# Patient Record
Sex: Male | Born: 1945 | Race: White | Hispanic: No | Marital: Single | Smoking: Former smoker
Health system: Southern US, Community
[De-identification: ages and names within clinical notes are randomized; demographics above are authoritative.]

## PROBLEM LIST (undated history)

## (undated) DIAGNOSIS — I2510 Atherosclerotic heart disease of native coronary artery without angina pectoris: Secondary | ICD-10-CM

## (undated) DIAGNOSIS — K5792 Diverticulitis of intestine, part unspecified, without perforation or abscess without bleeding: Secondary | ICD-10-CM

---

## 2009-03-04 ENCOUNTER — Encounter (INDEPENDENT_AMBULATORY_CARE_PROVIDER_SITE_OTHER): Payer: Self-pay | Admitting: *Deleted

## 2009-08-07 ENCOUNTER — Telehealth: Payer: Self-pay | Admitting: Internal Medicine

## 2010-07-21 NOTE — Progress Notes (Signed)
Summary: Schedule Colonoscopy  Phone Note Outgoing Call   Call placed by: Hortense Ramal CMA Duncan Dull),  August 07, 2009 3:35 PM Call placed to: Patient Summary of Call: Patient is due for a colonoscopy due to his previous history of hyperplastic colonic polyps as well as diverticulosis. I have attempted to contact the patient, however, the numbers provided in EMR and IDX are incorrect. We will send a letter.  Initial call taken by: Hortense Ramal CMA Duncan Dull),  August 07, 2009 3:38 PM

## 2012-01-04 ENCOUNTER — Encounter: Payer: Self-pay | Admitting: Internal Medicine

## 2013-04-06 DIAGNOSIS — E785 Hyperlipidemia, unspecified: Secondary | ICD-10-CM | POA: Diagnosis present

## 2014-01-29 ENCOUNTER — Encounter: Payer: Self-pay | Admitting: Internal Medicine

## 2014-01-31 ENCOUNTER — Encounter: Payer: Self-pay | Admitting: Internal Medicine

## 2014-02-27 ENCOUNTER — Encounter: Payer: Self-pay | Admitting: Gastroenterology

## 2014-03-19 ENCOUNTER — Ambulatory Visit (AMBULATORY_SURGERY_CENTER): Payer: Medicare Other | Admitting: *Deleted

## 2014-03-19 VITALS — Ht 69.0 in | Wt 181.2 lb

## 2014-03-19 DIAGNOSIS — Z1211 Encounter for screening for malignant neoplasm of colon: Secondary | ICD-10-CM

## 2014-03-19 NOTE — Progress Notes (Signed)
No allergies to eggs or soy. No prior anesthesia.  Pt given Emmi instructions for colonoscopy  No oxygen use  No diet drug use  

## 2014-03-28 ENCOUNTER — Encounter: Payer: Self-pay | Admitting: Internal Medicine

## 2014-03-28 ENCOUNTER — Ambulatory Visit (AMBULATORY_SURGERY_CENTER): Payer: Medicare Other | Admitting: Internal Medicine

## 2014-03-28 VITALS — BP 151/77 | HR 60 | Temp 96.9°F | Resp 20 | Ht 69.0 in | Wt 181.0 lb

## 2014-03-28 DIAGNOSIS — D122 Benign neoplasm of ascending colon: Secondary | ICD-10-CM

## 2014-03-28 DIAGNOSIS — D124 Benign neoplasm of descending colon: Secondary | ICD-10-CM

## 2014-03-28 DIAGNOSIS — D123 Benign neoplasm of transverse colon: Secondary | ICD-10-CM

## 2014-03-28 DIAGNOSIS — Z1211 Encounter for screening for malignant neoplasm of colon: Secondary | ICD-10-CM

## 2014-03-28 DIAGNOSIS — K635 Polyp of colon: Secondary | ICD-10-CM

## 2014-03-28 MED ORDER — SODIUM CHLORIDE 0.9 % IV SOLN: 500.0000 mL | INTRAVENOUS | Status: DC

## 2014-03-28 NOTE — Op Note (Signed)
[place]  [address] [place], [id number]   COLONOSCOPY PROCEDURE REPORT  PATIENT: Franklin Ward, Franklin Ward  MR#: [id number] BIRTHDATE: [birth year] , 89  yrs. old GENDER: male ENDOSCOPIST: Eustace Quail, MD REFERRED [id number] Kenton Kingfisher, M.D. PROCEDURE DATE:  03/28/2014 PROCEDURE:   Colonoscopy with snare polypectomy x4 First Screening Colonoscopy - Avg.  risk and is 50 yrs.  old or older - No.  Prior Negative Screening - Now for repeat screening. 10 or more years since last screening  History of Adenoma - Now for follow-up colonoscopy & has been > or = to 3 yrs.  N/A  Polyps Removed Today? Yes. ASA CLASS:   Class III INDICATIONS:average risk for colorectal cancer. Next exam 2005 negative (hyperplastic polyp only). MEDICATIONS: Monitored anesthesia care and Propofol 300 mg IV  DESCRIPTION OF PROCEDURE:   After the risks benefits and alternatives of the procedure were thoroughly explained, informed consent was obtained.  The digital rectal exam revealed no abnormalities of the rectum.   The LB [id number]  endoscope was introduced through the anus and advanced to the cecum, which was identified by both the appendix and ileocecal valve. No adverse events experienced.   The quality of the prep was good, using MoviPrep  The instrument was then slowly withdrawn as the colon was fully examined.  COLON FINDINGS: Four polyps ranging between 3-38mm in size were found in the descending colon, transverse (2), and ascending colon.  A polypectomy was performed with a cold snare.  The resection was complete, the polyp tissue was completely retrieved and sent to histology.   There was severe diverticulosis noted in the left colon.   The examination was otherwise normal.  Retroflexed views revealed internal hemorrhoids. The time to cecum=3 minutes 44 seconds.  Withdrawal time=14 minutes 20 seconds.  The scope was withdrawn and the procedure completed. COMPLICATIONS: There  were no immediate complications.  ENDOSCOPIC IMPRESSION: 1.   Four polyps were in the descending, transverse, and ascending colon; polypectomy was performed with a cold snare 2.   Severe diverticulosis was noted in the left colon 3.   The examination was otherwise normal  RECOMMENDATIONS: 1. Repeat Colonoscopy in 3 years (3 or more adenomas).  eSigned:  Eustace Quail, MD 03/28/2014 1:12 PM   cc: Shirline Frees MD and The Patient

## 2014-03-28 NOTE — Progress Notes (Addendum)
1334 pt. Stated that he is having trouble passing air Dr. Henrene Pastor at bedside hyoscyamine 0.125 mg give after repositioning and massaging abdomen.  1340. Pt. Expelled small amount of air,stated that it is cramping down in the bottom of abdomen, ambulated pt within unit and then taken to bathroom and wife with pt. Await response.  1406.ambulated pt. Within unit and coffee given, await response.pt. 1418 pt. And wife stated that he just took 2  Gas-x. Pt. Taken back to bathroom to attempt to expel air.pt. Abdomen soft and non distended. Asked pt. To rate abdominal discomfort at 1340,pt". Stated that he is unable to rate discomfort,you just know that it is there".   39 Spoke with Dr. Henrene Pastor and made him aware of all the above information including the information that pt. Stated that the last time he had this procedure he had difficulty expelling air and that he went home and took Gas-X and once he started expelling the air he was okay. Dr. Henrene Pastor instructed me to discharge pt. And inform him not to over eat and he could continue to take Gas-X at home to help. Pt. Verbalize understanding, also instructed pt. To call with any worsening of pain and swelling of abdomen.

## 2014-03-28 NOTE — Progress Notes (Signed)
Called to room to assist during endoscopic procedure.  Patient ID and intended procedure confirmed with present staff. Received instructions for my participation in the procedure from the performing physician.  

## 2014-03-28 NOTE — Progress Notes (Signed)
A/ox3 pleased with MAC, report to Sheila RN 

## 2014-03-28 NOTE — Patient Instructions (Signed)
YOU HAD AN ENDOSCOPIC PROCEDURE TODAY AT THE [facility]: Refer to the procedure report that was given to you for any specific questions about what was found during the examination.  If the procedure report does not answer your questions, please call your gastroenterologist to clarify.  If you requested that your care partner not be given the details of your procedure findings, then the procedure report has been included in a sealed envelope for you to review at your convenience later.  YOU SHOULD EXPECT: Some feelings of bloating in the abdomen. Passage of more gas than usual.  Walking can help get rid of the air that was put into your GI tract during the procedure and reduce the bloating. If you had a lower endoscopy (such as a colonoscopy or flexible sigmoidoscopy) you may notice spotting of blood in your stool or on the toilet paper. If you underwent a bowel prep for your procedure, then you may not have a normal bowel movement for a few days.  DIET: Your first meal following the procedure should be a light meal and then it is ok to progress to your normal diet.  A half-sandwich or bowl of soup is an example of a good first meal.  Heavy or fried foods are harder to digest and may make you feel nauseous or bloated.  Likewise meals heavy in dairy and vegetables can cause extra gas to form and this can also increase the bloating.  Drink plenty of fluids but you should avoid alcoholic beverages for 24 hours.  ACTIVITY: Your care partner should take you home directly after the procedure.  You should plan to take it easy, moving slowly for the rest of the day.  You can resume normal activity the day after the procedure however you should NOT DRIVE or use heavy machinery for 24 hours (because of the sedation medicines used during the test).    SYMPTOMS TO REPORT IMMEDIATELY: A gastroenterologist can be reached at any hour.  During normal business hours, 8:30 AM to 5:00 PM Monday through Friday,  call [phone].  After hours and on weekends, please call the GI answering service at [phone] who will take a message and have the physician on call contact you.   Following lower endoscopy (colonoscopy or flexible sigmoidoscopy):  Excessive amounts of blood in the stool  Significant tenderness or worsening of abdominal pains  Swelling of the abdomen that is new, acute  Fever of 100F or higher   FOLLOW UP: If any biopsies were taken you will be contacted by phone or by letter within the next 1-3 weeks.  Call your gastroenterologist if you have not heard about the biopsies in 3 weeks.  Our staff will call the home number listed on your records the next business day following your procedure to check on you and address any questions or concerns that you may have at that time regarding the information given to you following your procedure. This is a courtesy call and so if there is no answer at the home number and we have not heard from you through the emergency physician on call, we will assume that you have returned to your regular daily activities without incident.  SIGNATURES/CONFIDENTIALITY: You and/or your care partner have signed paperwork which will be entered into your electronic medical record.  These signatures attest to the fact that that the information above on your After Visit Summary has been reviewed and is understood.  Full responsibility of the confidentiality of   this discharge information lies with you and/or your care-partner.   Resume medications. Information given on polyps,diverticulosis and high fiber diet with discharge instructions. 

## 2014-03-29 ENCOUNTER — Telehealth: Payer: Self-pay

## 2014-03-29 NOTE — Telephone Encounter (Signed)
  Follow up Call-  Call back number 03/28/2014  Post procedure Call Back phone  # [phone]  Permission to leave phone message Yes     Patient questions:  Do you have a fever, pain , or abdominal swelling? No. Pain Score  0 *  Have you tolerated food without any problems? Yes.    Have you been able to return to your normal activities? Yes.    Do you have any questions about your discharge instructions: Diet   No. Medications  No. Follow up visit  No.  Do you have questions or concerns about your Care? No.  Actions: * If pain score is 4 or above: No action needed, pain <4.

## 2014-04-03 ENCOUNTER — Encounter: Payer: Self-pay | Admitting: Internal Medicine

## 2016-07-06 ENCOUNTER — Encounter (HOSPITAL_COMMUNITY): Payer: Self-pay

## 2016-07-06 ENCOUNTER — Ambulatory Visit
Admission: RE | Admit: 2016-07-06 | Discharge: 2016-07-06 | Disposition: A | Discharge status: Home / Self Care | Payer: Medicare Other | Source: Ambulatory Visit | Attending: Family Medicine | Admitting: Family Medicine

## 2016-07-06 ENCOUNTER — Other Ambulatory Visit: Payer: Self-pay | Admitting: Family Medicine

## 2016-07-06 ENCOUNTER — Inpatient Hospital Stay (HOSPITAL_COMMUNITY)
Admission: EM | Admit: 2016-07-06 | Discharge: 2016-07-10 | DRG: 392 | Disposition: A | Discharge status: Home / Self Care | Payer: Medicare Other | Attending: Surgery | Admitting: Surgery

## 2016-07-06 DIAGNOSIS — Z7982 Long term (current) use of aspirin: Secondary | ICD-10-CM

## 2016-07-06 DIAGNOSIS — K5720 Diverticulitis of large intestine with perforation and abscess without bleeding: Secondary | ICD-10-CM | POA: Diagnosis not present

## 2016-07-06 DIAGNOSIS — Z801 Family history of malignant neoplasm of trachea, bronchus and lung: Secondary | ICD-10-CM

## 2016-07-06 DIAGNOSIS — R1030 Lower abdominal pain, unspecified: Secondary | ICD-10-CM

## 2016-07-06 DIAGNOSIS — K5780 Diverticulitis of intestine, part unspecified, with perforation and abscess without bleeding: Secondary | ICD-10-CM | POA: Diagnosis present

## 2016-07-06 DIAGNOSIS — K5900 Constipation, unspecified: Secondary | ICD-10-CM | POA: Diagnosis present

## 2016-07-06 DIAGNOSIS — F17220 Nicotine dependence, chewing tobacco, uncomplicated: Secondary | ICD-10-CM | POA: Diagnosis present

## 2016-07-06 LAB — CBC WITH DIFFERENTIAL/PLATELET
Basophils Absolute: 0 10*3/uL (ref 0.0–0.1)
Basophils Relative: 0 %
EOS ABS: 0.1 10*3/uL (ref 0.0–0.7)
Eosinophils Relative: 1 %
HEMATOCRIT: 39.1 % (ref 39.0–52.0)
HEMOGLOBIN: 12.9 g/dL — AB (ref 13.0–17.0)
LYMPHS ABS: 2 10*3/uL (ref 0.7–4.0)
Lymphocytes Relative: 12 %
MCH: 29.4 pg (ref 26.0–34.0)
MCHC: 33 g/dL (ref 30.0–36.0)
MCV: 89.1 fL (ref 78.0–100.0)
MONOS PCT: 9 %
Monocytes Absolute: 1.4 10*3/uL — ABNORMAL HIGH (ref 0.1–1.0)
NEUTROS PCT: 78 %
Neutro Abs: 12.5 10*3/uL — ABNORMAL HIGH (ref 1.7–7.7)
Platelets: 372 10*3/uL (ref 150–400)
RBC: 4.39 MIL/uL (ref 4.22–5.81)
RDW: 13.6 % (ref 11.5–15.5)
WBC: 15.9 10*3/uL — ABNORMAL HIGH (ref 4.0–10.5)

## 2016-07-06 LAB — ABO/RH: ABO/RH(D): A POS

## 2016-07-06 LAB — COMPREHENSIVE METABOLIC PANEL
ALK PHOS: 52 U/L (ref 38–126)
ALT: 21 U/L (ref 17–63)
ANION GAP: 9 (ref 5–15)
AST: 25 U/L (ref 15–41)
Albumin: 2.7 g/dL — ABNORMAL LOW (ref 3.5–5.0)
BILIRUBIN TOTAL: 0.3 mg/dL (ref 0.3–1.2)
BUN: 13 mg/dL (ref 6–20)
CALCIUM: 9.2 mg/dL (ref 8.9–10.3)
CO2: 23 mmol/L (ref 22–32)
CREATININE: 0.8 mg/dL (ref 0.61–1.24)
Chloride: 102 mmol/L (ref 101–111)
Glucose, Bld: 108 mg/dL — ABNORMAL HIGH (ref 65–99)
Potassium: 4.5 mmol/L (ref 3.5–5.1)
SODIUM: 134 mmol/L — AB (ref 135–145)
Total Protein: 7.5 g/dL (ref 6.5–8.1)

## 2016-07-06 LAB — TYPE AND SCREEN
ABO/RH(D): A POS
Antibody Screen: NEGATIVE

## 2016-07-06 LAB — I-STAT CG4 LACTIC ACID, ED
LACTIC ACID, VENOUS: 2.02 mmol/L — AB (ref 0.5–1.9)
Lactic Acid, Venous: 0.85 mmol/L (ref 0.5–1.9)

## 2016-07-06 MED ORDER — SODIUM CHLORIDE 0.9 % IV BOLUS (SEPSIS)
1000.0000 mL | Freq: Once | INTRAVENOUS | Status: AC
  Administered 2016-07-06: 1000 mL via INTRAVENOUS

## 2016-07-06 MED ORDER — PIPERACILLIN-TAZOBACTAM 3.375 G IVPB
3.3750 g | Freq: Three times a day (TID) | INTRAVENOUS | Status: DC
  Administered 2016-07-07 – 2016-07-10 (×10): 3.375 g via INTRAVENOUS
  Filled 2016-07-06 (×12): qty 50

## 2016-07-06 MED ORDER — SODIUM CHLORIDE 0.9 % IV SOLN
Freq: Once | INTRAVENOUS | Status: AC
Start: 2016-07-06 — End: 2016-07-06
  Administered 2016-07-06: 18:00:00 via INTRAVENOUS

## 2016-07-06 MED ORDER — PIPERACILLIN-TAZOBACTAM 3.375 G IVPB 30 MIN
3.3750 g | Freq: Once | INTRAVENOUS | Status: AC
  Administered 2016-07-06: 3.375 g via INTRAVENOUS
  Filled 2016-07-06: qty 50

## 2016-07-06 MED ORDER — IOPAMIDOL (ISOVUE-300) INJECTION 61%
100.0000 mL | Freq: Once | INTRAVENOUS | Status: AC | PRN
  Administered 2016-07-06: 100 mL via INTRAVENOUS

## 2016-07-06 NOTE — H&P (Signed)
Franklin Ward is an 71 y.o. male.   Chief Complaint: Lower abdominal pain HPI: This is a 71 yo male in good health who presents with a six week history of difficulty with bowel movements, followed by a couple of weeks of diarrhea.  He has felt a lot of pressure in his lower abdomen that is relieved by bowel movements.  His bowel movements have been fairly small over the last couple of week.  No melena or hematochezia.  Some subjective fever/ chills.  No nausea or vomiting.  PCP ordered CT scan which showed diverticulitis with abscess so the patient was instructed to go to the ED.  History reviewed. No pertinent past medical history.  Past Surgical History:  Procedure Laterality Date  . NO PAST SURGERIES      Family History  Problem Relation Age of Onset  . Lung cancer Father   . Colon cancer Neg Hx    Social History:  reports that he quit smoking about 36 years ago. His smokeless tobacco use includes Chew. He reports that he does not drink alcohol or use drugs.  Allergies:  Allergies  Allergen Reactions  . Tetracyclines & Related Rash    Prior to Admission medications   Medication Sig Start Date End Date Taking? Authorizing Provider  Ascorbic Acid (VITAMIN C PO) Take 1,000 mg by mouth daily.   Yes Historical Provider, MD  aspirin 81 MG tablet Take 81 mg by mouth daily.   Yes Historical Provider, MD  Calcium Carb-Cholecalciferol (CALCIUM 600 + D PO) Take 1 tablet by mouth daily.    Yes Historical Provider, MD  ciprofloxacin (CIPRO) 500 MG tablet Take 500 mg by mouth 2 (two) times daily. 07/06/16  Yes Historical Provider, MD  Cyanocobalamin (B-12) 2500 MCG TABS Take 1 tablet by mouth daily.    Yes Historical Provider, MD  Garlic 0258 MG CAPS Take 1 capsule by mouth daily.    Yes Historical Provider, MD  metroNIDAZOLE (FLAGYL) 500 MG tablet Take 500 mg by mouth 2 (two) times daily. 07/06/16  Yes Historical Provider, MD  Misc Natural Products (OSTEO BI-FLEX ADV TRIPLE ST PO) Take 1  tablet by mouth 2 (two) times daily.    Yes Historical Provider, MD  Multiple Vitamin (MULTIVITAMIN) tablet Take 1 tablet by mouth daily.   Yes Historical Provider, MD  Omega-3 Fatty Acids (OMEGA-3 FISH OIL PO) Take 1 capsule by mouth daily.   Yes Historical Provider, MD  pseudoephedrine-guaifenesin (MUCINEX D) 60-600 MG 12 hr tablet Take 1 tablet by mouth every 12 (twelve) hours as needed for congestion.   Yes Historical Provider, MD  Psyllium (METAMUCIL FIBER PO) Take by mouth See admin instructions. Mix 1 teaspoonful of powder into a glass of water and drink two to three times a day   Yes Historical Provider, MD  Turmeric 500 MG CAPS Take 500 mg by mouth daily.    Yes Historical Provider, MD  vitamin E 400 UNIT capsule Take 400 Units by mouth daily.   Yes Historical Provider, MD     Results for orders placed or performed during the hospital encounter of 07/06/16 (from the past 48 hour(s))  CBC with Differential     Status: Abnormal   Collection Time: 07/06/16  5:22 PM  Result Value Ref Range   WBC 15.9 (H) 4.0 - 10.5 K/uL   RBC 4.39 4.22 - 5.81 MIL/uL   Hemoglobin 12.9 (L) 13.0 - 17.0 g/dL   HCT 39.1 39.0 - 52.0 %   MCV  89.1 78.0 - 100.0 fL   MCH 29.4 26.0 - 34.0 pg   MCHC 33.0 30.0 - 36.0 g/dL   RDW 13.6 11.5 - 15.5 %   Platelets 372 150 - 400 K/uL   Neutrophils Relative % 78 %   Neutro Abs 12.5 (H) 1.7 - 7.7 K/uL   Lymphocytes Relative 12 %   Lymphs Abs 2.0 0.7 - 4.0 K/uL   Monocytes Relative 9 %   Monocytes Absolute 1.4 (H) 0.1 - 1.0 K/uL   Eosinophils Relative 1 %   Eosinophils Absolute 0.1 0.0 - 0.7 K/uL   Basophils Relative 0 %   Basophils Absolute 0.0 0.0 - 0.1 K/uL  Comprehensive metabolic panel     Status: Abnormal   Collection Time: 07/06/16  5:22 PM  Result Value Ref Range   Sodium 134 (L) 135 - 145 mmol/L   Potassium 4.5 3.5 - 5.1 mmol/L   Chloride 102 101 - 111 mmol/L   CO2 23 22 - 32 mmol/L   Glucose, Bld 108 (H) 65 - 99 mg/dL   BUN 13 6 - 20 mg/dL    Creatinine, Ser 0.80 0.61 - 1.24 mg/dL   Calcium 9.2 8.9 - 10.3 mg/dL   Total Protein 7.5 6.5 - 8.1 g/dL   Albumin 2.7 (L) 3.5 - 5.0 g/dL   AST 25 15 - 41 U/L   ALT 21 17 - 63 U/L   Alkaline Phosphatase 52 38 - 126 U/L   Total Bilirubin 0.3 0.3 - 1.2 mg/dL   GFR calc non Af Amer >60 >60 mL/min   GFR calc Af Amer >60 >60 mL/min    Comment: (NOTE) The eGFR has been calculated using the CKD EPI equation. This calculation has not been validated in all clinical situations. eGFR's persistently <60 mL/min signify possible Chronic Kidney Disease.    Anion gap 9 5 - 15  Type and screen [place]     Status: None   Collection Time: 07/06/16  5:22 PM  Result Value Ref Range   ABO/RH(D) A POS    Antibody Screen NEG    Sample Expiration 07/09/2016   ABO/Rh     Status: None   Collection Time: 07/06/16  5:22 PM  Result Value Ref Range   ABO/RH(D) A POS   I-Stat CG4 Lactic Acid, ED     Status: Abnormal   Collection Time: 07/06/16  5:44 PM  Result Value Ref Range   Lactic Acid, Venous 2.02 (HH) 0.5 - 1.9 mmol/L   Comment NOTIFIED PHYSICIAN    Ct Abdomen Pelvis W Contrast  Result Date: 07/06/2016 CLINICAL DATA:  71 year old male with lower abdominal pain most severe on the left side for the past 6 weeks. EXAM: CT ABDOMEN AND PELVIS WITH CONTRAST TECHNIQUE: Multidetector CT imaging of the abdomen and pelvis was performed using the standard protocol following bolus administration of intravenous contrast. CONTRAST:  140m ISOVUE-300 IOPAMIDOL (ISOVUE-300) INJECTION 61% COMPARISON:  No priors. FINDINGS: Lower chest: Atherosclerotic calcifications in the left anterior descending, left circumflex and right coronary arteries. Hepatobiliary: Multiple subcentimeter low-attenuation lesions are noted in the liver, too small to characterize, but statistically likely to represent tiny cysts. No larger more suspicious appearing hepatic lesions are noted. No intra or extrahepatic biliary ductal  dilatation. Gallbladder is normal in appearance. Pancreas: No pancreatic mass. No pancreatic ductal dilatation. No pancreatic or peripancreatic fluid or inflammatory changes. Spleen: Unremarkable. Adrenals/Urinary Tract: In the lower pole collecting system of the left kidney there is a 7 mm  nonobstructive calculus. No additional calculi are noted along the course of either ureter or within the lumen of the urinary bladder. There is no hydroureteronephrosis to indicate urinary tract obstruction at this time. Multiple subcentimeter low-attenuation lesions in the kidneys bilaterally are too small to characterize, but statistically likely to represent cysts. Urinary bladder is nearly completely decompressed, but otherwise unremarkable in appearance. Bilateral adrenal glands are normal in appearance. Stomach/Bowel: The appearance of the stomach is normal. There is no pathologic dilatation of small bowel or colon. 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 Lateral to the mid sigmoid colon on the right side there is a 2.1 x 3.1 x 4.6 cm centrally low-intermediate attenuation rim enhancing fluid collection, concerning for a diverticular abscess (axial image 68 of series 2 and coronal image 55 of series 3). The appendix is not confidently identified and may be surgically absent. Regardless, there are no inflammatory changes noted adjacent to the cecum to suggest the presence of an acute appendicitis at this time. Vascular/Lymphatic: Aortic atherosclerosis, without evidence of aneurysm or dissection in the abdominal or pelvic vasculature. No lymphadenopathy noted in the abdomen or pelvis. Reproductive: Prostate gland and seminal vesicles are unremarkable in appearance. Other: Small volume of free fluid in the low anatomic pelvis. No pneumoperitoneum. Musculoskeletal:  There are no aggressive appearing lytic or blastic lesions noted in the visualized portions of the skeleton. IMPRESSION: 1. Acute sigmoid diverticulitis with possible diverticular abscess in the sigmoid mesocolon, as detailed above. There is also small volume of free fluid in the low anatomic pelvis. The possibility of frank perforation is not excluded, but is not strongly favored at this time. Surgical consultation is recommended. 2. Aortic atherosclerosis, in addition to 3 vessel coronary artery disease. Assessment for potential risk factor modification, dietary therapy or pharmacologic therapy may be warranted, if clinically indicated. 3. 7 mm nonobstructive calculus in the lower pole collecting system of left kidney. No ureteral stones or findings to suggest urinary tract obstruction at this time. 4. Additional incidental findings, as above. These results will be called to the ordering clinician or representative by the Radiologist Assistant, and communication documented in the PACS or zVision Dashboard. Electronically Signed   By: Vinnie Langton M.D.   On: 07/06/2016 13:28    Review of Systems  Constitutional: Negative for weight loss.  HENT: Negative for ear discharge, ear pain, hearing loss and tinnitus.   Eyes: Negative for blurred vision, double vision, photophobia and pain.  Respiratory: Negative for cough, sputum production and shortness of breath.   Cardiovascular: Negative for chest pain.  Gastrointestinal: Positive for abdominal pain, constipation and diarrhea. Negative for nausea and vomiting.  Genitourinary: Negative for dysuria, flank pain, frequency and urgency.  Musculoskeletal: Negative for back pain, falls, joint pain, myalgias and neck pain.  Neurological: Negative for dizziness, tingling, sensory change, focal weakness, loss of consciousness and headaches.  Endo/Heme/Allergies: Does not bruise/bleed easily.  Psychiatric/Behavioral: Negative for depression, memory loss and  substance abuse. The patient is not nervous/anxious.     Blood pressure 131/69, pulse 92, temperature 97.9 F (36.6 C), temperature source Oral, resp. rate 18, height '5\' 9"'$  (1.753 m), weight 84.8 kg (187 lb), SpO2 98 %. Physical Exam  WDWN in NAD Eyes:  Pupils equal, round; sclera anicteric HENT:  Oral mucosa moist; good dentition  Neck:  No masses palpated, no thyromegaly Lungs:  CTA bilaterally; normal  respiratory effort CV:  Regular rate and rhythm; no murmurs; extremities well-perfused with no edema Abd:  +bowel sounds,mildly distended; tender in bilateral lower quadrants; no palpable masses Skin:  Warm, dry; no sign of jaundice Psychiatric - alert and oriented x 4; calm mood and affect  Assessment/Plan Sigmoid diverticulitis with abscess in the sigmoid mesocolon  Admit for IV abx, bowel rest Discussed the plan with the patient, including the possible need for emergent surgery if he worsens, including the possibility of temporary colostomy.  His family is also present.    Maia Petties., MD 07/06/2016, 7:03 PM

## 2016-07-06 NOTE — Progress Notes (Signed)
Pharmacy Antibiotic Note  Franklin Ward is a 71 y.o. male admitted on 07/06/2016 with perforated diverticulitis.  Pharmacy has been consulted for zosyn dosing.  Patient received zosyn 3.375g IV once in the ED.  Plan: Zosyn 3.375g IV q8h (4 hour infusion).  Monitor culture data, renal function and clinical course  Height: 5\' 9"  (175.3 cm) Weight: 187 lb (84.8 kg) IBW/kg (Calculated) : 70.7  Temp (24hrs), Avg:97.9 F (36.6 C), Min:97.9 F (36.6 C), Max:97.9 F (36.6 C)  No results for input(s): WBC, CREATININE, LATICACIDVEN, VANCOTROUGH, VANCOPEAK, VANCORANDOM, GENTTROUGH, GENTPEAK, GENTRANDOM, TOBRATROUGH, TOBRAPEAK, TOBRARND, AMIKACINPEAK, AMIKACINTROU, AMIKACIN in the last 168 hours.  CrCl cannot be calculated (No order found.).    Allergies  Allergen Reactions  . Tetracyclines & Related Rash     Andrey Cota. Diona Foley, PharmD,  Clinical Pharmacist Pager [phone] 07/06/2016 4:40 PM

## 2016-07-06 NOTE — ED Notes (Signed)
Pt ambulated to the BR with steady gait.  Still waiting for a bed assignment

## 2016-07-06 NOTE — ED Notes (Signed)
Pt is resting comfortably with family at bedside.  Denies any complaints at this time.  Waiting for a bed assignment.

## 2016-07-06 NOTE — ED Notes (Signed)
Pt is resting with family at bedside.  Reason for delay explained to pt.  Pt ambulated to the BR with steady gait.

## 2016-07-06 NOTE — ED Triage Notes (Addendum)
Pt. Was sent to [place] by [facility] Dr. Kenton Kingfisher, with an abscess of diverticulum.  Pt. Having soft stool. Denies any pain.  These symptoms began over 6 weeks ago.  Pt. Does have cold sweats at night.  Alert and oriented X4.  Skin is warm and pink and dry. Pt. Was placed on antibiotics today.  He was sent by [facility] to see a Psychologist, sport and exercise.

## 2016-07-06 NOTE — ED Provider Notes (Signed)
[facility] [place] DEPT Provider Note   CSN: [id number] Arrival date & time: 07/06/16  1524     History   Chief Complaint Chief Complaint  Patient presents with  . Abnormal Lab    HPI Franklin Ward is a 71 y.o. male.  HPI 71 year old male with no significant past medical history here with lower abdominal pain. Patient states the last 6 weeks he has had aching, gnawing, lower abdominal pain. His symptoms started initially with constipation followed by profuse, watery diarrhea. Over the last several days his pain is worsened and he is having increasing urge to go to the bathroom although he only produces a small amount of stool. Denies any blood in stool. He has had some subjective fevers and chills. No vomiting. He went to his primary care doctor today who sent him to a CT scan which showed perforated diverticulitis. He was subsequently sent here for evaluation.  History reviewed. No pertinent past medical history.  There are no active problems to display for this patient.   Past Surgical History:  Procedure Laterality Date  . NO PAST SURGERIES         Home Medications    Prior to Admission medications   Medication Sig Start Date End Date Taking? Authorizing Provider  Ascorbic Acid (VITAMIN C PO) Take 1,000 mg by mouth daily.   Yes Historical Provider, MD  aspirin 81 MG tablet Take 81 mg by mouth daily.   Yes Historical Provider, MD  Calcium Carb-Cholecalciferol (CALCIUM 600 + D PO) Take 1 tablet by mouth daily.    Yes Historical Provider, MD  ciprofloxacin (CIPRO) 500 MG tablet Take 500 mg by mouth 2 (two) times daily. 07/06/16  Yes Historical Provider, MD  Cyanocobalamin (B-12) 2500 MCG TABS Take 1 tablet by mouth daily.    Yes Historical Provider, MD  Garlic 123XX123 MG CAPS Take 1 capsule by mouth daily.    Yes Historical Provider, MD  metroNIDAZOLE (FLAGYL) 500 MG tablet Take 500 mg by mouth 2 (two) times daily. 07/06/16  Yes Historical Provider, MD  Misc Natural Products  (OSTEO BI-FLEX ADV TRIPLE ST PO) Take 1 tablet by mouth 2 (two) times daily.    Yes Historical Provider, MD  Multiple Vitamin (MULTIVITAMIN) tablet Take 1 tablet by mouth daily.   Yes Historical Provider, MD  Omega-3 Fatty Acids (OMEGA-3 FISH OIL PO) Take 1 capsule by mouth daily.   Yes Historical Provider, MD  pseudoephedrine-guaifenesin (MUCINEX D) 60-600 MG 12 hr tablet Take 1 tablet by mouth every 12 (twelve) hours as needed for congestion.   Yes Historical Provider, MD  Psyllium (METAMUCIL FIBER PO) Take by mouth See admin instructions. Mix 1 teaspoonful of powder into a glass of water and drink two to three times a day   Yes Historical Provider, MD  Turmeric 500 MG CAPS Take 500 mg by mouth daily.    Yes Historical Provider, MD  vitamin E 400 UNIT capsule Take 400 Units by mouth daily.   Yes Historical Provider, MD    Family History Family History  Problem Relation Age of Onset  . Lung cancer Father   . Colon cancer Neg Hx     Social History Social History  Substance Use Topics  . Smoking status: Former Smoker    Quit date: 06/21/1980  . Smokeless tobacco: Current User    Types: Chew  . Alcohol use No     Allergies   Tetracyclines & related   Review of Systems Review of Systems  Constitutional:  Positive for fatigue. Negative for chills and fever.  HENT: Negative for congestion and rhinorrhea.   Eyes: Negative for visual disturbance.  Respiratory: Negative for cough, shortness of breath and wheezing.   Cardiovascular: Negative for chest pain and leg swelling.  Gastrointestinal: Positive for abdominal pain and diarrhea. Negative for nausea and vomiting.  Genitourinary: Negative for dysuria and flank pain.  Musculoskeletal: Negative for neck pain and neck stiffness.  Skin: Negative for rash and wound.  Allergic/Immunologic: Negative for immunocompromised state.  Neurological: Negative for syncope, weakness and headaches.  All other systems reviewed and are  negative.    Physical Exam Updated Vital Signs BP 142/83   Pulse 89   Temp 97.9 F (36.6 C) (Oral)   Resp 18   Ht 5\' 9"  (1.753 m)   Wt 187 lb (84.8 kg)   SpO2 95%   BMI 27.62 kg/m   Physical Exam  Constitutional: He is oriented to person, place, and time. He appears well-developed and well-nourished. No distress.  HENT:  Head: Normocephalic and atraumatic.  Mouth/Throat: Oropharynx is clear and moist. No oropharyngeal exudate.  Eyes: Conjunctivae are normal.  Neck: Neck supple.  Cardiovascular: Normal rate, regular rhythm and normal heart sounds.  Exam reveals no friction rub.   No murmur heard. Pulmonary/Chest: Effort normal and breath sounds normal. No respiratory distress. He has no wheezes. He has no rales.  Abdominal: He exhibits no distension. There is tenderness in the right lower quadrant, suprapubic area and left lower quadrant. There is no rigidity and no guarding.  Musculoskeletal: He exhibits no edema.  Neurological: He is alert and oriented to person, place, and time. He exhibits normal muscle tone.  Skin: Skin is warm. Capillary refill takes less than 2 seconds.  Psychiatric: He has a normal mood and affect.  Nursing note and vitals reviewed.    ED Treatments / Results  Labs (all labs ordered are listed, but only abnormal results are displayed) Labs Reviewed  CBC WITH DIFFERENTIAL/PLATELET - Abnormal; Notable for the following:       Result Value   WBC 15.9 (*)    Hemoglobin 12.9 (*)    Neutro Abs 12.5 (*)    Monocytes Absolute 1.4 (*)    All other components within normal limits  COMPREHENSIVE METABOLIC PANEL - Abnormal; Notable for the following:    Sodium 134 (*)    Glucose, Bld 108 (*)    Albumin 2.7 (*)    All other components within normal limits  I-STAT CG4 LACTIC ACID, ED - Abnormal; Notable for the following:    Lactic Acid, Venous 2.02 (*)    All other components within normal limits  CULTURE, BLOOD (ROUTINE X 2)  CULTURE, BLOOD (ROUTINE  X 2)  TYPE AND SCREEN    EKG  EKG Interpretation None       Radiology Ct Abdomen Pelvis W Contrast  Result Date: 07/06/2016 CLINICAL DATA:  71 year old male with lower abdominal pain most severe on the left side for the past 6 weeks. EXAM: CT ABDOMEN AND PELVIS WITH CONTRAST TECHNIQUE: Multidetector CT imaging of the abdomen and pelvis was performed using the standard protocol following bolus administration of intravenous contrast. CONTRAST:  173mL ISOVUE-300 IOPAMIDOL (ISOVUE-300) INJECTION 61% COMPARISON:  No priors. FINDINGS: Lower chest: Atherosclerotic calcifications in the left anterior descending, left circumflex and right coronary arteries. Hepatobiliary: Multiple subcentimeter low-attenuation lesions are noted in the liver, too small to characterize, but statistically likely to represent tiny cysts. No larger more suspicious appearing hepatic lesions  are noted. No intra or extrahepatic biliary ductal dilatation. Gallbladder is normal in appearance. Pancreas: No pancreatic mass. No pancreatic ductal dilatation. No pancreatic or peripancreatic fluid or inflammatory changes. Spleen: Unremarkable. Adrenals/Urinary Tract: In the lower pole collecting system of the left kidney there is a 7 mm nonobstructive calculus. No additional calculi are noted along the course of either ureter or within the lumen of the urinary bladder. There is no hydroureteronephrosis to indicate urinary tract obstruction at this time. Multiple subcentimeter low-attenuation lesions in the kidneys bilaterally are too small to characterize, but statistically likely to represent cysts. Urinary bladder is nearly completely decompressed, but otherwise unremarkable in appearance. Bilateral adrenal glands are normal in appearance. Stomach/Bowel: The appearance of the stomach is normal. There is no pathologic dilatation of small bowel or colon. 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 Lateral to the mid sigmoid colon on the right side there is a 2.1 x 3.1 x 4.6 cm centrally low-intermediate attenuation rim enhancing fluid collection, concerning for a diverticular abscess (axial image 68 of series 2 and coronal image 55 of series 3). The appendix is not confidently identified and may be surgically absent. Regardless, there are no inflammatory changes noted adjacent to the cecum to suggest the presence of an acute appendicitis at this time. Vascular/Lymphatic: Aortic atherosclerosis, without evidence of aneurysm or dissection in the abdominal or pelvic vasculature. No lymphadenopathy noted in the abdomen or pelvis. Reproductive: Prostate gland and seminal vesicles are unremarkable in appearance. Other: Small volume of free fluid in the low anatomic pelvis. No pneumoperitoneum. Musculoskeletal: There are no aggressive appearing lytic or blastic lesions noted in the visualized portions of the skeleton. IMPRESSION: 1. Acute sigmoid diverticulitis with possible diverticular abscess in the sigmoid mesocolon, as detailed above. There is also small volume of free fluid in the low anatomic pelvis. The possibility of frank perforation is not excluded, but is not strongly favored at this time. Surgical consultation is recommended. 2. Aortic atherosclerosis, in addition to 3 vessel coronary artery disease. Assessment for potential risk factor modification, dietary therapy or pharmacologic therapy may be warranted, if clinically indicated. 3. 7 mm nonobstructive calculus in the lower pole collecting system of left kidney. No ureteral stones or findings to suggest urinary tract obstruction at this time. 4. Additional incidental findings, as above. These results will be called to the ordering clinician or representative by the Radiologist Assistant, and communication documented in the PACS or zVision  Dashboard. Electronically Signed   By: Vinnie Langton M.D.   On: 07/06/2016 13:28    Procedures Procedures (including critical care time)  Medications Ordered in ED Medications  piperacillin-tazobactam (ZOSYN) IVPB 3.375 g (not administered)  sodium chloride 0.9 % bolus 1,000 mL (1,000 mLs Intravenous New Bag/Given 07/06/16 1723)  0.9 %  sodium chloride infusion ( Intravenous New Bag/Given 07/06/16 1730)     Initial Impression / Assessment and Plan / ED Course  I have reviewed the triage vital signs and the nursing notes.  Pertinent labs & imaging results that were available during my care of the patient were reviewed by me and considered in my medical decision making (see chart for details).  Clinical Course     71 year old male sent from PCP for perforated diverticulitis. Patient given IV Zosyn. Labwork overall reassuring without evidence of severe sepsis. Consult to surgery who will admit.  Final Clinical Impressions(s) /  ED Diagnoses   Final diagnoses:  Perforated diverticulum  Diverticulitis of large intestine with abscess without bleeding      Duffy Bruce, MD 07/06/16 [phone]

## 2016-07-07 LAB — BASIC METABOLIC PANEL
ANION GAP: 9 (ref 5–15)
BUN: 11 mg/dL (ref 6–20)
CALCIUM: 8.5 mg/dL — AB (ref 8.9–10.3)
CO2: 23 mmol/L (ref 22–32)
Chloride: 103 mmol/L (ref 101–111)
Creatinine, Ser: 0.86 mg/dL (ref 0.61–1.24)
GFR calc Af Amer: >60 mL/min (ref >60)
GLUCOSE: 95 mg/dL (ref 65–99)
Potassium: 4.1 mmol/L (ref 3.5–5.1)
Sodium: 135 mmol/L (ref 135–145)

## 2016-07-07 LAB — CBC
HCT: 37.8 % — ABNORMAL LOW (ref 39.0–52.0)
Hemoglobin: 12.3 g/dL — ABNORMAL LOW (ref 13.0–17.0)
MCH: 29 pg (ref 26.0–34.0)
MCHC: 32.5 g/dL (ref 30.0–36.0)
MCV: 89.2 fL (ref 78.0–100.0)
PLATELETS: 369 10*3/uL (ref 150–400)
RBC: 4.24 MIL/uL (ref 4.22–5.81)
RDW: 13.8 % (ref 11.5–15.5)
WBC: 18.4 10*3/uL — ABNORMAL HIGH (ref 4.0–10.5)

## 2016-07-07 MED ORDER — DIPHENHYDRAMINE HCL 50 MG/ML IJ SOLN: 12.5000 mg | Freq: Four times a day (QID) | INTRAMUSCULAR | Status: DC | PRN

## 2016-07-07 MED ORDER — ONDANSETRON HCL 4 MG/2ML IJ SOLN: 4.0000 mg | Freq: Four times a day (QID) | INTRAMUSCULAR | Status: DC | PRN

## 2016-07-07 MED ORDER — ENOXAPARIN SODIUM 40 MG/0.4ML ~~LOC~~ SOLN
40.0000 mg | SUBCUTANEOUS | Status: DC
  Administered 2016-07-07 – 2016-07-09 (×3): 40 mg via SUBCUTANEOUS
  Filled 2016-07-07 (×3): qty 0.4

## 2016-07-07 MED ORDER — MORPHINE SULFATE (PF) 2 MG/ML IV SOLN: 2.0000 mg | INTRAVENOUS | Status: DC | PRN

## 2016-07-07 MED ORDER — ZOLPIDEM TARTRATE 5 MG PO TABS: 5.0000 mg | ORAL_TABLET | Freq: Every evening | ORAL | Status: DC | PRN

## 2016-07-07 MED ORDER — POTASSIUM CHLORIDE IN NACL 20-0.9 MEQ/L-% IV SOLN
INTRAVENOUS | Status: DC
  Administered 2016-07-07 – 2016-07-08 (×5): via INTRAVENOUS
  Filled 2016-07-07 (×5): qty 1000

## 2016-07-07 MED ORDER — ONDANSETRON 4 MG PO TBDP: 4.0000 mg | ORAL_TABLET | Freq: Four times a day (QID) | ORAL | Status: DC | PRN

## 2016-07-07 MED ORDER — DIPHENHYDRAMINE HCL 12.5 MG/5ML PO ELIX: 12.5000 mg | ORAL_SOLUTION | Freq: Four times a day (QID) | ORAL | Status: DC | PRN

## 2016-07-07 NOTE — ED Notes (Signed)
Patient ambulatory to the restroom without difficulty.

## 2016-07-07 NOTE — Progress Notes (Signed)
[facility] Progress Note     Subjective: Pt with minimal abdominal pain. States he has had numerous BM's today. No blood in his stool. He states worsening abdominal pain before and during a BM then pain is relieved with a BM. He is not taking any pain medications. He denies nausea, vomiting, fevers.   Objective: Vital signs in last 24 hours: Temp:  [97.9 F (36.6 C)-99.1 F (37.3 C)] 97.9 F (36.6 C) (01/17 0621) Pulse Rate:  [85-104] 85 (01/17 0621) Resp:  [18] 18 (01/17 0621) BP: (127-160)/(61-92) 156/85 (01/17 0621) SpO2:  [94 %-99 %] 96 % (01/17 0621) Weight:  [187 lb (84.8 kg)-188 lb 14.4 oz (85.7 kg)] 188 lb 14.4 oz (85.7 kg) (01/17 0145) Last BM Date: 07/07/16  Intake/Output from previous day: 01/16 0701 - 01/17 0700 In: 1225 [I.V.:125; IV Piggyback:1100] Out: -  Intake/Output this shift: No intake/output data recorded.  PE: Gen:  Alert, NAD, pleasant, cooperative, lying in bed Card:  RRR, no M/G/R heard, 2 + radial pulses bilaterally Pulm:  CTA anteriorly, no W/R/R, effort and rate normal Abd: Soft, non distended, +BS, mild suprapubic tenderness, no abdominal scars noted Skin: no rashes noted, warm and dry  Lab Results:   Recent Labs  07/06/16 1722 07/07/16 0501  WBC 15.9* 18.4*  HGB 12.9* 12.3*  HCT 39.1 37.8*  PLT 372 369   BMET  Recent Labs  07/06/16 1722 07/07/16 0501  NA 134* 135  K 4.5 4.1  CL 102 103  CO2 23 23  GLUCOSE 108* 95  BUN 13 11  CREATININE 0.80 0.86  CALCIUM 9.2 8.5*   PT/INR No results for input(s): LABPROT, INR in the last 72 hours. CMP     Component Value Date/Time   NA 135 07/07/2016 0501   K 4.1 07/07/2016 0501   CL 103 07/07/2016 0501   CO2 23 07/07/2016 0501   GLUCOSE 95 07/07/2016 0501   BUN 11 07/07/2016 0501   CREATININE 0.86 07/07/2016 0501   CALCIUM 8.5 (L) 07/07/2016 0501   PROT 7.5 07/06/2016 1722   ALBUMIN 2.7 (L) 07/06/2016 1722   AST 25 07/06/2016 1722   ALT 21 07/06/2016 1722    ALKPHOS 52 07/06/2016 1722   BILITOT 0.3 07/06/2016 1722   GFRNONAA >60 07/07/2016 0501   GFRAA >60 07/07/2016 0501   Lipase  No results found for: LIPASE     Studies/Results: Ct Abdomen Pelvis W Contrast  Result Date: 07/06/2016 CLINICAL DATA:  71 year old male with lower abdominal pain most severe on the left side for the past 6 weeks. EXAM: CT ABDOMEN AND PELVIS WITH CONTRAST TECHNIQUE: Multidetector CT imaging of the abdomen and pelvis was performed using the standard protocol following bolus administration of intravenous contrast. CONTRAST:  161mL ISOVUE-300 IOPAMIDOL (ISOVUE-300) INJECTION 61% COMPARISON:  No priors. FINDINGS: Lower chest: Atherosclerotic calcifications in the left anterior descending, left circumflex and right coronary arteries. Hepatobiliary: Multiple subcentimeter low-attenuation lesions are noted in the liver, too small to characterize, but statistically likely to represent tiny cysts. No larger more suspicious appearing hepatic lesions are noted. No intra or extrahepatic biliary ductal dilatation. Gallbladder is normal in appearance. Pancreas: No pancreatic mass. No pancreatic ductal dilatation. No pancreatic or peripancreatic fluid or inflammatory changes. Spleen: Unremarkable. Adrenals/Urinary Tract: In the lower pole collecting system of the left kidney there is a 7 mm nonobstructive calculus. No additional calculi are noted along the course of either ureter or within the lumen of the urinary bladder. There is no hydroureteronephrosis to  indicate urinary tract obstruction at this time. Multiple subcentimeter low-attenuation lesions in the kidneys bilaterally are too small to characterize, but statistically likely to represent cysts. Urinary bladder is nearly completely decompressed, but otherwise unremarkable in appearance. Bilateral adrenal glands are normal in appearance. Stomach/Bowel: The appearance of the stomach is normal. There is no pathologic dilatation of small  bowel or colon. 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 Lateral to the mid sigmoid colon on the right side there is a 2.1 x 3.1 x 4.6 cm centrally low-intermediate attenuation rim enhancing fluid collection, concerning for a diverticular abscess (axial image 68 of series 2 and coronal image 55 of series 3). The appendix is not confidently identified and may be surgically absent. Regardless, there are no inflammatory changes noted adjacent to the cecum to suggest the presence of an acute appendicitis at this time. Vascular/Lymphatic: Aortic atherosclerosis, without evidence of aneurysm or dissection in the abdominal or pelvic vasculature. No lymphadenopathy noted in the abdomen or pelvis. Reproductive: Prostate gland and seminal vesicles are unremarkable in appearance. Other: Small volume of free fluid in the low anatomic pelvis. No pneumoperitoneum. Musculoskeletal: There are no aggressive appearing lytic or blastic lesions noted in the visualized portions of the skeleton. IMPRESSION: 1. Acute sigmoid diverticulitis with possible diverticular abscess in the sigmoid mesocolon, as detailed above. There is also small volume of free fluid in the low anatomic pelvis. The possibility of frank perforation is not excluded, but is not strongly favored at this time. Surgical consultation is recommended. 2. Aortic atherosclerosis, in addition to 3 vessel coronary artery disease. Assessment for potential risk factor modification, dietary therapy or pharmacologic therapy may be warranted, if clinically indicated. 3. 7 mm nonobstructive calculus in the lower pole collecting system of left kidney. No ureteral stones or findings to suggest urinary tract obstruction at this time. 4. Additional incidental findings, as above. These results will be called to the ordering  clinician or representative by the Radiologist Assistant, and communication documented in the PACS or zVision Dashboard. Electronically Signed   By: Vinnie Langton M.D.   On: 07/06/2016 13:28    Anti-infectives: Anti-infectives    Start     Dose/Rate Route Frequency Ordered Stop   07/06/16 2330  piperacillin-tazobactam (ZOSYN) IVPB 3.375 g     3.375 g 12.5 mL/hr over 240 Minutes Intravenous Every 8 hours 07/06/16 1904     07/06/16 1700  piperacillin-tazobactam (ZOSYN) IVPB 3.375 g     3.375 g 100 mL/hr over 30 Minutes Intravenous  Once 07/06/16 1640 07/06/16 1804       Assessment/Plan  Sigmoid diverticulitis with abscess in the sigmoid mesocolon  - IV abx, bowel rest - pt is having minimal pain. Hope that this resolves with antibiotics and surgery can be avoided   - If pt's pain does not increase tomorrow can advance to clears - AM labs  FEN: NPO, sips and chips VTE: lovenox, SCD's ID: Zosyn 1/16>>  Plan: monitor WBC, hopefully this resolves without the need for surgery.    LOS: 1 day    Kalman Drape , [facility] Surgery 07/07/2016, 9:23 AM Pager: [phone] Consults: [phone] Mon-Fri 7:00 am-4:30 pm Sat-Sun 7:00 am-11:30 am

## 2016-07-08 LAB — BLOOD CULTURE ID PANEL (REFLEXED)
ACINETOBACTER BAUMANNII: NOT DETECTED
CANDIDA ALBICANS: NOT DETECTED
CANDIDA KRUSEI: NOT DETECTED
CANDIDA PARAPSILOSIS: NOT DETECTED
CANDIDA TROPICALIS: NOT DETECTED
Candida glabrata: NOT DETECTED
ENTEROBACTERIACEAE SPECIES: NOT DETECTED
ESCHERICHIA COLI: NOT DETECTED
Enterobacter cloacae complex: NOT DETECTED
Enterococcus species: NOT DETECTED
Haemophilus influenzae: NOT DETECTED
KLEBSIELLA OXYTOCA: NOT DETECTED
KLEBSIELLA PNEUMONIAE: NOT DETECTED
Listeria monocytogenes: NOT DETECTED
Methicillin resistance: NOT DETECTED
Neisseria meningitidis: NOT DETECTED
PSEUDOMONAS AERUGINOSA: NOT DETECTED
Proteus species: NOT DETECTED
SERRATIA MARCESCENS: NOT DETECTED
STREPTOCOCCUS AGALACTIAE: NOT DETECTED
Staphylococcus aureus (BCID): NOT DETECTED
Staphylococcus species: DETECTED — AB
Streptococcus pneumoniae: NOT DETECTED
Streptococcus pyogenes: NOT DETECTED
Streptococcus species: NOT DETECTED

## 2016-07-08 LAB — CBC
HEMATOCRIT: 36.7 % — AB (ref 39.0–52.0)
HEMOGLOBIN: 11.9 g/dL — AB (ref 13.0–17.0)
MCH: 29 pg (ref 26.0–34.0)
MCHC: 32.4 g/dL (ref 30.0–36.0)
MCV: 89.3 fL (ref 78.0–100.0)
Platelets: 366 10*3/uL (ref 150–400)
RBC: 4.11 MIL/uL — ABNORMAL LOW (ref 4.22–5.81)
RDW: 13.8 % (ref 11.5–15.5)
WBC: 12.2 10*3/uL — ABNORMAL HIGH (ref 4.0–10.5)

## 2016-07-08 LAB — BASIC METABOLIC PANEL
Anion gap: 8 (ref 5–15)
BUN: 11 mg/dL (ref 6–20)
CALCIUM: 8.2 mg/dL — AB (ref 8.9–10.3)
CHLORIDE: 110 mmol/L (ref 101–111)
CO2: 19 mmol/L — AB (ref 22–32)
CREATININE: 0.65 mg/dL (ref 0.61–1.24)
GFR calc non Af Amer: >60 mL/min (ref >60)
GLUCOSE: 74 mg/dL (ref 65–99)
Potassium: 4.4 mmol/L (ref 3.5–5.1)
Sodium: 137 mmol/L (ref 135–145)

## 2016-07-08 NOTE — Care Management Note (Signed)
Case Management Note  Patient Details  Name: MUAAZ ROUTT MRN: [id number] Date of Birth: 05-01-1946  Subjective/Objective:    Sigmoid Diverticulitis with abscess in the sigmoid mesocolon                Action/Plan: Discharge Planning: Lives with wife. Was independent prior to hospital stay. No problems with paying for meds.   PCP Shirline Frees MD  Expected Discharge Date:  07/10/2016                Expected Discharge Plan:  Home/Self Care  In-House Referral:  NA  Discharge planning Services  CM Consult  Post Acute Care Choice:  NA  Choice offered to:  NA  DME Arranged:  N/A DME Agency:  NA  HH Arranged:  NA HH Agency:  NA  Status of Service:  Completed, signed off  If discussed at [place] of Stay Meetings, dates discussed:    Additional Comments:  Erenest Rasher, RN 07/08/2016, 11:55 AM

## 2016-07-08 NOTE — Progress Notes (Signed)
PHARMACY - PHYSICIAN COMMUNICATION CRITICAL VALUE ALERT - BLOOD CULTURE IDENTIFICATION (BCID)  Results for orders placed or performed during the hospital encounter of 07/06/16  Blood Culture ID Panel (Reflexed) (Collected: 07/06/2016  5:22 PM)  Result Value Ref Range   Enterococcus species NOT DETECTED NOT DETECTED   Listeria monocytogenes NOT DETECTED NOT DETECTED   Staphylococcus species DETECTED (A) NOT DETECTED   Staphylococcus aureus NOT DETECTED NOT DETECTED   Methicillin resistance NOT DETECTED NOT DETECTED   Streptococcus species NOT DETECTED NOT DETECTED   Streptococcus agalactiae NOT DETECTED NOT DETECTED   Streptococcus pneumoniae NOT DETECTED NOT DETECTED   Streptococcus pyogenes NOT DETECTED NOT DETECTED   Acinetobacter baumannii NOT DETECTED NOT DETECTED   Enterobacteriaceae species NOT DETECTED NOT DETECTED   Enterobacter cloacae complex NOT DETECTED NOT DETECTED   Escherichia coli NOT DETECTED NOT DETECTED   Klebsiella oxytoca NOT DETECTED NOT DETECTED   Klebsiella pneumoniae NOT DETECTED NOT DETECTED   Proteus species NOT DETECTED NOT DETECTED   Serratia marcescens NOT DETECTED NOT DETECTED   Haemophilus influenzae NOT DETECTED NOT DETECTED   Neisseria meningitidis NOT DETECTED NOT DETECTED   Pseudomonas aeruginosa NOT DETECTED NOT DETECTED   Candida albicans NOT DETECTED NOT DETECTED   Candida glabrata NOT DETECTED NOT DETECTED   Candida krusei NOT DETECTED NOT DETECTED   Candida parapsilosis NOT DETECTED NOT DETECTED   Candida tropicalis NOT DETECTED NOT DETECTED    Name of physician (or Provider) Contacted: Dr. Georganna Skeans   Changes to prescribed antibiotics required: No change required at this time  Nicole Cella, RPh Clinical Pharmacist 07/08/2016  5:47 PM

## 2016-07-08 NOTE — Progress Notes (Signed)
[facility] Progress Note     Subjective: Pt is having multiple watery bowel movements. Minimal pain with bowel movements, improved from yesterday. Very minimal abdominal pain. No nausea, vomiting, fever, chills or cough. Pt having short intermittent episodes of mild diaphoresis.   Objective: Vital signs in last 24 hours: Temp:  [97.5 F (36.4 C)-98.5 F (36.9 C)] 97.5 F (36.4 C) (01/17 2200) Pulse Rate:  [79-83] 82 (01/17 2200) Resp:  [18-19] 18 (01/17 2200) BP: (142-148)/(72-77) 142/74 (01/17 2200) SpO2:  [95 %-98 %] 96 % (01/17 2200) Last BM Date: 07/07/16  Intake/Output from previous day: 01/17 0701 - 01/18 0700 In: 2150 [I.V.:2000; IV Piggyback:150] Out: -  Intake/Output this shift: No intake/output data recorded.  PE: Gen:  Alert, NAD, pleasant, cooperative, lying in bed Card:  RRR, no M/G/R heard Pulm:  CTA, no W/R/R, effort and rate normal Abd: Soft, non distended, +BS, mild suprapubic and LLQ tenderness, no abdominal scars noted Skin: no rashes noted, warm and dry  Lab Results:   Recent Labs  07/06/16 1722 07/07/16 0501  WBC 15.9* 18.4*  HGB 12.9* 12.3*  HCT 39.1 37.8*  PLT 372 369   BMET  Recent Labs  07/07/16 0501 07/08/16 0519  NA 135 137  K 4.1 4.4  CL 103 110  CO2 23 19*  GLUCOSE 95 74  BUN 11 11  CREATININE 0.86 0.65  CALCIUM 8.5* 8.2*   PT/INR No results for input(s): LABPROT, INR in the last 72 hours. CMP     Component Value Date/Time   NA 137 07/08/2016 0519   K 4.4 07/08/2016 0519   CL 110 07/08/2016 0519   CO2 19 (L) 07/08/2016 0519   GLUCOSE 74 07/08/2016 0519   BUN 11 07/08/2016 0519   CREATININE 0.65 07/08/2016 0519   CALCIUM 8.2 (L) 07/08/2016 0519   PROT 7.5 07/06/2016 1722   ALBUMIN 2.7 (L) 07/06/2016 1722   AST 25 07/06/2016 1722   ALT 21 07/06/2016 1722   ALKPHOS 52 07/06/2016 1722   BILITOT 0.3 07/06/2016 1722   GFRNONAA >60 07/08/2016 0519   GFRAA >60 07/08/2016 0519   Lipase  No results  found for: LIPASE     Studies/Results: Ct Abdomen Pelvis W Contrast  Result Date: 07/06/2016 CLINICAL DATA:  71 year old male with lower abdominal pain most severe on the left side for the past 6 weeks. EXAM: CT ABDOMEN AND PELVIS WITH CONTRAST TECHNIQUE: Multidetector CT imaging of the abdomen and pelvis was performed using the standard protocol following bolus administration of intravenous contrast. CONTRAST:  181mL ISOVUE-300 IOPAMIDOL (ISOVUE-300) INJECTION 61% COMPARISON:  No priors. FINDINGS: Lower chest: Atherosclerotic calcifications in the left anterior descending, left circumflex and right coronary arteries. Hepatobiliary: Multiple subcentimeter low-attenuation lesions are noted in the liver, too small to characterize, but statistically likely to represent tiny cysts. No larger more suspicious appearing hepatic lesions are noted. No intra or extrahepatic biliary ductal dilatation. Gallbladder is normal in appearance. Pancreas: No pancreatic mass. No pancreatic ductal dilatation. No pancreatic or peripancreatic fluid or inflammatory changes. Spleen: Unremarkable. Adrenals/Urinary Tract: In the lower pole collecting system of the left kidney there is a 7 mm nonobstructive calculus. No additional calculi are noted along the course of either ureter or within the lumen of the urinary bladder. There is no hydroureteronephrosis to indicate urinary tract obstruction at this time. Multiple subcentimeter low-attenuation lesions in the kidneys bilaterally are too small to characterize, but statistically likely to represent cysts. Urinary bladder is nearly completely decompressed, but otherwise  unremarkable in appearance. Bilateral adrenal glands are normal in appearance. Stomach/Bowel: The appearance of the stomach is normal. There is no pathologic dilatation of small bowel or colon. There are numerous colonic diverticulae, particularly in the sigmoid colon where there is extensive inflammatory changes and a  long segment of mural thickening, presumably from an acute diverticulitis. Lateral to the mid sigmoid colon on the right side there is a 2.1 x 3.1 x 4.6 cm centrally low-intermediate attenuation rim enhancing fluid collection, concerning for a diverticular abscess (axial image 68 of series 2 and coronal image 55 of series 3). The appendix is not confidently identified and may be surgically absent. Regardless, there are no inflammatory changes noted adjacent to the cecum to suggest the presence of an acute appendicitis at this time. Vascular/Lymphatic: Aortic atherosclerosis, without evidence of aneurysm or dissection in the abdominal or pelvic vasculature. No lymphadenopathy noted in the abdomen or pelvis. Reproductive: Prostate gland and seminal vesicles are unremarkable in appearance. Other: Small volume of free fluid in the low anatomic pelvis. No pneumoperitoneum. Musculoskeletal: There are no aggressive appearing lytic or blastic lesions noted in the visualized portions of the skeleton. IMPRESSION: 1. Acute sigmoid diverticulitis with possible diverticular abscess in the sigmoid mesocolon, as detailed above. There is also small volume of free fluid in the low anatomic pelvis. The possibility of frank perforation is not excluded, but is not strongly favored at this time. Surgical consultation is recommended. 2. Aortic atherosclerosis, in addition to 3 vessel coronary artery disease. Assessment for potential risk factor modification, dietary therapy or pharmacologic therapy may be warranted, if clinically indicated. 3. 7 mm nonobstructive calculus in the lower pole collecting system of left kidney. No ureteral stones or findings to suggest urinary tract obstruction at this time. 4. Additional incidental findings, as above. These results will be called to the ordering clinician or representative by the Radiologist Assistant, and communication documented in the PACS or zVision Dashboard. Electronically Signed   By:  Vinnie Langton M.D.   On: 07/06/2016 13:28    Anti-infectives: Anti-infectives    Start     Dose/Rate Route Frequency Ordered Stop   07/06/16 2330  piperacillin-tazobactam (ZOSYN) IVPB 3.375 g     3.375 g 12.5 mL/hr over 240 Minutes Intravenous Every 8 hours 07/06/16 1904     07/06/16 1700  piperacillin-tazobactam (ZOSYN) IVPB 3.375 g     3.375 g 100 mL/hr over 30 Minutes Intravenous  Once 07/06/16 1640 07/06/16 1804       Assessment/Plan  Sigmoid diverticulitis with abscess in the sigmoid mesocolon  - IV abx, bowel rest - pt is having minimal pain. Hope that this resolves with antibiotics and surgery can be avoided   - WBC trending down - will advance to clears today  FEN: clears VTE: lovenox, SCD's ID: Zosyn 1/16>>  Plan: monitor WBC and pain, hopefully this resolves without the need for surgery. Numerous watery bowel movements concerning for stricture.    LOS: 2 days    Kalman Drape , [facility] 07/08/2016, 8:14 AM Pager: [phone] Consults: [phone] Mon-Fri 7:00 am-4:30 pm Sat-Sun 7:00 am-11:30 am

## 2016-07-09 LAB — CULTURE, BLOOD (ROUTINE X 2)

## 2016-07-09 NOTE — Progress Notes (Signed)
Pharmacy Antibiotic Note  Franklin Ward is a 71 y.o. male admitted on 07/06/2016 with sigmoid diverticulitis with abscess in sigmoid mesocolon.  Pharmacy has been consulted for Zosyn dosing. Renal function is stable. WBC is trending down.   Plan: Zosyn 3.375g IV q8h (4 hour infusion).  Follow-up length of therapy  Height: 5\' 9"  (175.3 cm) Weight: 188 lb 14.4 oz (85.7 kg) IBW/kg (Calculated) : 70.7  Temp (24hrs), Avg:98 F (36.7 C), Min:97.5 F (36.4 C), Max:98.5 F (36.9 C)   Recent Labs Lab 07/06/16 1722 07/06/16 1744 07/06/16 1958 07/07/16 0501 07/08/16 0519  WBC 15.9*  --   --  18.4* 12.2*  CREATININE 0.80  --   --  0.86 0.65  LATICACIDVEN  --  2.02* 0.85  --   --     Estimated Creatinine Clearance: 91.9 mL/min (by C-G formula based on SCr of 0.65 mg/dL).    Allergies  Allergen Reactions  . Tetracyclines & Related Rash    Antimicrobials this admission:  Zosyn 1/16 >>   Dose adjustments this admission:  N/a  Microbiology results:  1/16 BCx: 1/2 GPC clusters (BCID- staph species)  Thank you for allowing pharmacy to be a part of this patient's care.  Sloan Leiter, PharmD, BCPS Clinical Pharmacist Clinical phone 07/09/2016 until 3:30 PM [phone] After hours, please call #[phone] 07/09/2016 10:57 AM

## 2016-07-09 NOTE — Care Management Important Message (Signed)
Important Message  Patient Details  Name: Franklin Ward MRN: [id number] Date of Birth: Jun 07, 1946   Medicare Important Message Given:  Yes    Ryosuke Ericksen [address] 07/09/2016, 1:38 PM

## 2016-07-09 NOTE — Progress Notes (Signed)
[facility] Progress Note     Subjective: Denies abdominal pain. Tolerating clear liquids. Denies fever, chills, nausea, or vomiting. Having multiple, watery stools daily. Denies hematochezia. Ambulating.  Objective: Vital signs in last 24 hours: Temp:  [97.5 F (36.4 C)-98.5 F (36.9 C)] 98.1 F (36.7 C) (01/19 0619) Pulse Rate:  [69-74] 74 (01/19 0619) Resp:  [18] 18 (01/19 0619) BP: (150-159)/(78-85) 159/78 (01/19 0619) SpO2:  [98 %-99 %] 99 % (01/19 0619) Last BM Date: 07/08/16  Intake/Output from previous day: 01/18 0701 - 01/19 0700 In: 1740 [P.O.:840; I.V.:800; IV Piggyback:100] Out: -  Intake/Output this shift: No intake/output data recorded.  PE: Gen:  Alert, NAD, pleasant Card:  RRR Pulm:  CTAB, nonlabored Abd: Soft, non-tender, non-distended, +BS in all 4 quadrants   Lab Results:   Recent Labs  07/07/16 0501 07/08/16 0519  WBC 18.4* 12.2*  HGB 12.3* 11.9*  HCT 37.8* 36.7*  PLT 369 366   BMET  Recent Labs  07/07/16 0501 07/08/16 0519  NA 135 137  K 4.1 4.4  CL 103 110  CO2 23 19*  GLUCOSE 95 74  BUN 11 11  CREATININE 0.86 0.65  CALCIUM 8.5* 8.2*   CMP     Component Value Date/Time   NA 137 07/08/2016 0519   K 4.4 07/08/2016 0519   CL 110 07/08/2016 0519   CO2 19 (L) 07/08/2016 0519   GLUCOSE 74 07/08/2016 0519   BUN 11 07/08/2016 0519   CREATININE 0.65 07/08/2016 0519   CALCIUM 8.2 (L) 07/08/2016 0519   PROT 7.5 07/06/2016 1722   ALBUMIN 2.7 (L) 07/06/2016 1722   AST 25 07/06/2016 1722   ALT 21 07/06/2016 1722   ALKPHOS 52 07/06/2016 1722   BILITOT 0.3 07/06/2016 1722   GFRNONAA >60 07/08/2016 0519   GFRAA >60 07/08/2016 0519   Anti-infectives: Anti-infectives    Start     Dose/Rate Route Frequency Ordered Stop   07/06/16 2330  piperacillin-tazobactam (ZOSYN) IVPB 3.375 g     3.375 g 12.5 mL/hr over 240 Minutes Intravenous Every 8 hours 07/06/16 1904     07/06/16 1700  piperacillin-tazobactam (ZOSYN) IVPB 3.375  g     3.375 g 100 mL/hr over 30 Minutes Intravenous  Once 07/06/16 1640 07/06/16 1804     Assessment/Plan Sigmoid diverticulitis with abscess in the sigmoid mesocolon - abdominal pain resolved - afebrile, VSS - WBC trending down, 12.2 on 1/18 - tolerating clears, will advance diet today  FEN: full liquids, SOFT in AM VTE: lovenox, SCD's ID: Zosyn 1/16>>  Plan: WBC trending down (12.2), abdominal pain resolved. Will advance diet to full liquids today and soft tomorrow at breakfast. CBC in AM - if WNL may transition to PO Augmentin and consider patient discharge.   LOS: 3 days    [facility] 07/09/2016, 8:53 AM Pager: [phone] Consults: [phone] Mon-Fri 7:00 am-4:30 pm Sat-Sun 7:00 am-11:30 am

## 2016-07-10 LAB — CBC
HCT: 37.1 % — ABNORMAL LOW (ref 39.0–52.0)
Hemoglobin: 11.9 g/dL — ABNORMAL LOW (ref 13.0–17.0)
MCH: 28.6 pg (ref 26.0–34.0)
MCHC: 32.1 g/dL (ref 30.0–36.0)
MCV: 89.2 fL (ref 78.0–100.0)
Platelets: 367 10*3/uL (ref 150–400)
RBC: 4.16 MIL/uL — ABNORMAL LOW (ref 4.22–5.81)
RDW: 13.7 % (ref 11.5–15.5)
WBC: 7.8 10*3/uL (ref 4.0–10.5)

## 2016-07-10 MED ORDER — AMOXICILLIN-POT CLAVULANATE 875-125 MG PO TABS: 1.0000 | ORAL_TABLET | Freq: Two times a day (BID) | ORAL | 0 refills | Status: DC

## 2016-07-10 MED ORDER — AMOXICILLIN-POT CLAVULANATE 875-125 MG PO TABS
1.0000 | ORAL_TABLET | Freq: Two times a day (BID) | ORAL | Status: DC
  Administered 2016-07-10: 1 via ORAL
  Filled 2016-07-10: qty 1

## 2016-07-10 NOTE — Progress Notes (Signed)
Lindon R Peckham to be D/C'd  per MD order. Discussed with the patient and all questions fully answered.  VSS, Skin clean, dry and intact without evidence of skin break down, no evidence of skin tears noted.  IV catheter discontinued intact. Site without signs and symptoms of complications. Dressing and pressure applied.  An After Visit Summary was printed and given to the patient. Patient received prescription.  D/c education completed with patient/family including follow up instructions, medication list, d/c activities limitations if indicated, with other d/c instructions as indicated by MD - patient able to verbalize understanding, all questions fully answered.   Patient instructed to return to ED, call 911, or call MD for any changes in condition.   Patient to be escorted via [place], and D/C home via private auto.

## 2016-07-10 NOTE — Progress Notes (Signed)
  Subjective: No complaints. Having loose stools  Objective: Vital signs in last 24 hours: Temp:  [97.8 F (36.6 C)-98.2 F (36.8 C)] 97.8 F (36.6 C) (01/20 0500) Pulse Rate:  [72-80] 72 (01/20 0500) Resp:  [18] 18 (01/20 0500) BP: (137-158)/(77-81) 158/81 (01/20 0500) SpO2:  [97 %-98 %] 98 % (01/20 0500) Last BM Date: 07/09/16  Intake/Output from previous day: 01/19 0701 - 01/20 0700 In: 752 [P.O.:702; IV Piggyback:50] Out: -  Intake/Output this shift: No intake/output data recorded.  Resp: clear to auscultation bilaterally Cardio: regular rate and rhythm GI: soft, nontender  Lab Results:   Recent Labs  07/08/16 0519 07/10/16 0443  WBC 12.2* 7.8  HGB 11.9* 11.9*  HCT 36.7* 37.1*  PLT 366 367   BMET  Recent Labs  07/08/16 0519  NA 137  K 4.4  CL 110  CO2 19*  GLUCOSE 74  BUN 11  CREATININE 0.65  CALCIUM 8.2*   PT/INR No results for input(s): LABPROT, INR in the last 72 hours. ABG No results for input(s): PHART, HCO3 in the last 72 hours.  Invalid input(s): PCO2, PO2  Studies/Results: No results found.  Anti-infectives: Anti-infectives    Start     Dose/Rate Route Frequency Ordered Stop   07/10/16 1000  amoxicillin-clavulanate (AUGMENTIN) 875-125 MG per tablet 1 tablet     1 tablet Oral Every 12 hours 07/10/16 0803     07/06/16 2330  piperacillin-tazobactam (ZOSYN) IVPB 3.375 g  Status:  Discontinued     3.375 g 12.5 mL/hr over 240 Minutes Intravenous Every 8 hours 07/06/16 1904 07/10/16 0803   07/06/16 1700  piperacillin-tazobactam (ZOSYN) IVPB 3.375 g     3.375 g 100 mL/hr over 30 Minutes Intravenous  Once 07/06/16 1640 07/06/16 1804      Assessment/Plan: s/p * No surgery found * Advance diet  Switch to oral abx Consider discharge later today  LOS: 4 days    TOTH III,Keyante Durio S 07/10/2016

## 2016-07-11 LAB — CULTURE, BLOOD (ROUTINE X 2): Culture: NO GROWTH

## 2016-07-13 NOTE — Discharge Summary (Signed)
[facility] Discharge Summary   Patient ID: Franklin Ward MRN: [id number] DOB/AGE: 02/03/1946 71 y.o.  Admit date: 07/06/2016 Discharge date: 07/13/2016  Admitting Diagnosis: Diverticulitis with abscess  Discharge Diagnosis Patient Active Problem List   Diagnosis Date Noted  . Abscess of sigmoid colon due to diverticulitis 07/06/2016    Consultants None   Imaging: 07/06/16 CT ABD/PELV W/ CONTRAST - There are numerous colonic diverticulae, particularly in the sigmoid colon where there is extensive inflammatory changes and a long segment of mural thickening, presumably from an acute diverticulitis. Lateral to the mid sigmoid colon on the right side there is a 2.1 x 3.1 x 4.6 cm centrally low-intermediate attenuation rim enhancing fluid collection, concerning for a diverticular abscess   Procedures None  Hospital Course:  71 year-old male in good health who presented to [facility] with six weeks of intermittent constipation followed by two weeks of diarrhea.  CT scan as above and WBC 15.9. lactate 2.02. Patient was admitted for IV antibiotics, IVF, and bowel rest.  Diet was advanced as tolerated.  On hospital day #2 the patients pain was improving and he was started on a clear liquid diet. Diet advanced gradually, pain improved, leukocytosis resolved, and on hospital day #4 patient was medically stable for discharge home on PO abx.   Allergies as of 07/10/2016      Reactions   Tetracyclines & Related Rash      Medication List    TAKE these medications   amoxicillin-clavulanate 875-125 MG tablet Commonly known as:  AUGMENTIN Take 1 tablet by mouth 2 (two) times daily.   aspirin 81 MG tablet Take 81 mg by mouth daily.   B-12 2500 MCG Tabs Take 1 tablet by mouth daily.   CALCIUM 600 + D PO Take 1 tablet by mouth daily.   ciprofloxacin 500 MG tablet Commonly known as:  CIPRO Take 500 mg by mouth 2 (two) times daily.   Garlic 123XX123 MG Caps Take 1 capsule by  mouth daily.   METAMUCIL FIBER PO Take by mouth See admin instructions. Mix 1 teaspoonful of powder into a glass of water and drink two to three times a day   metroNIDAZOLE 500 MG tablet Commonly known as:  FLAGYL Take 500 mg by mouth 2 (two) times daily.   multivitamin tablet Take 1 tablet by mouth daily.   OMEGA-3 FISH OIL PO Take 1 capsule by mouth daily.   OSTEO BI-FLEX ADV TRIPLE ST PO Take 1 tablet by mouth 2 (two) times daily.   pseudoephedrine-guaifenesin 60-600 MG 12 hr tablet Commonly known as:  MUCINEX D Take 1 tablet by mouth every 12 (twelve) hours as needed for congestion.   Turmeric 500 MG Caps Take 500 mg by mouth daily.   VITAMIN C PO Take 1,000 mg by mouth daily.   vitamin E 400 UNIT capsule Take 400 Units by mouth daily.       Follow-up Information    Arta Bruce Kinsinger, MD Follow up in 2 week(s).   Specialty:  General Surgery Contact information: [place] [address] [phone]           Signed: Obie Dredge, [facility] 07/13/2016, 3:05 PM Pager: [phone] Consults: [phone] Mon-Fri 7:00 am-4:30 pm Sat-Sun 7:00 am-11:30 am

## 2016-11-30 ENCOUNTER — Ambulatory Visit (INDEPENDENT_AMBULATORY_CARE_PROVIDER_SITE_OTHER): Payer: Medicare Other | Admitting: Internal Medicine

## 2016-11-30 ENCOUNTER — Encounter: Payer: Self-pay | Admitting: Internal Medicine

## 2016-11-30 ENCOUNTER — Encounter (INDEPENDENT_AMBULATORY_CARE_PROVIDER_SITE_OTHER): Payer: Self-pay

## 2016-11-30 VITALS — BP 150/70 | HR 68 | Ht 69.0 in | Wt 200.0 lb

## 2016-11-30 DIAGNOSIS — Z86010 Personal history of colonic polyps: Secondary | ICD-10-CM | POA: Diagnosis not present

## 2016-11-30 DIAGNOSIS — K5732 Diverticulitis of large intestine without perforation or abscess without bleeding: Secondary | ICD-10-CM | POA: Diagnosis not present

## 2016-11-30 DIAGNOSIS — R935 Abnormal findings on diagnostic imaging of other abdominal regions, including retroperitoneum: Secondary | ICD-10-CM

## 2016-11-30 MED ORDER — NA SULFATE-K SULFATE-MG SULF 17.5-3.13-1.6 GM/177ML PO SOLN: 1.0000 | Freq: Once | ORAL | 0 refills | Status: AC

## 2016-11-30 NOTE — Patient Instructions (Signed)

## 2016-11-30 NOTE — Progress Notes (Signed)
HISTORY OF PRESENT ILLNESS:  Franklin Ward is a 71 y.o. male who has been followed in this office for colon cancer screening and colon polyps. He is sent today by general surgery for evaluation after having been hospitalized for diverticulitis. This suggests he may need colonoscopy. Patient was in his usual state of health until January when he developed severe persistent abdominal pain. CT scan revealed acute diverticulitis with probable associated abscess. He was treated with antibiotics and discharged within 4 days. Since his hospital discharge she states that he has been feeling well. He did see general surgery as an outpatient who released him. Given his history of colon polyps and suggested follow-up colonoscopy. Patient denies a further problems with pain, fever, or any issues with his bowel habits. His last colonoscopy was October 2015. He was found to have 4 polyps along with severe left-sided diverticulosis. Polyps were adenomatous 3. Follow-up in 3 years recommended. He is accompanied today by his wife. No medications except for aspirin. He does take probiotic. He has questions regarding diverticular disease and diet.  REVIEW OF SYSTEMS:  All non-GI ROS negative entirely  History reviewed. No pertinent past medical history.  Past Surgical History:  Procedure Laterality Date  . NO PAST SURGERIES      Social History Franklin Ward  reports that he quit smoking about 36 years ago. His smokeless tobacco use includes Chew. He reports that he does not drink alcohol or use drugs.  family history includes Lung cancer in his father.  Allergies  Allergen Reactions  . Tetracyclines & Related Rash       PHYSICAL EXAMINATION: Vital signs: BP (!) 150/70   Pulse 68   Ht 5\' 9"  (1.753 m)   Wt 200 lb (90.7 kg)   BMI 29.53 kg/m   Constitutional: generally well-appearing, no acute distress Psychiatric: alert and oriented x3, cooperative Eyes: extraocular movements intact, anicteric,  conjunctiva pink Mouth: oral pharynx moist, no lesions Neck: supple no lymphadenopathy Cardiovascular: heart regular rate and rhythm, no murmur Lungs: clear to auscultation bilaterally Abdomen: soft, nontender, nondistended, no obvious ascites, no peritoneal signs, normal bowel sounds, no organomegaly Rectal:Deferred until colonoscopy Extremities: no clubbing cyanosis or lower extremity edema bilaterally Skin: no lesions on visible extremities Neuro: No focal deficits. Cranial nerves intact  ASSESSMENT:  #1. Acute diverticulitis January 2018. Question associated abscess. Complete clinical recovery after appropriate antimicrobial therapy. Asymptomatic #2. Abnormal CT scan of the abdomen. Acute diverticulitis with possible complicating features. Has been seen by general surgery and released without recommendations for surgery. #3. History of multiple adenomatous colon polyps. Due for routine surveillance this year   PLAN:  #1. Discussion on diverticular disease. Both diverticulosis and diverticulitis #2. Discussion on dietary restrictions. None #3. Discussion regarding surveillance colonoscopy. Okay to schedule this time.The nature of the procedure, as well as the risks, benefits, and alternatives were carefully and thoroughly reviewed with the patient. Ample time for discussion and questions allowed. The patient understood, was satisfied, and agreed to proceed.  25 minutes spent face-to-face with the patient. Greater than 50% a time use for counseling regarding his, complicated diverticular disease, history of multiple adenomatous polyps, and plans for surveillance colonoscopy.

## 2016-12-09 ENCOUNTER — Encounter: Payer: Self-pay | Admitting: Internal Medicine

## 2016-12-09 ENCOUNTER — Ambulatory Visit (AMBULATORY_SURGERY_CENTER): Payer: Medicare Other | Admitting: Internal Medicine

## 2016-12-09 VITALS — BP 158/89 | HR 65 | Temp 97.5°F | Resp 16 | Ht 69.0 in | Wt 200.0 lb

## 2016-12-09 DIAGNOSIS — K5732 Diverticulitis of large intestine without perforation or abscess without bleeding: Secondary | ICD-10-CM

## 2016-12-09 DIAGNOSIS — Z86010 Personal history of colonic polyps: Secondary | ICD-10-CM | POA: Diagnosis not present

## 2016-12-09 DIAGNOSIS — K6389 Other specified diseases of intestine: Secondary | ICD-10-CM

## 2016-12-09 DIAGNOSIS — D123 Benign neoplasm of transverse colon: Secondary | ICD-10-CM

## 2016-12-09 MED ORDER — SODIUM CHLORIDE 0.9 % IV SOLN: 500.0000 mL | INTRAVENOUS | Status: AC

## 2016-12-09 NOTE — Op Note (Signed)
Franklin Ward Patient Name: Franklin Ward Procedure Date: 12/09/2016 1:43 PM MRN: [id number] Endoscopist: Docia Chuck. Henrene Pastor , MD Age: 71 Referring MD:  Date of Birth: 1946/04/10 Gender: Male Account #: [id number] Procedure:                Colonoscopy, with cold snare polypectomy x 1 Indications:              High risk colon cancer surveillance: Personal                            history of multiple (3 or more) adenomas. Previous                            examination October 2015. Earlier this year with                            perforated diverticulitis. Has been evaluated by                            general surgery Medicines:                Monitored Anesthesia Care Procedure:                Pre-Anesthesia Assessment:                           - Prior to the procedure, a History and Physical                            was performed, and patient medications and                            allergies were reviewed. The patient's tolerance of                            previous anesthesia was also reviewed. The risks                            and benefits of the procedure and the sedation                            options and risks were discussed with the patient.                            All questions were answered, and informed consent                            was obtained. Prior Anticoagulants: The patient has                            taken no previous anticoagulant or antiplatelet                            agents. ASA Grade Assessment: II - A patient with  mild systemic disease. After reviewing the risks                            and benefits, the patient was deemed in                            satisfactory condition to undergo the procedure.                           After obtaining informed consent, the colonoscope                            was passed under direct vision. Throughout the                            procedure, the  patient's blood pressure, pulse, and                            oxygen saturations were monitored continuously. The                            Colonoscope was introduced through the anus and                            advanced to the the cecum, identified by                            appendiceal orifice and ileocecal valve. The                            ileocecal valve, appendiceal orifice, and rectum                            were photographed. The quality of the bowel                            preparation was excellent. The colonoscopy was                            performed without difficulty. The patient tolerated                            the procedure well. The bowel preparation used was                            SUPREP. Scope In: 1:46:17 PM Scope Out: 2:04:35 PM Scope Withdrawal Time: 0 hours 14 minutes 31 seconds  Total Procedure Duration: 0 hours 18 minutes 18 seconds  Findings:                 A 3 mm polyp was found in the transverse colon. The                            polyp was removed with a cold snare. Resection and  retrieval were complete.                           Multiple small and large-mouthed diverticula were                            found in the left colon. As well, stenosis in the                            sigmoid region.                           The exam was otherwise without abnormality on                            direct and retroflexion views. Complications:            No immediate complications. Estimated blood loss:                            None. Estimated Blood Loss:     Estimated blood loss: none. Impression:               - One 3 mm polyp in the transverse colon, removed                            with a cold snare. Resected and retrieved.                           - Diverticulosis in the left colon, with stenosis.                           - The examination was otherwise normal on direct                             and retroflexion views. Recommendation:           - Repeat colonoscopy in 5 years for                            surveillance(PEDIATRIC SCOPE).                           - Patient has a contact number available for                            emergencies. The signs and symptoms of potential                            delayed complications were discussed with the                            patient. Return to normal activities tomorrow.                            Written discharge instructions were provided to the  patient.                           - Resume previous diet.                           - Continue present medications.                           - Await pathology results. Docia Chuck. Henrene Pastor, MD 12/09/2016 2:11:41 PM This report has been signed electronically.

## 2016-12-09 NOTE — Patient Instructions (Signed)
YOU HAD AN ENDOSCOPIC PROCEDURE TODAY AT THE [facility]:   Refer to the procedure report that was given to you for any specific questions about what was found during the examination.  If the procedure report does not answer your questions, please call your gastroenterologist to clarify.  If you requested that your care partner not be given the details of your procedure findings, then the procedure report has been included in a sealed envelope for you to review at your convenience later.  YOU SHOULD EXPECT: Some feelings of bloating in the abdomen. Passage of more gas than usual.  Walking can help get rid of the air that was put into your GI tract during the procedure and reduce the bloating. If you had a lower endoscopy (such as a colonoscopy or flexible sigmoidoscopy) you may notice spotting of blood in your stool or on the toilet paper. If you underwent a bowel prep for your procedure, you may not have a normal bowel movement for a few days.  Please Note:  You might notice some irritation and congestion in your nose or some drainage.  This is from the oxygen used during your procedure.  There is no need for concern and it should clear up in a day or so.  SYMPTOMS TO REPORT IMMEDIATELY:   Following lower endoscopy (colonoscopy or flexible sigmoidoscopy):  Excessive amounts of blood in the stool  Significant tenderness or worsening of abdominal pains  Swelling of the abdomen that is new, acute  Fever of 100F or higher    For urgent or emergent issues, a gastroenterologist can be reached at any hour by calling [phone].   DIET:  We do recommend a small meal at first, but then you may proceed to your regular diet.  Drink plenty of fluids but you should avoid alcoholic beverages for 24 hours.  ACTIVITY:  You should plan to take it easy for the rest of today and you should NOT DRIVE or use heavy machinery until tomorrow (because of the sedation medicines used during the test).     FOLLOW UP: Our staff will call the number listed on your records the next business day following your procedure to check on you and address any questions or concerns that you may have regarding the information given to you following your procedure. If we do not reach you, we will leave a message.  However, if you are feeling well and you are not experiencing any problems, there is no need to return our call.  We will assume that you have returned to your regular daily activities without incident.  If any biopsies were taken you will be contacted by phone or by letter within the next 1-3 weeks.  Please call us at [phone] if you have not heard about the biopsies in 3 weeks.    SIGNATURES/CONFIDENTIALITY: You and/or your care partner have signed paperwork which will be entered into your electronic medical record.  These signatures attest to the fact that that the information above on your After Visit Summary has been reviewed and is understood.  Full responsibility of the confidentiality of this discharge information lies with you and/or your care-partner.   Resume medications. Information given on polyps and diverticulosis. 

## 2016-12-09 NOTE — Progress Notes (Signed)
Pt. Reports no change in his surgical or medical history since pre-visit on 11/30/2016.

## 2016-12-09 NOTE — Progress Notes (Signed)
Report to PACU, RN, vss, BBS= Clear.  

## 2016-12-09 NOTE — Progress Notes (Signed)
Pt. Has not passed air,stated it feels like it is right here,ambulated to bathroom to allow time to pass air.pt.1437 pt. Is ambulating on unit.1442 pt. Placed back in bed and repositioned from side to side. Pt. Stated that I had this problem last time and it took a long time I had to go home and walk and walk pt. Placed on all fours to see if that would help.pt. Began passing air and he stated it is beginning to feel better already. Pt. Stated" I am ready to get dress I feel better."

## 2016-12-09 NOTE — Progress Notes (Signed)
Called to room to assist during endoscopic procedure.  Patient ID and intended procedure confirmed with present staff. Received instructions for my participation in the procedure from the performing physician.  

## 2016-12-10 ENCOUNTER — Telehealth: Payer: Self-pay | Admitting: *Deleted

## 2016-12-10 NOTE — Telephone Encounter (Signed)
  Follow up Call-  Call back number 12/09/2016 03/28/2014  Post procedure Call Back phone  # [phone]  Permission to leave phone message Yes Yes  Some recent data might be hidden     Patient questions:  Do you have a fever, pain , or abdominal swelling? No. Pain Score  0 *  Have you tolerated food without any problems? Yes.    Have you been able to return to your normal activities? Yes.    Do you have any questions about your discharge instructions: Diet   No. Medications  No. Follow up visit  No.  Do you have questions or concerns about your Care? No.  Actions: * If pain score is 4 or above: No action needed, pain <4.

## 2016-12-15 ENCOUNTER — Encounter: Payer: Self-pay | Admitting: Internal Medicine

## 2017-04-19 ENCOUNTER — Encounter: Payer: Self-pay | Admitting: Internal Medicine

## 2017-11-07 ENCOUNTER — Other Ambulatory Visit: Payer: Self-pay | Admitting: Family Medicine

## 2017-11-07 DIAGNOSIS — I6523 Occlusion and stenosis of bilateral carotid arteries: Secondary | ICD-10-CM

## 2018-03-25 IMAGING — CT CT ABD-PELV W/ CM
2 of 5 series · 14 of 46 positions shown, 16 images · IV contrast (APPLIED)
Comparison: No priors.

CLINICAL DATA: 70-year-old male with lower abdominal pain most
severe on the left side for the past 6 weeks.

EXAM:
CT ABDOMEN AND PELVIS WITH CONTRAST
TECHNIQUE: Multidetector CT imaging of the abdomen and pelvis was performed
using the standard protocol following bolus administration of
intravenous contrast.
CONTRAST:  100mL CIS4NM-RPP IOPAMIDOL (CIS4NM-RPP) INJECTION 61%

[Series 2: abd/pelvis w/cm · axial · 0.78mm/px · z∈[-492,-42]mm · 11 of 102 slices shown, 13 images]
[im 6/102  soft-tissue]
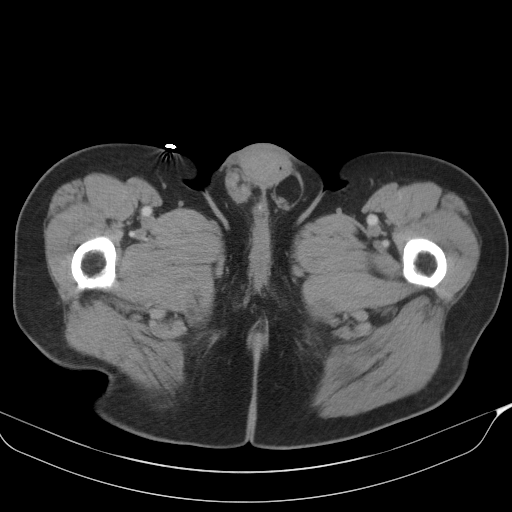
[im 6/102  bone]
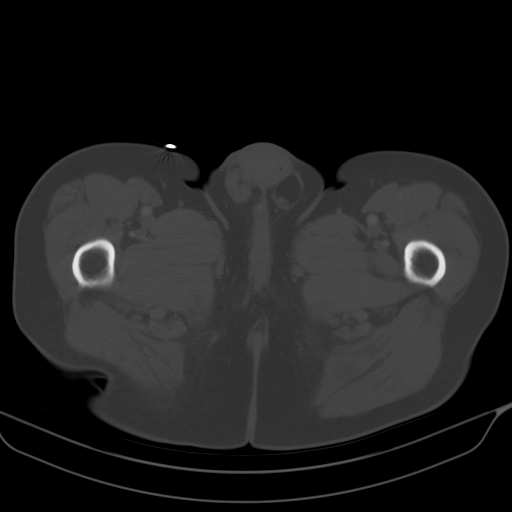
[im 16/102  soft-tissue]
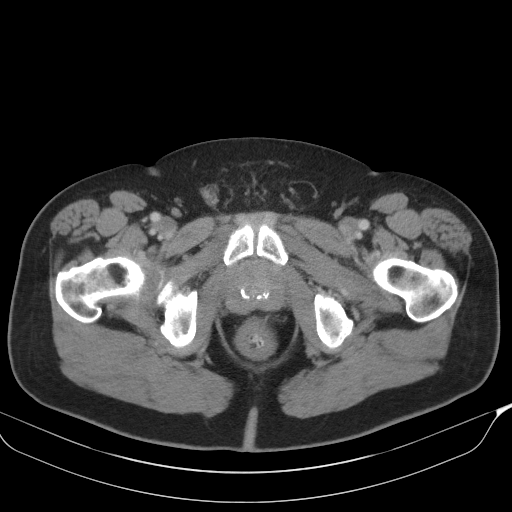
[im 27/102  soft-tissue]
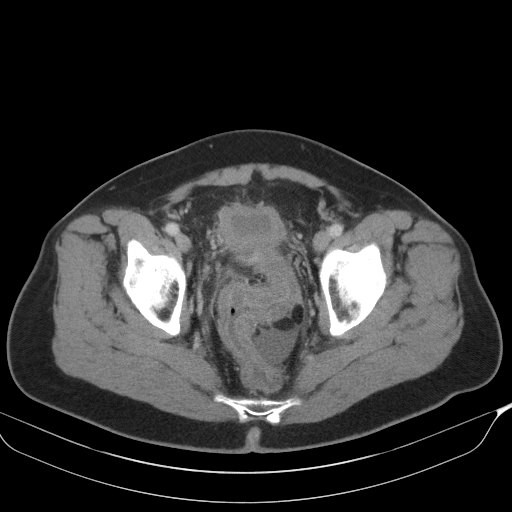
[im 32/102  soft-tissue]
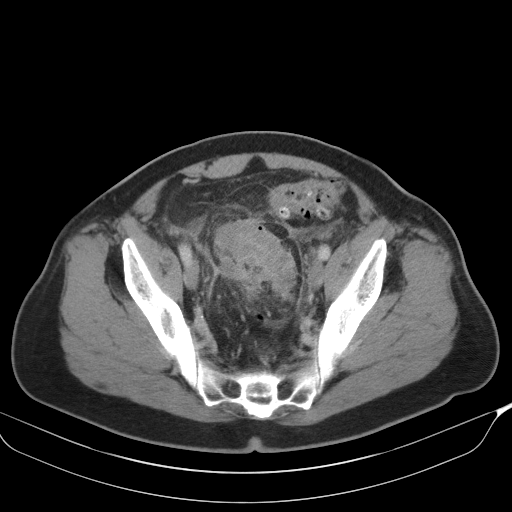
[im 43/102  soft-tissue]
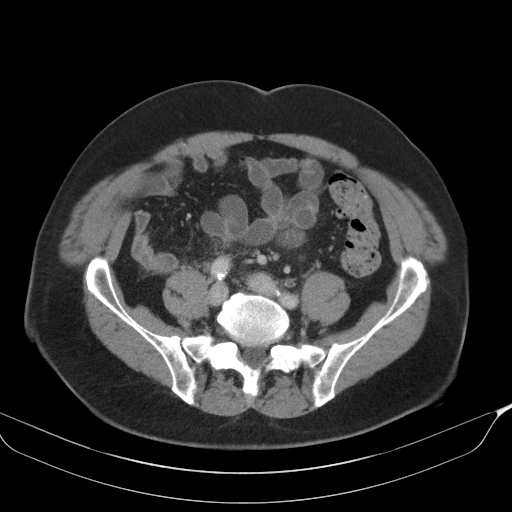
[im 54/102  soft-tissue]
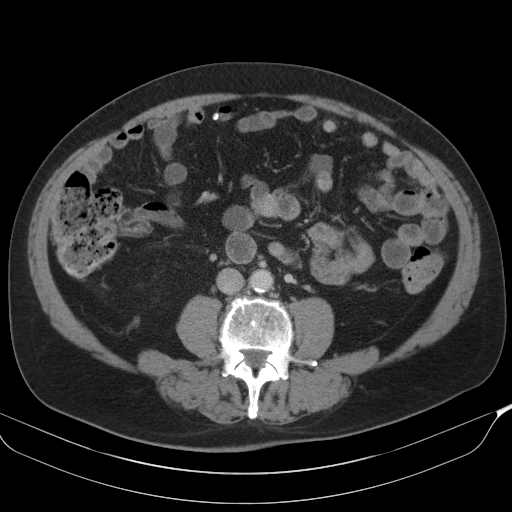
[im 59/102  soft-tissue]
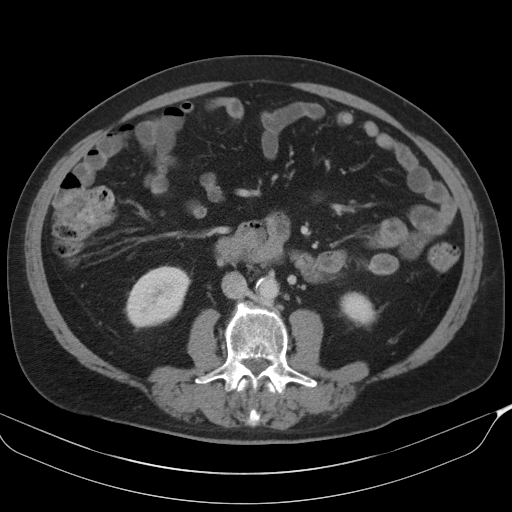
[im 70/102  soft-tissue]
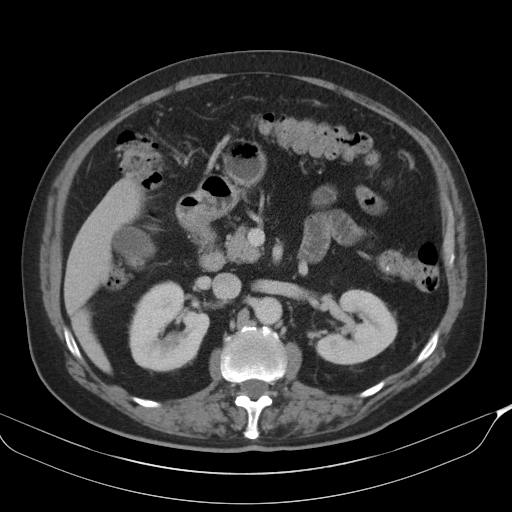
[im 75/102  soft-tissue]
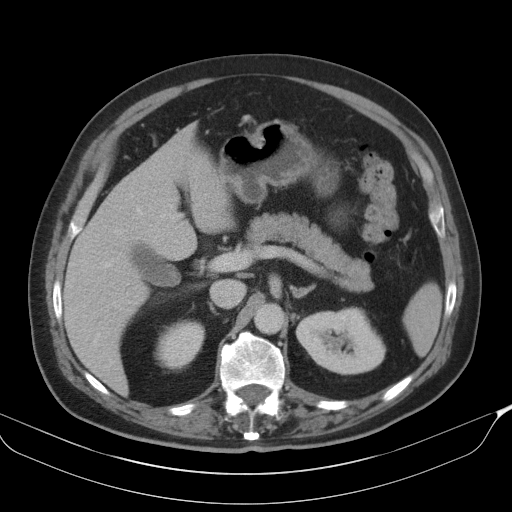
[im 75/102  bone]
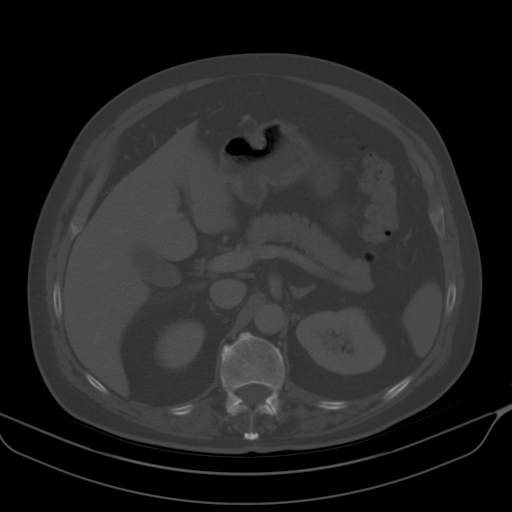
[im 86/102  soft-tissue]
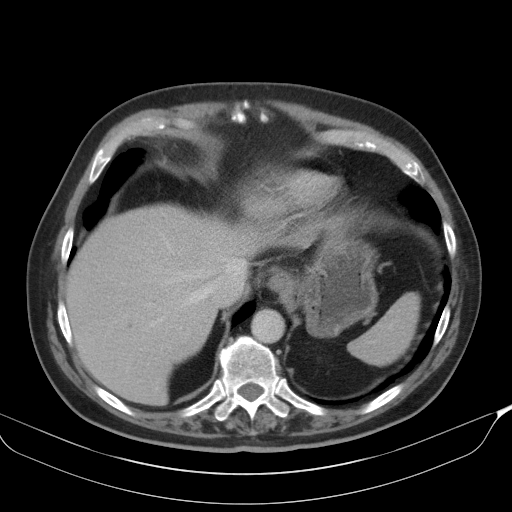
[im 96/102  soft-tissue]
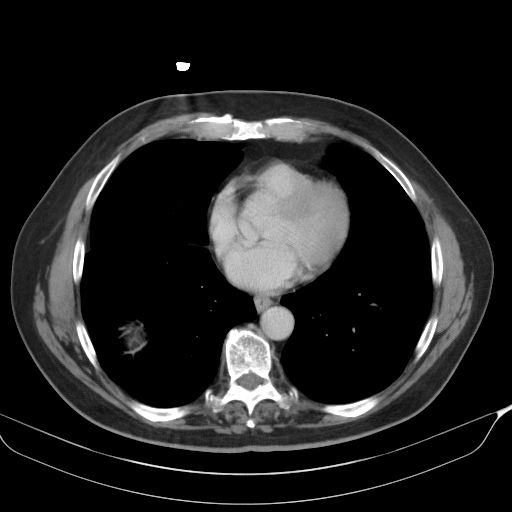

[Series 3: cor · coronal · 0.69mm/px · 3 of 101 slices shown]
[im 34/101  soft-tissue]
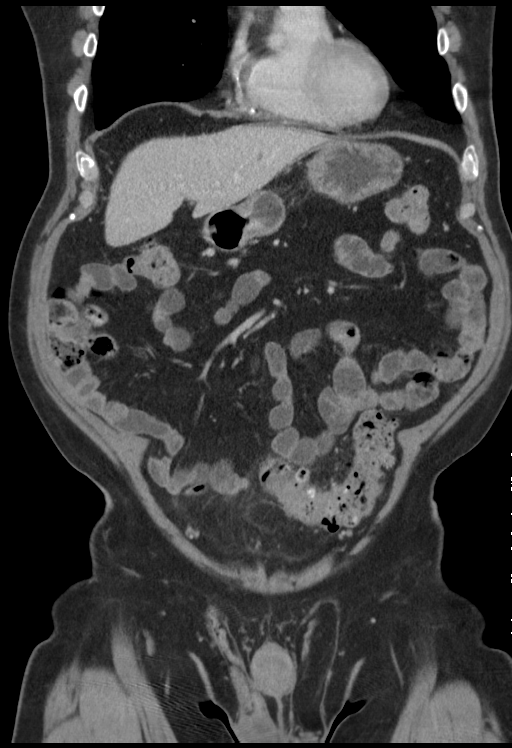
[im 45/101  soft-tissue]
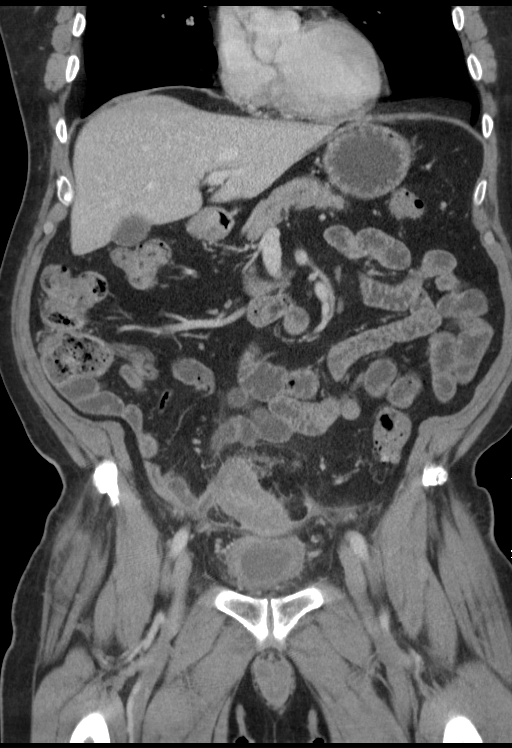
[im 56/101  soft-tissue]
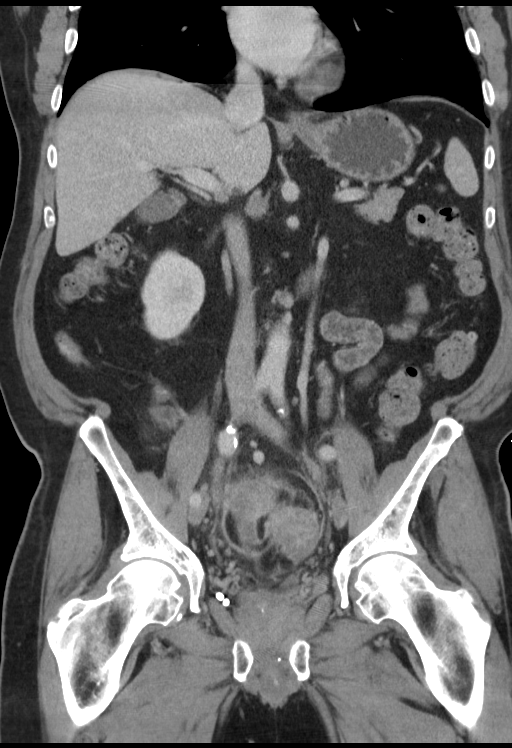

[14 of 46 positions shown; findings below may reference images not displayed]

FINDINGS: Lower chest: Atherosclerotic calcifications in the left anterior
descending, left circumflex and right coronary arteries.

Hepatobiliary: Multiple subcentimeter low-attenuation lesions are
noted in the liver, too small to characterize, but statistically
likely to represent tiny cysts. No larger more suspicious appearing
hepatic lesions are noted. No intra or extrahepatic biliary ductal
dilatation. Gallbladder is normal in appearance.

Pancreas: No pancreatic mass. No pancreatic ductal dilatation. No
pancreatic or peripancreatic fluid or inflammatory changes.

Spleen: Unremarkable.

Adrenals/Urinary Tract: In the lower pole collecting system of the
left kidney there is a 7 mm nonobstructive calculus. No additional
calculi are noted along the course of either ureter or within the
lumen of the urinary bladder. There is no hydroureteronephrosis to
indicate urinary tract obstruction at this time. Multiple
subcentimeter low-attenuation lesions in the kidneys bilaterally are
too small to characterize, but statistically likely to represent
cysts. Urinary bladder is nearly completely decompressed, but
otherwise unremarkable in appearance. Bilateral adrenal glands are
normal in appearance.

Stomach/Bowel: The appearance of the stomach is normal. There is no
pathologic dilatation of small bowel or colon. There are numerous
colonic diverticulae, particularly in the sigmoid colon where there
is extensive inflammatory changes and a long segment of mural
thickening, presumably from an acute diverticulitis. Lateral to the
mid sigmoid colon on the right side there is a 2.1 x 3.1 x 4.6 cm
centrally low-intermediate attenuation rim enhancing fluid
collection, concerning for a diverticular abscess (axial image 68 of
series 2 and coronal image 55 of series 3). The appendix is not
confidently identified and may be surgically absent. Regardless,
there are no inflammatory changes noted adjacent to the cecum to
suggest the presence of an acute appendicitis at this time.

Vascular/Lymphatic: Aortic atherosclerosis, without evidence of
aneurysm or dissection in the abdominal or pelvic vasculature. No
lymphadenopathy noted in the abdomen or pelvis.

Reproductive: Prostate gland and seminal vesicles are unremarkable
in appearance.

Other: Small volume of free fluid in the low anatomic pelvis. No
pneumoperitoneum.

Musculoskeletal: There are no aggressive appearing lytic or blastic
lesions noted in the visualized portions of the skeleton.
IMPRESSION: 1. Acute sigmoid diverticulitis with possible diverticular abscess
in the sigmoid mesocolon, as detailed above. There is also small
volume of free fluid in the low anatomic pelvis. The possibility of
frank perforation is not excluded, but is not strongly favored at
this time. Surgical consultation is recommended.
2. Aortic atherosclerosis, in addition to 3 vessel coronary artery
disease. Assessment for potential risk factor modification, dietary
therapy or pharmacologic therapy may be warranted, if clinically
indicated.
3. 7 mm nonobstructive calculus in the lower pole collecting system
of left kidney. No ureteral stones or findings to suggest urinary
tract obstruction at this time.
4. Additional incidental findings, as above.
These results will be called to the ordering clinician or
representative by the Radiologist Assistant, and communication
documented in the PACS or zVision Dashboard.

## 2018-08-09 ENCOUNTER — Emergency Department (HOSPITAL_COMMUNITY): Payer: Medicare Other

## 2018-08-09 ENCOUNTER — Other Ambulatory Visit: Payer: Self-pay

## 2018-08-09 ENCOUNTER — Inpatient Hospital Stay (HOSPITAL_COMMUNITY)
Admission: EM | Admit: 2018-08-09 | Discharge: 2018-08-11 | DRG: 247 | Disposition: A | Discharge status: Home / Self Care | Payer: Medicare Other | Attending: Family Medicine | Admitting: Family Medicine

## 2018-08-09 ENCOUNTER — Encounter (HOSPITAL_COMMUNITY): Payer: Self-pay

## 2018-08-09 DIAGNOSIS — I10 Essential (primary) hypertension: Secondary | ICD-10-CM | POA: Diagnosis present

## 2018-08-09 DIAGNOSIS — E782 Mixed hyperlipidemia: Secondary | ICD-10-CM | POA: Diagnosis not present

## 2018-08-09 DIAGNOSIS — Z87891 Personal history of nicotine dependence: Secondary | ICD-10-CM

## 2018-08-09 DIAGNOSIS — I214 Non-ST elevation (NSTEMI) myocardial infarction: Principal | ICD-10-CM | POA: Diagnosis present

## 2018-08-09 DIAGNOSIS — Z801 Family history of malignant neoplasm of trachea, bronchus and lung: Secondary | ICD-10-CM | POA: Diagnosis not present

## 2018-08-09 DIAGNOSIS — Z7982 Long term (current) use of aspirin: Secondary | ICD-10-CM | POA: Diagnosis not present

## 2018-08-09 DIAGNOSIS — E785 Hyperlipidemia, unspecified: Secondary | ICD-10-CM | POA: Diagnosis present

## 2018-08-09 DIAGNOSIS — Z955 Presence of coronary angioplasty implant and graft: Secondary | ICD-10-CM | POA: Diagnosis not present

## 2018-08-09 DIAGNOSIS — Z881 Allergy status to other antibiotic agents status: Secondary | ICD-10-CM | POA: Diagnosis not present

## 2018-08-09 DIAGNOSIS — R079 Chest pain, unspecified: Secondary | ICD-10-CM | POA: Diagnosis present

## 2018-08-09 DIAGNOSIS — I25110 Atherosclerotic heart disease of native coronary artery with unstable angina pectoris: Secondary | ICD-10-CM | POA: Diagnosis present

## 2018-08-09 DIAGNOSIS — I2510 Atherosclerotic heart disease of native coronary artery without angina pectoris: Secondary | ICD-10-CM | POA: Diagnosis not present

## 2018-08-09 HISTORY — DX: Atherosclerotic heart disease of native coronary artery without angina pectoris: I25.10

## 2018-08-09 HISTORY — DX: Diverticulitis of intestine, part unspecified, without perforation or abscess without bleeding: K57.92

## 2018-08-09 HISTORY — DX: Non-ST elevation (NSTEMI) myocardial infarction: I21.4

## 2018-08-09 LAB — CBC
HCT: 47.5 % (ref 39.0–52.0)
Hemoglobin: 15.2 g/dL (ref 13.0–17.0)
MCH: 29.8 pg (ref 26.0–34.0)
MCHC: 32 g/dL (ref 30.0–36.0)
MCV: 93.1 fL (ref 80.0–100.0)
Platelets: 254 10*3/uL (ref 150–400)
RBC: 5.1 MIL/uL (ref 4.22–5.81)
RDW: 13.3 % (ref 11.5–15.5)
WBC: 8.7 10*3/uL (ref 4.0–10.5)
nRBC: 0 % (ref 0.0–0.2)

## 2018-08-09 LAB — BASIC METABOLIC PANEL
Anion gap: 7 (ref 5–15)
BUN: 9 mg/dL (ref 8–23)
CO2: 23 mmol/L (ref 22–32)
Calcium: 9.1 mg/dL (ref 8.9–10.3)
Chloride: 107 mmol/L (ref 98–111)
Creatinine, Ser: 0.9 mg/dL (ref 0.61–1.24)
GFR calc Af Amer: >60 mL/min (ref >60)
GFR calc non Af Amer: >60 mL/min (ref >60)
Glucose, Bld: 105 mg/dL — ABNORMAL HIGH (ref 70–99)
Potassium: 4.2 mmol/L (ref 3.5–5.1)
Sodium: 137 mmol/L (ref 135–145)

## 2018-08-09 LAB — I-STAT TROPONIN, ED: Troponin i, poc: 0.22 ng/mL (ref 0.00–0.08)

## 2018-08-09 LAB — TROPONIN I: Troponin I: 0.9 ng/mL (ref <0.03)

## 2018-08-09 MED ORDER — ASPIRIN EC 81 MG PO TBEC
81.0000 mg | DELAYED_RELEASE_TABLET | Freq: Every day | ORAL | Status: DC
  Administered 2018-08-11: 81 mg via ORAL
  Filled 2018-08-09: qty 1

## 2018-08-09 MED ORDER — ASPIRIN 81 MG PO CHEW
81.0000 mg | CHEWABLE_TABLET | ORAL | Status: AC
  Administered 2018-08-10: 81 mg via ORAL
  Filled 2018-08-09: qty 1

## 2018-08-09 MED ORDER — METOPROLOL TARTRATE 12.5 MG HALF TABLET
12.5000 mg | ORAL_TABLET | Freq: Two times a day (BID) | ORAL | Status: DC
  Administered 2018-08-09 – 2018-08-10 (×2): 12.5 mg via ORAL
  Filled 2018-08-09 (×2): qty 1

## 2018-08-09 MED ORDER — SODIUM CHLORIDE 0.9% FLUSH
3.0000 mL | Freq: Two times a day (BID) | INTRAVENOUS | Status: DC
  Administered 2018-08-09 – 2018-08-10 (×2): 3 mL via INTRAVENOUS

## 2018-08-09 MED ORDER — ASPIRIN 81 MG PO CHEW
324.0000 mg | CHEWABLE_TABLET | ORAL | Status: AC
  Administered 2018-08-09: 324 mg via ORAL
  Filled 2018-08-09: qty 4

## 2018-08-09 MED ORDER — SODIUM CHLORIDE 0.9 % WEIGHT BASED INFUSION
1.0000 mL/kg/h | INTRAVENOUS | Status: DC
  Administered 2018-08-09 – 2018-08-10 (×2): 1 mL/kg/h via INTRAVENOUS

## 2018-08-09 MED ORDER — SODIUM CHLORIDE 0.9% FLUSH: 3.0000 mL | INTRAVENOUS | Status: DC | PRN

## 2018-08-09 MED ORDER — HEPARIN BOLUS VIA INFUSION
4000.0000 [IU] | Freq: Once | INTRAVENOUS | Status: AC
  Administered 2018-08-09: 4000 [IU] via INTRAVENOUS
  Filled 2018-08-09: qty 4000

## 2018-08-09 MED ORDER — NITROGLYCERIN 0.4 MG SL SUBL: 0.4000 mg | SUBLINGUAL_TABLET | SUBLINGUAL | Status: DC | PRN

## 2018-08-09 MED ORDER — SODIUM CHLORIDE 0.9% FLUSH
3.0000 mL | Freq: Once | INTRAVENOUS | Status: AC
  Administered 2018-08-09: 3 mL via INTRAVENOUS

## 2018-08-09 MED ORDER — ASPIRIN 300 MG RE SUPP: 300.0000 mg | RECTAL | Status: AC

## 2018-08-09 MED ORDER — HEPARIN (PORCINE) 25000 UT/250ML-% IV SOLN
1100.0000 [IU]/h | INTRAVENOUS | Status: DC
  Administered 2018-08-09 – 2018-08-10 (×2): 1100 [IU]/h via INTRAVENOUS
  Filled 2018-08-09 (×2): qty 250

## 2018-08-09 MED ORDER — ASPIRIN EC 81 MG PO TBEC: 81.0000 mg | DELAYED_RELEASE_TABLET | Freq: Every day | ORAL | Status: DC

## 2018-08-09 MED ORDER — ASPIRIN 81 MG PO CHEW: 324.0000 mg | CHEWABLE_TABLET | Freq: Once | ORAL | Status: DC

## 2018-08-09 MED ORDER — ACETAMINOPHEN 325 MG PO TABS: 650.0000 mg | ORAL_TABLET | ORAL | Status: DC | PRN

## 2018-08-09 MED ORDER — ATORVASTATIN CALCIUM 40 MG PO TABS
40.0000 mg | ORAL_TABLET | Freq: Every day | ORAL | Status: DC
  Administered 2018-08-09 – 2018-08-10 (×2): 40 mg via ORAL
  Filled 2018-08-09 (×2): qty 1

## 2018-08-09 MED ORDER — ONDANSETRON HCL 4 MG/2ML IJ SOLN: 4.0000 mg | Freq: Four times a day (QID) | INTRAMUSCULAR | Status: DC | PRN

## 2018-08-09 MED ORDER — SODIUM CHLORIDE 0.9 % IV SOLN: 250.0000 mL | INTRAVENOUS | Status: DC | PRN

## 2018-08-09 NOTE — ED Notes (Signed)
Lab results reported to Nurse in Triage.

## 2018-08-09 NOTE — Progress Notes (Signed)
ANTICOAGULATION CONSULT NOTE - Initial Consult  Pharmacy Consult for heparin Indication: chest pain/ACS  Allergies  Allergen Reactions  . Tetracyclines & Related Rash    Patient Measurements: Height: 5\' 9"  (175.3 cm) Weight: 200 lb (90.7 kg) IBW/kg (Calculated) : 70.7 Heparin Dosing Weight: 89.1kg  Vital Signs: Temp: 98.1 F (36.7 C) (02/19 1344) Temp Source: Oral (02/19 1344) BP: 165/99 (02/19 1445) Pulse Rate: 75 (02/19 1445)  Labs: Recent Labs    08/09/18 1355  HGB 15.2  HCT 47.5  PLT 254  CREATININE 0.90    Estimated Creatinine Clearance: 81.4 mL/min (by C-G formula based on SCr of 0.9 mg/dL).   Medical History: History reviewed. No pertinent past medical history.  Medications:  Infusions:  . sodium chloride    . heparin      Assessment: 41 yom presented to the ED with chest tightness. Troponin elevated and now starting IV heparin. Baseline CBC is WNL. He is not on anticoagulation PTA.   Goal of Therapy:  Heparin level 0.3-0.7 units/ml Monitor platelets by anticoagulation protocol: Yes   Plan:  Heparin bolus 4000 units IV x 1 Heparin gtt 1100 units/hr Check an 8 hr heparin level Daily heparin level and CBC  Letetia Romanello, Rande Lawman 08/09/2018,3:23 PM

## 2018-08-09 NOTE — Consult Note (Addendum)
The patient has been seen in conjunction with Ina Homes, MD. All aspects of care have been considered and discussed. The patient has been personally interviewed, examined, and all clinical data has been reviewed.   Non-ST elevation acute coronary syndrome.  Multiple untreated risk factors including hypertension and hyperlipidemia.  Prior smoker.  Needs early coronary angiography to define anatomy and help guide therapy.  Procedure and risks were discussed in detail including the possibility of stenting and requirement for dual antiplatelet therapy.  He is adverse to the idea of taking medication on a chronic basis.  We discussed in great detail that he will be on multiple medications at discharge and that the aim is to provide secondary prevention to protect against subsequent acute clinical problems.  The patient was counseled to undergo left heart catheterization, coronary angiography, and possible percutaneous coronary intervention with stent implantation. The procedural risks and benefits were discussed in detail. The risks discussed included death, stroke, myocardial infarction, life-threatening bleeding, limb ischemia, kidney injury, allergy, and possible emergency cardiac surgery. The risk of these significant complications were estimated to occur less than 1% of the time. After discussion, the patient has agreed to proceed.    CARDIOLOGY CONSULT NOTE    Patient ID: Franklin Ward; [id number]; Jul 30, 1945   Admit date: 08/09/2018 Date of Consult: 08/09/2018  Primary Care Provider: Shirline Frees, MD Primary Cardiologist: None Primary Electrophysiologist:  None   Patient Profile:   Franklin Ward is a 73 y.o. male with a hx of HTN who is being seen today for the evaluation of chest pain and elevated troponin.  History of Present Illness:   [name] was in his usual state of health until this morning when he developed recurrent intermittent substernal chest pressure  that lasted approximately 15 minutes in duration. He states that the initial episode was brought on by ambulation and resolved with rest. He then had an additional handful of episodes that occurred both with walking and with rest. All spontaneously resolved without intervention. There were no other associated symptoms. He did not try anything for the pain but felt he should get checked out. At baseline he tends to be very active and frequently works in the yard. He has never had any issues with exertional chest pressure or shortness of breath. He has never had a stress test or been told he had an arrhythmia.  He is a previous smoker but quit over 30 years ago. He does not use alcohol or illicit substances. He has no significant family history for heart disease. He tells me that he had his lipids checked approximately three months ago and was told everything was normal. Does tell me that whenever he does go to his doctor's office his blood pressure is usually in the 150s/80s however he believes that he has whitecoat syndrome and therefore has never been on any medications for hypertension.  He denies any recent fevers, chills, rhinorrhea, headaches, myalgias, arthralgias, shortness of breath, cough, abdominal pain, nausea/vomiting, diarrhea. He continually voices concerns about being started on multiple medications and tells me that he does not want to start medications.  History reviewed. No pertinent past medical history.  Past Surgical History:  Procedure Laterality Date  . NO PAST SURGERIES      Home Medications:  Prior to Admission medications   Medication Sig Start Date End Date Taking? Authorizing Provider  Ascorbic Acid (VITAMIN C PO) Take 1,000 mg by mouth daily.   Yes [provider]  aspirin 81  MG tablet Take 81 mg by mouth daily.   Yes [provider]  Calcium Carb-Cholecalciferol (CALCIUM 600 + D PO) Take 1 tablet by mouth daily.    Yes [provider]    carboxymethylcellulose (REFRESH PLUS) 0.5 % SOLN Place 1 drop into both eyes daily as needed (dry eyes).   Yes [provider]  cholecalciferol (VITAMIN D3) 25 MCG (1000 UT) tablet Take 1,000 Units by mouth daily.   Yes [provider]  Cyanocobalamin (B-12) 2500 MCG TABS Take 1 tablet by mouth daily.    Yes [provider]  Garlic 1610 MG CAPS Take 1,000 mg by mouth daily.    Yes [provider]  Misc Natural Products (OSTEO BI-FLEX ADV TRIPLE ST PO) Take 1 tablet by mouth 2 (two) times daily.    Yes [provider]  Multiple Vitamin (MULTIVITAMIN) tablet Take 1 tablet by mouth daily.   Yes [provider]  Omega-3 Fatty Acids (OMEGA-3 FISH OIL PO) Take 1 capsule by mouth daily.   Yes [provider]  Psyllium (METAMUCIL FIBER PO) Take by mouth See admin instructions. Mix 1 teaspoonful of powder into a glass of water and drink two to three times a day   Yes [provider]  saccharomyces boulardii (FLORASTOR) 250 MG capsule Take 250 mg by mouth 2 (two) times daily.   Yes [provider]  Turmeric 500 MG CAPS Take 500 mg by mouth daily.    Yes [provider]  vitamin E 400 UNIT capsule Take 400 Units by mouth daily.   Yes [provider]   Inpatient Medications: Scheduled Meds: . aspirin  324 mg Oral NOW   Or  . aspirin  300 mg Rectal NOW  . [START ON 08/10/2018] aspirin EC  81 mg Oral Daily  . metoprolol tartrate  12.5 mg Oral BID   Continuous Infusions: . sodium chloride    . heparin 1,100 Units/hr (08/09/18 1540)   PRN Meds: acetaminophen, nitroGLYCERIN, ondansetron (ZOFRAN) IV  Allergies:    Allergies  Allergen Reactions  . Tetracyclines & Related Rash   Social History:   Social History   Socioeconomic History  . Marital status: Single    Spouse name: Not on file  . Number of children: Not on file  . Years of education: Not on file  . Highest education level: Not on file   Occupational History  . Not on file  Social Needs  . Financial resource strain: Not on file  . Food insecurity:    Worry: Not on file    Inability: Not on file  . Transportation needs:    Medical: Not on file    Non-medical: Not on file  Tobacco Use  . Smoking status: Former Smoker    Last attempt to quit: 06/21/1980    Years since quitting: 38.1  . Smokeless tobacco: Current User    Types: Chew  Substance and Sexual Activity  . Alcohol use: No  . Drug use: No  . Sexual activity: Not on file  Lifestyle  . Physical activity:    Days per week: Not on file    Minutes per session: Not on file  . Stress: Not on file  Relationships  . Social connections:    Talks on phone: Not on file    Gets together: Not on file    Attends religious service: Not on file    Active member of club or organization: Not on file    Attends  meetings of clubs or organizations: Not on file    Relationship status: Not on file  . Intimate partner violence:    Fear of current or ex partner: Not on file    Emotionally abused: Not on file    Physically abused: Not on file    Forced sexual activity: Not on file  Other Topics Concern  . Not on file  Social History Narrative  . Not on file    Family History:   Family History  Problem Relation Age of Onset  . Lung cancer Father   . Colon cancer Neg Hx     ROS:  Performed and all others negative.  Physical Exam/Data:   Vitals:   08/09/18 1428 08/09/18 1430 08/09/18 1445 08/09/18 1500  BP:  (!) 160/89 (!) 165/99 (!) 157/90  Pulse:  75 75 73  Resp:  16 19 15   Temp:      TempSrc:      SpO2:  99% 98% 99%  Weight: 90.7 kg     Height: 5\' 9"  (1.753 m)      No intake or output data in the 24 hours ending 08/09/18 1638 Filed Weights   08/09/18 1428  Weight: 90.7 kg   Body mass index is 29.53 kg/m.   General:  Well nourished, well developed, in no acute distress HEENT: Normal Lymph: No adenopathy Neck: No JVD Endocrine:  No  thryomegaly Vascular: No carotid bruits; FA pulses 2+ bilaterally without bruits  Cardiac:  Normal S1, S2; RRR; no murmur  Lungs:  Clear to auscultation bilaterally, no wheezing, rhonchi or rales  Abd: Soft, nontender, no hepatomegaly  Ext: No edema Musculoskeletal:  No deformities, BUE and BLE strength normal and equal Skin: Warm and dry  Neuro:  CNs 2-12 intact, no focal abnormalities noted Psych:  Normal affect   EKG:  The EKG was personally reviewed and demonstrates: Sinus rhythm with normal axis and intervals. TWI inversion in leads I and aVL.   Telemetry:  Telemetry was personally reviewed and demonstrates: Sinus rhythm   Relevant CV Studies:  None  Laboratory Data:  Chemistry Recent Labs  Lab 08/09/18 1355  NA 137  K 4.2  CL 107  CO2 23  GLUCOSE 105*  BUN 9  CREATININE 0.90  CALCIUM 9.1  GFRNONAA >60  GFRAA >60  ANIONGAP 7    No results for input(s): PROT, ALBUMIN, AST, ALT, ALKPHOS, BILITOT in the last 168 hours.   Hematology Recent Labs  Lab 08/09/18 1355  WBC 8.7  RBC 5.10  HGB 15.2  HCT 47.5  MCV 93.1  MCH 29.8  MCHC 32.0  RDW 13.3  PLT 254   Cardiac EnzymesNo results for input(s): TROPONINI in the last 168 hours.   Recent Labs  Lab 08/09/18 1401  TROPIPOC 0.22*    BNPNo results for input(s): BNP, PROBNP in the last 168 hours.  DDimer No results for input(s): DDIMER in the last 168 hours.  Radiology/Studies:  Dg Chest 2 View  Result Date: 08/09/2018 CLINICAL DATA:  Chest pain and shortness of breath. EXAM: CHEST - 2 VIEW COMPARISON:  None available for comparison at time of study interpretation. FINDINGS: Cardiomediastinal silhouette is normal. Coronary artery calcification and/or stent. Bandlike densities LEFT lung base. No pleural effusion or focal consolidation. Mild hyperinflation. No pneumothorax. Soft tissue planes and included osseous structures are non suspicious. IMPRESSION: 1. Mild hyperinflation.  LEFT lung base  atelectasis/scarring. Electronically Signed   By: Elon Alas M.D.   On: 08/09/2018 15:16  Assessment and Plan:   Franklin Ward is a 73 y.o. male with a hx of HTN who is being seen today for the evaluation of chest pain and elevated troponin.  NSTE-ACS - Patient presented with symptoms consistent with angina and elevated troponin. This is consistent with an NSTEMI. I have reviewed his CT abdomen from 2018 that did show some aortic and iliac calcifications, along with possible LAD calcifications.  - Will trend troponin  - EKG in AM  - Started on IV heparin  - Agree with primary starting metoprolol  - He is now chest pain free; therefore, I would not start IV nitro  - Check lipid panel and hemoglobin A1c  - He is reluctant to having a heart cath or starting any new medications. He will talk with his family then make a decision   Probable Hypertension  - No on any medications - Primary has started metoprolol  - Continue to monitor, ideally would start ACE/ARB prior to discharge   Will discuss the case further with Dr. Tamala Julian.  For questions or updates, please contact [facility] Please consult [email or web address] for contact info under Cardiology/STEMI.   Signed, Ina Homes, MD 08/09/2018 4:38 PM

## 2018-08-09 NOTE — ED Triage Notes (Signed)
Patient complains of chest tightness since am, describes as intermittent. No associated symptoms. Alert and oriented, no pain on arrival

## 2018-08-09 NOTE — ED Notes (Signed)
Page sent to [facility] level Hospitalist for elevated troponin

## 2018-08-09 NOTE — H&P (Signed)
History and Physical    Franklin Ward  YIR:[id number]  DOB: Jul 11, 1945  DOA: 08/09/2018 PCP: Shirline Frees, MD   Patient coming from: home  Chief Complaint: chest pain  HPI: Franklin Ward is a 73 y.o. male with no medical history who presents for chest pressure. He had about 7-8 episodes of chest pressure today starting when he and his wife went out for breakfast. He continued to have episodes which lasted about 15 min and resolved with rest. He describes them as pressure like in the center of his chest without radiation or other symptoms. No h/o smoking, hyperlipidemia or family history of MIs. No h/o HTN but he is hypertensive in the ED. No medical problems. Not on any medications at home other than vitamins.   ED Course: BP 172/87- Troponin 0.22  Review of Systems:  All other systems reviewed and apart from HPI, are negative.  History reviewed. No pertinent past medical history.  Past Surgical History:  Procedure Laterality Date  . NO PAST SURGERIES      Social History:   reports that he quit smoking about 38 years ago. His smokeless tobacco use includes chew. He reports that he does not drink alcohol or use drugs.  Allergies  Allergen Reactions  . Tetracyclines & Related Rash    Family History  Problem Relation Age of Onset  . Lung cancer Father   . Colon cancer Neg Hx      Prior to Admission medications   Medication Sig Start Date End Date Taking? Authorizing Provider  Ascorbic Acid (VITAMIN C PO) Take 1,000 mg by mouth daily.   Yes [provider]  aspirin 81 MG tablet Take 81 mg by mouth daily.   Yes [provider]  Calcium Carb-Cholecalciferol (CALCIUM 600 + D PO) Take 1 tablet by mouth daily.    Yes [provider]  carboxymethylcellulose (REFRESH PLUS) 0.5 % SOLN Place 1 drop into both eyes daily as needed (dry eyes).   Yes [provider]  cholecalciferol (VITAMIN D3) 25 MCG (1000 UT) tablet Take 1,000 Units by  mouth daily.   Yes [provider]  Cyanocobalamin (B-12) 2500 MCG TABS Take 1 tablet by mouth daily.    Yes [provider]  Garlic 5009 MG CAPS Take 1,000 mg by mouth daily.    Yes [provider]  Misc Natural Products (OSTEO BI-FLEX ADV TRIPLE ST PO) Take 1 tablet by mouth 2 (two) times daily.    Yes [provider]  Multiple Vitamin (MULTIVITAMIN) tablet Take 1 tablet by mouth daily.   Yes [provider]  Omega-3 Fatty Acids (OMEGA-3 FISH OIL PO) Take 1 capsule by mouth daily.   Yes [provider]  Psyllium (METAMUCIL FIBER PO) Take by mouth See admin instructions. Mix 1 teaspoonful of powder into a glass of water and drink two to three times a day   Yes [provider]  saccharomyces boulardii (FLORASTOR) 250 MG capsule Take 250 mg by mouth 2 (two) times daily.   Yes [provider]  Turmeric 500 MG CAPS Take 500 mg by mouth daily.    Yes [provider]  vitamin E 400 UNIT capsule Take 400 Units by mouth daily.   Yes [provider]    Physical Exam: Wt Readings from Last 3 Encounters:  08/09/18 90.7 kg  12/09/16 90.7 kg  11/30/16 90.7 kg   Vitals:   08/09/18 1428 08/09/18 1430 08/09/18 1445 08/09/18 1500  BP:  Marland Kitchen)  160/89 (!) 165/99 (!) 157/90  Pulse:  75 75 73  Resp:  16 19 15   Temp:      TempSrc:      SpO2:  99% 98% 99%  Weight: 90.7 kg     Height: 5\' 9"  (1.753 m)         Constitutional:  Calm & comfortable Eyes: PERRLA, lids and conjunctivae normal ENT:  Mucous membranes are moist.  Pharynx clear of exudate   Normal dentition.  Neck: Supple, no masses  Respiratory:  Clear to auscultation bilaterally  Normal respiratory effort.  Cardiovascular:  S1 & S2 heard, regular rate and rhythm No Murmurs Abdomen:  Non distended No tenderness, No masses Bowel sounds normal Extremities:  No clubbing / cyanosis No pedal edema No joint deformity    Skin:  No rashes, lesions or  ulcers Neurologic:  AAO x 3 CN 2-12 grossly intact Sensation intact Strength 5/5 in all 4 extremities Psychiatric:  Normal Mood and affect    Labs on Admission: I have personally reviewed following labs and imaging studies  CBC: Recent Labs  Lab 08/09/18 1355  WBC 8.7  HGB 15.2  HCT 47.5  MCV 93.1  PLT 097   Basic Metabolic Panel: Recent Labs  Lab 08/09/18 1355  NA 137  K 4.2  CL 107  CO2 23  GLUCOSE 105*  BUN 9  CREATININE 0.90  CALCIUM 9.1   GFR: Estimated Creatinine Clearance: 81.4 mL/min (by C-G formula based on SCr of 0.9 mg/dL). Liver Function Tests: No results for input(s): AST, ALT, ALKPHOS, BILITOT, PROT, ALBUMIN in the last 168 hours. No results for input(s): LIPASE, AMYLASE in the last 168 hours. No results for input(s): AMMONIA in the last 168 hours. Coagulation Profile: No results for input(s): INR, PROTIME in the last 168 hours. Cardiac Enzymes: No results for input(s): CKTOTAL, CKMB, CKMBINDEX, TROPONINI in the last 168 hours. BNP (last 3 results) No results for input(s): PROBNP in the last 8760 hours. HbA1C: No results for input(s): HGBA1C in the last 72 hours. CBG: No results for input(s): GLUCAP in the last 168 hours. Lipid Profile: No results for input(s): CHOL, HDL, LDLCALC, TRIG, CHOLHDL, LDLDIRECT in the last 72 hours. Thyroid Function Tests: No results for input(s): TSH, T4TOTAL, FREET4, T3FREE, THYROIDAB in the last 72 hours. Anemia Panel: No results for input(s): VITAMINB12, FOLATE, FERRITIN, TIBC, IRON, RETICCTPCT in the last 72 hours. Urine analysis: No results found for: COLORURINE, APPEARANCEUR, LABSPEC, PHURINE, GLUCOSEU, HGBUR, BILIRUBINUR, KETONESUR, PROTEINUR, UROBILINOGEN, NITRITE, LEUKOCYTESUR Sepsis Labs: @LABRCNTIP (procalcitonin:4,lacticidven:4) )No results found for this or any previous visit (from the past 240 hour(s)).   Radiological Exams on Admission: Dg Chest 2 View  Result Date: 08/09/2018 CLINICAL DATA:   Chest pain and shortness of breath. EXAM: CHEST - 2 VIEW COMPARISON:  None available for comparison at time of study interpretation. FINDINGS: Cardiomediastinal silhouette is normal. Coronary artery calcification and/or stent. Bandlike densities LEFT lung base. No pleural effusion or focal consolidation. Mild hyperinflation. No pneumothorax. Soft tissue planes and included osseous structures are non suspicious. IMPRESSION: 1. Mild hyperinflation.  LEFT lung base atelectasis/scarring. Electronically Signed   By: Elon Alas M.D.   On: 08/09/2018 15:16    EKG: Independently reviewed. Normal sinus rhythm  Assessment/Plan Principal Problem:   NSTEMI (non-ST elevated myocardial infarction)   - Heparin started by ED - will order aspirin and Metoprolol - check Lipids and A1c -currently chest pain free- cardiology has been consulted  HTN - starting Metoprolol   DVT prophylaxis:  Heparin infusion Code Status: full code  Family Communication: wife at bedside  Disposition Plan: home when stable  Consults called: cardiology called by ED  Admission status: inpatient    Debbe Odea MD [facility] Pager: [email or web address] Password [email or web address] 7PM-7AM, please contact night-coverage   08/09/2018, 4:29 PM

## 2018-08-09 NOTE — ED Provider Notes (Signed)
[facility]GENCY DEPARTMENT Provider Note   CSN: [id number] Arrival date & time: 08/09/18  1336    History   Chief Complaint Chief Complaint  Patient presents with  . Chest Pain    HPI Franklin Ward is a 73 y.o. male.     The history is provided by the patient.  Chest Pain  Pain location:  Substernal area Pain quality: pressure   Pain radiates to:  Does not radiate Pain severity:  Moderate Onset quality:  Sudden Timing:  Intermittent Progression:  Resolved Chronicity:  New Context: at rest   Relieved by:  Rest Worsened by:  Nothing Associated symptoms: no abdominal pain, no back pain, no claudication, no cough, no fever, no nausea, no palpitations, no PND, no shortness of breath, no vomiting and no weakness   Risk factors: male sex   Risk factors: no coronary artery disease, no high cholesterol, no hypertension, no prior DVT/PE and no smoking     History reviewed. No pertinent past medical history.  Patient Active Problem List   Diagnosis Date Noted  . NSTEMI (non-ST elevated myocardial infarction) ([place]) 08/09/2018  . Abscess of sigmoid colon due to diverticulitis 07/06/2016    Past Surgical History:  Procedure Laterality Date  . NO PAST SURGERIES          Home Medications    Prior to Admission medications   Medication Sig Start Date End Date Taking? Authorizing Provider  Ascorbic Acid (VITAMIN C PO) Take 1,000 mg by mouth daily.   Yes [provider]  aspirin 81 MG tablet Take 81 mg by mouth daily.   Yes [provider]  Calcium Carb-Cholecalciferol (CALCIUM 600 + D PO) Take 1 tablet by mouth daily.    Yes [provider]  carboxymethylcellulose (REFRESH PLUS) 0.5 % SOLN Place 1 drop into both eyes daily as needed (dry eyes).   Yes [provider]  cholecalciferol (VITAMIN D3) 25 MCG (1000 UT) tablet Take 1,000 Units by mouth daily.   Yes [provider]  Cyanocobalamin (B-12) 2500  MCG TABS Take 1 tablet by mouth daily.    Yes [provider]  Garlic 7253 MG CAPS Take 1,000 mg by mouth daily.    Yes [provider]  Misc Natural Products (OSTEO BI-FLEX ADV TRIPLE ST PO) Take 1 tablet by mouth 2 (two) times daily.    Yes [provider]  Multiple Vitamin (MULTIVITAMIN) tablet Take 1 tablet by mouth daily.   Yes [provider]  Omega-3 Fatty Acids (OMEGA-3 FISH OIL PO) Take 1 capsule by mouth daily.   Yes [provider]  Psyllium (METAMUCIL FIBER PO) Take by mouth See admin instructions. Mix 1 teaspoonful of powder into a glass of water and drink two to three times a day   Yes [provider]  saccharomyces boulardii (FLORASTOR) 250 MG capsule Take 250 mg by mouth 2 (two) times daily.   Yes [provider]  Turmeric 500 MG CAPS Take 500 mg by mouth daily.    Yes [provider]  vitamin E 400 UNIT capsule Take 400 Units by mouth daily.   Yes [provider]    Family History Family History  Problem Relation Age of Onset  . Lung cancer Father   . Colon cancer Neg Hx     Social History Social History   Tobacco Use  . Smoking status: Former Smoker    Last attempt to quit: 06/21/1980    Years since  quitting: 38.1  . Smokeless tobacco: Current User    Types: Chew  Substance Use Topics  . Alcohol use: No  . Drug use: No     Allergies   Tetracyclines & related   Review of Systems Review of Systems  Constitutional: Negative for chills and fever.  HENT: Negative for ear pain and sore throat.   Eyes: Negative for pain and visual disturbance.  Respiratory: Negative for cough and shortness of breath.   Cardiovascular: Positive for chest pain. Negative for palpitations, claudication and PND.  Gastrointestinal: Negative for abdominal pain, nausea and vomiting.  Genitourinary: Negative for dysuria and hematuria.  Musculoskeletal: Negative for arthralgias and back pain.  Skin: Negative  for color change and rash.  Neurological: Negative for seizures, syncope and weakness.  All other systems reviewed and are negative.    Physical Exam Updated Vital Signs BP (!) 157/90   Pulse 73   Temp 98.1 F (36.7 C) (Oral)   Resp 15   Ht 5\' 9"  (1.753 m)   Wt 90.7 kg   SpO2 99%   BMI 29.53 kg/m   Physical Exam Vitals signs and nursing note reviewed.  Constitutional:      Appearance: He is well-developed.  HENT:     Head: Normocephalic and atraumatic.  Eyes:     Conjunctiva/sclera: Conjunctivae normal.     Pupils: Pupils are equal, round, and reactive to light.  Neck:     Musculoskeletal: Normal range of motion and neck supple.  Cardiovascular:     Rate and Rhythm: Normal rate and regular rhythm.     Pulses:          Radial pulses are 2+ on the right side and 2+ on the left side.       Dorsalis pedis pulses are 2+ on the right side and 2+ on the left side.     Heart sounds: Normal heart sounds. No murmur.  Pulmonary:     Effort: Pulmonary effort is normal. No respiratory distress.     Breath sounds: Normal breath sounds. No decreased breath sounds, wheezing, rhonchi or rales.  Abdominal:     Palpations: Abdomen is soft.     Tenderness: There is no abdominal tenderness.  Musculoskeletal:     Right lower leg: Edema present.     Left lower leg: Edema present.  Skin:    General: Skin is warm and dry.     Capillary Refill: Capillary refill takes less than 2 seconds.  Neurological:     General: No focal deficit present.     Mental Status: He is alert.      ED Treatments / Results  Labs (all labs ordered are listed, but only abnormal results are displayed) Labs Reviewed  BASIC METABOLIC PANEL - Abnormal; Notable for the following components:      Result Value   Glucose, Bld 105 (*)    All other components within normal limits  I-STAT TROPONIN, ED - Abnormal; Notable for the following components:   Troponin i, poc 0.22 (*)    All other components within  normal limits  CBC  HEPARIN LEVEL (UNFRACTIONATED)  TROPONIN I  TROPONIN I  TROPONIN I    EKG EKG Interpretation  Date/Time:  Wednesday August 09 2018 13:42:15 EST Ventricular Rate:  84 PR Interval:  144 QRS Duration: 90 QT Interval:  342 QTC Calculation: 404 R Axis:   12 Text Interpretation:  Normal sinus rhythm Nonspecific ST and T wave abnormality Abnormal ECG No prio rECG  for comparison.  No STEMI Confirmed by Antony Blackbird [id number]) on 08/09/2018 2:31:05 PM   Radiology Dg Chest 2 View  Result Date: 08/09/2018 CLINICAL DATA:  Chest pain and shortness of breath. EXAM: CHEST - 2 VIEW COMPARISON:  None available for comparison at time of study interpretation. FINDINGS: Cardiomediastinal silhouette is normal. Coronary artery calcification and/or stent. Bandlike densities LEFT lung base. No pleural effusion or focal consolidation. Mild hyperinflation. No pneumothorax. Soft tissue planes and included osseous structures are non suspicious. IMPRESSION: 1. Mild hyperinflation.  LEFT lung base atelectasis/scarring. Electronically Signed   By: Elon Alas M.D.   On: 08/09/2018 15:16    Procedures .Critical Care Performed by: Lennice Sites, DO Authorized by: Lennice Sites, DO   Critical care provider statement:    Critical care time (minutes):  40   Critical care was necessary to treat or prevent imminent or life-threatening deterioration of the following conditions:  Cardiac failure   Critical care was time spent personally by me on the following activities:  Blood draw for specimens, development of treatment plan with patient or surrogate, discussions with consultants, discussions with primary provider, evaluation of patient's response to treatment, examination of patient, obtaining history from patient or surrogate, ordering and performing treatments and interventions, ordering and review of laboratory studies, ordering and review of radiographic studies, pulse oximetry,  re-evaluation of patient's condition and review of old charts   I assumed direction of critical care for this patient from another provider in my specialty: no     (including critical care time)  Medications Ordered in ED Medications  heparin ADULT infusion 100 units/mL (25000 units/234mL sodium chloride 0.45%) (1,100 Units/hr Intravenous New Bag/Given 08/09/18 1540)  aspirin EC tablet 81 mg (has no administration in time range)  nitroGLYCERIN (NITROSTAT) SL tablet 0.4 mg (has no administration in time range)  acetaminophen (TYLENOL) tablet 650 mg (has no administration in time range)  ondansetron (ZOFRAN) injection 4 mg (has no administration in time range)  aspirin chewable tablet 324 mg (has no administration in time range)    Or  aspirin suppository 300 mg (has no administration in time range)  metoprolol tartrate (LOPRESSOR) tablet 12.5 mg (has no administration in time range)  sodium chloride flush (NS) 0.9 % injection 3 mL (3 mLs Intravenous Given 08/09/18 1543)  heparin bolus via infusion 4,000 Units (4,000 Units Intravenous Bolus from Bag 08/09/18 1540)     Initial Impression / Assessment and Plan / ED Course  I have reviewed the triage vital signs and the nursing notes.  Pertinent labs & imaging results that were available during my care of the patient were reviewed by me and considered in my medical decision making (see chart for details).        Franklin Ward is a 73 year old male with history of diverticulitis who presents to the ED with chest pain.  Patient with normal vitals except for mild hypertension.  Patient states that he had substernal chest pain earlier this morning that was intermittent for about 3 to 4 hours.  Has resolved for the last 2 hours.  Patient is currently chest pain-free.  EKG shows no ischemic changes.  No ST changes that are obvious.  Patient with elevated troponin.  No DVT or PE risk factors.  Patient is a previous smoker.  No infectious symptoms.   Chest x-ray showed no signs of volume overload, pneumonia, pneumothorax.  Does show coronary calcifications.  Patient with history and physical that is not consistent with dissection.  Has  good pulses throughout.  Talked with cardiology and they will evaluate the patient for further ACS care.  Will start IV heparin bolus and drip for non-ST elevation MI.  Patient admitted to hospitalist service for further care.  Hemodynamically stable throughout my care.  Was given aspirin.  This chart was dictated using voice recognition software.  Despite best efforts to proofread,  errors can occur which can change the documentation meaning.    Final Clinical Impressions(s) / ED Diagnoses   Final diagnoses:  NSTEMI (non-ST elevated myocardial infarction) [facility])    ED Discharge Orders    None       Lennice Sites, DO 08/09/18 1619

## 2018-08-10 ENCOUNTER — Other Ambulatory Visit: Payer: Self-pay

## 2018-08-10 ENCOUNTER — Encounter (HOSPITAL_COMMUNITY): Payer: Self-pay | Admitting: General Practice

## 2018-08-10 ENCOUNTER — Encounter (HOSPITAL_COMMUNITY)
Admission: EM | Disposition: A | Discharge status: Home / Self Care | Payer: Self-pay | Source: Home / Self Care | Attending: Family Medicine

## 2018-08-10 DIAGNOSIS — I2510 Atherosclerotic heart disease of native coronary artery without angina pectoris: Secondary | ICD-10-CM

## 2018-08-10 HISTORY — PX: LEFT HEART CATH AND CORONARY ANGIOGRAPHY: CATH118249

## 2018-08-10 HISTORY — PX: CORONARY STENT INTERVENTION: CATH118234

## 2018-08-10 LAB — CBC
HCT: 44.2 % (ref 39.0–52.0)
Hemoglobin: 14 g/dL (ref 13.0–17.0)
MCH: 29.1 pg (ref 26.0–34.0)
MCHC: 31.7 g/dL (ref 30.0–36.0)
MCV: 91.9 fL (ref 80.0–100.0)
Platelets: 235 10*3/uL (ref 150–400)
RBC: 4.81 MIL/uL (ref 4.22–5.81)
RDW: 13.4 % (ref 11.5–15.5)
WBC: 8.9 10*3/uL (ref 4.0–10.5)
nRBC: 0 % (ref 0.0–0.2)

## 2018-08-10 LAB — LIPID PANEL
Cholesterol: 189 mg/dL (ref 0–200)
HDL: 35 mg/dL — ABNORMAL LOW (ref >40)
LDL Cholesterol: 139 mg/dL — ABNORMAL HIGH (ref 0–99)
Total CHOL/HDL Ratio: 5.4 RATIO
Triglycerides: 73 mg/dL (ref <150)
VLDL: 15 mg/dL (ref 0–40)

## 2018-08-10 LAB — POCT ACTIVATED CLOTTING TIME
ACTIVATED CLOTTING TIME: 505 s
Activated Clotting Time: 230 seconds

## 2018-08-10 LAB — HEPARIN LEVEL (UNFRACTIONATED)
Heparin Unfractionated: 0.56 IU/mL (ref 0.30–0.70)
Heparin Unfractionated: 0.59 IU/mL (ref 0.30–0.70)

## 2018-08-10 LAB — TROPONIN I
Troponin I: 3.32 ng/mL (ref <0.03)
Troponin I: 4.17 ng/mL (ref <0.03)

## 2018-08-10 SURGERY — LEFT HEART CATH AND CORONARY ANGIOGRAPHY
Anesthesia: LOCAL

## 2018-08-10 MED ORDER — HEPARIN SODIUM (PORCINE) 1000 UNIT/ML IJ SOLN
INTRAMUSCULAR | Status: AC
  Filled 2018-08-10: qty 1

## 2018-08-10 MED ORDER — METOPROLOL TARTRATE 50 MG PO TABS
50.0000 mg | ORAL_TABLET | Freq: Two times a day (BID) | ORAL | Status: DC
  Administered 2018-08-10 – 2018-08-11 (×2): 50 mg via ORAL
  Filled 2018-08-10 (×2): qty 1
  Filled 2018-08-10: qty 2

## 2018-08-10 MED ORDER — VERAPAMIL HCL 2.5 MG/ML IV SOLN
INTRAVENOUS | Status: AC
  Filled 2018-08-10: qty 2

## 2018-08-10 MED ORDER — HEART ATTACK BOUNCING BOOK
Freq: Once | Status: AC
  Administered 2018-08-11: 05:00:00
  Filled 2018-08-10: qty 1

## 2018-08-10 MED ORDER — TICAGRELOR 90 MG PO TABS
ORAL_TABLET | ORAL | Status: DC | PRN
  Administered 2018-08-10: 180 mg via ORAL

## 2018-08-10 MED ORDER — ISOSORBIDE MONONITRATE ER 30 MG PO TB24
30.0000 mg | ORAL_TABLET | Freq: Every day | ORAL | Status: DC
  Administered 2018-08-10 – 2018-08-11 (×2): 30 mg via ORAL
  Filled 2018-08-10 (×2): qty 1

## 2018-08-10 MED ORDER — FUROSEMIDE 10 MG/ML IJ SOLN
20.0000 mg | Freq: Once | INTRAMUSCULAR | Status: AC
  Administered 2018-08-10: 20:00:00 20 mg via INTRAVENOUS
  Filled 2018-08-10: qty 2

## 2018-08-10 MED ORDER — LABETALOL HCL 5 MG/ML IV SOLN: 10.0000 mg | INTRAVENOUS | Status: AC | PRN

## 2018-08-10 MED ORDER — ANGIOPLASTY BOOK
Freq: Once | Status: AC
  Administered 2018-08-11: 05:00:00
  Filled 2018-08-10: qty 1

## 2018-08-10 MED ORDER — SODIUM CHLORIDE 0.9 % IV SOLN: 250.0000 mL | INTRAVENOUS | Status: DC | PRN

## 2018-08-10 MED ORDER — HYDRALAZINE HCL 20 MG/ML IJ SOLN: 5.0000 mg | INTRAMUSCULAR | Status: AC | PRN

## 2018-08-10 MED ORDER — HEPARIN SODIUM (PORCINE) 1000 UNIT/ML IJ SOLN
INTRAMUSCULAR | Status: DC | PRN
  Administered 2018-08-10: 4000 [IU] via INTRAVENOUS
  Administered 2018-08-10: 5000 [IU] via INTRAVENOUS

## 2018-08-10 MED ORDER — SODIUM CHLORIDE 0.9% FLUSH
3.0000 mL | Freq: Two times a day (BID) | INTRAVENOUS | Status: DC
  Administered 2018-08-11: 3 mL via INTRAVENOUS

## 2018-08-10 MED ORDER — MIDAZOLAM HCL 2 MG/2ML IJ SOLN
INTRAMUSCULAR | Status: AC
  Filled 2018-08-10: qty 2

## 2018-08-10 MED ORDER — HEPARIN (PORCINE) IN NACL 1000-0.9 UT/500ML-% IV SOLN
INTRAVENOUS | Status: DC | PRN
  Administered 2018-08-10 (×2): 500 mL

## 2018-08-10 MED ORDER — HEPARIN (PORCINE) IN NACL 1000-0.9 UT/500ML-% IV SOLN
INTRAVENOUS | Status: AC
  Filled 2018-08-10: qty 500

## 2018-08-10 MED ORDER — IOHEXOL 350 MG/ML SOLN
INTRAVENOUS | Status: DC | PRN
  Administered 2018-08-10: 190 mL via INTRACARDIAC

## 2018-08-10 MED ORDER — SODIUM CHLORIDE 0.9% FLUSH: 3.0000 mL | INTRAVENOUS | Status: DC | PRN

## 2018-08-10 MED ORDER — FENTANYL CITRATE (PF) 100 MCG/2ML IJ SOLN
INTRAMUSCULAR | Status: DC | PRN
  Administered 2018-08-10: 50 ug via INTRAVENOUS
  Administered 2018-08-10: 25 ug via INTRAVENOUS

## 2018-08-10 MED ORDER — SODIUM CHLORIDE 0.9 % WEIGHT BASED INFUSION: 1.0000 mL/kg/h | INTRAVENOUS | Status: AC

## 2018-08-10 MED ORDER — ENOXAPARIN SODIUM 40 MG/0.4ML ~~LOC~~ SOLN
40.0000 mg | SUBCUTANEOUS | Status: DC
  Administered 2018-08-11: 40 mg via SUBCUTANEOUS
  Filled 2018-08-10: qty 0.4

## 2018-08-10 MED ORDER — LIDOCAINE HCL (PF) 1 % IJ SOLN
INTRAMUSCULAR | Status: AC
  Filled 2018-08-10: qty 30

## 2018-08-10 MED ORDER — MIDAZOLAM HCL 2 MG/2ML IJ SOLN
INTRAMUSCULAR | Status: DC | PRN
  Administered 2018-08-10: 1 mg via INTRAVENOUS

## 2018-08-10 MED ORDER — NITROGLYCERIN 1 MG/10 ML FOR IR/CATH LAB
INTRA_ARTERIAL | Status: DC | PRN
  Administered 2018-08-10 (×3): 200 ug via INTRACORONARY

## 2018-08-10 MED ORDER — LIDOCAINE HCL (PF) 1 % IJ SOLN
INTRAMUSCULAR | Status: DC | PRN
  Administered 2018-08-10: 2 mL

## 2018-08-10 MED ORDER — VERAPAMIL HCL 2.5 MG/ML IV SOLN
INTRAVENOUS | Status: DC | PRN
  Administered 2018-08-10: 10 mL via INTRA_ARTERIAL

## 2018-08-10 MED ORDER — TICAGRELOR 90 MG PO TABS
90.0000 mg | ORAL_TABLET | Freq: Two times a day (BID) | ORAL | Status: DC
  Administered 2018-08-11: 90 mg via ORAL
  Filled 2018-08-10 (×2): qty 1

## 2018-08-10 MED ORDER — TICAGRELOR 90 MG PO TABS
ORAL_TABLET | ORAL | Status: AC
  Filled 2018-08-10: qty 2

## 2018-08-10 MED ORDER — FENTANYL CITRATE (PF) 100 MCG/2ML IJ SOLN
INTRAMUSCULAR | Status: AC
  Filled 2018-08-10: qty 2

## 2018-08-10 MED ORDER — SODIUM CHLORIDE 0.9 % IV SOLN
INTRAVENOUS | Status: AC | PRN
  Administered 2018-08-10: 1000 mL via INTRAVENOUS

## 2018-08-10 SURGICAL SUPPLY — 18 items
BALLN SAPPHIRE 2.5X15 (BALLOONS) ×2
BALLOON SAPPHIRE 2.5X15 (BALLOONS) ×1 IMPLANT
CATH 5FR JL3.5 JR4 ANG PIG MP (CATHETERS) ×2 IMPLANT
CATH LAUNCHER 6FR AL1 (CATHETERS) ×1 IMPLANT
CATHETER LAUNCHER 6FR AL1 (CATHETERS) ×2
DEVICE RAD COMP TR BAND LRG (VASCULAR PRODUCTS) ×2 IMPLANT
GLIDESHEATH SLEND SS 6F .021 (SHEATH) ×2 IMPLANT
GUIDEWIRE INQWIRE 1.5J.035X260 (WIRE) ×1 IMPLANT
INQWIRE 1.5J .035X260CM (WIRE) ×2
KIT ENCORE 26 ADVANTAGE (KITS) ×2 IMPLANT
KIT HEART LEFT (KITS) ×2 IMPLANT
PACK CARDIAC CATHETERIZATION (CUSTOM PROCEDURE TRAY) ×2 IMPLANT
STENT RESOLUTE ONYX 4.0X15 (Permanent Stent) ×2 IMPLANT
STENT RESOLUTE ONYX 4.0X22 (Permanent Stent) ×2 IMPLANT
SYR MEDRAD MARK 7 150ML (SYRINGE) ×2 IMPLANT
TRANSDUCER W/STOPCOCK (MISCELLANEOUS) ×2 IMPLANT
TUBING CIL FLEX 10 FLL-RA (TUBING) ×2 IMPLANT
WIRE ASAHI PROWATER 180CM (WIRE) ×2 IMPLANT

## 2018-08-10 NOTE — Progress Notes (Signed)
[name] for heparin Indication: chest pain/ACS  Allergies  Allergen Reactions  . Tetracyclines & Related Rash    Patient Measurements: Height: 5\' 9"  (175.3 cm) Weight: 200 lb (90.7 kg) IBW/kg (Calculated) : 70.7 Heparin Dosing Weight: 89.1kg  Vital Signs: Temp: 98.1 F (36.7 C) (02/19 1344) Temp Source: Oral (02/19 1344) BP: 159/91 (02/19 2300) Pulse Rate: 68 (02/19 2300)  Labs: Recent Labs    08/09/18 1355 08/09/18 1749 08/09/18 2328  HGB 15.2  --   --   HCT 47.5  --   --   PLT 254  --   --   HEPARINUNFRC  --   --  0.56  CREATININE 0.90  --   --   TROPONINI  --  0.90*  --     Estimated Creatinine Clearance: 81.4 mL/min (by C-G formula based on SCr of 0.9 mg/dL).   Medical History: History reviewed. No pertinent past medical history.  Medications:  Infusions:  . sodium chloride    . sodium chloride    . sodium chloride 1 mL/kg/hr (08/09/18 2011)  . heparin 1,100 Units/hr (08/09/18 1540)    Assessment: 37 yom presented to the ED with chest tightness. Troponin elevated and now starting IV heparin. Baseline CBC is WNL. He is not on anticoagulation PTA.  Initial heparin level therapeutic  Goal of Therapy:  Heparin level 0.3-0.7 units/ml Monitor platelets by anticoagulation protocol: Yes   Plan:  Continue heparin gtt 1100 units/hr Daily heparin level and CBC  Franklin Ward 08/10/2018,12:27 AM

## 2018-08-10 NOTE — Progress Notes (Addendum)
The patient has been seen in conjunction with Ina Homes, MD. All aspects of care have been considered and discussed. The patient has been personally interviewed, examined, and all clinical data has been reviewed.   Acute coronary syndrome/unstable angina.  Awaiting coronary angiography later this afternoon.  Further management will be dependent upon findings.   Progress Note  Patient Name: Franklin Ward Date of Encounter: 08/10/2018  Primary Cardiologist: No primary care provider on file.   Subjective   Patient doing well this AM. No recurrence of his CP or SOB. He understands the plan for cath today. All questions and concerns addressed.   Inpatient Medications    Scheduled Meds: . [START ON 08/11/2018] aspirin EC  81 mg Oral Daily  . atorvastatin  40 mg Oral QHS  . metoprolol tartrate  12.5 mg Oral BID  . sodium chloride flush  3 mL Intravenous Q12H   Continuous Infusions: . sodium chloride    . sodium chloride 1 mL/kg/hr (08/10/18 0644)  . heparin 1,100 Units/hr (08/10/18 0643)   PRN Meds: sodium chloride, acetaminophen, nitroGLYCERIN, ondansetron (ZOFRAN) IV, sodium chloride flush   Vital Signs    Vitals:   08/09/18 2300 08/10/18 0107 08/10/18 0414 08/10/18 0815  BP: (!) 159/91 (!) 152/81 135/81 134/81  Pulse: 68 65 64 68  Resp: 16 16 20 18   Temp:  97.7 F (36.5 C) (!) 97.4 F (36.3 C) 98.6 F (37 C)  TempSrc:  Oral Oral Oral  SpO2: 98% 96% 96% 97%  Weight:  91.2 kg    Height:  5\' 9"  (1.753 m)      Intake/Output Summary (Last 24 hours) at 08/10/2018 0851 Last data filed at 08/10/2018 0800 Gross per 24 hour  Intake 1100.36 ml  Output 1125 ml  Net -24.64 ml   Filed Weights   08/09/18 1428 08/10/18 0107  Weight: 90.7 kg 91.2 kg    Telemetry    NSR - Personally Reviewed  ECG    NSR with TWI in leads aVL, V1 and V2- Personally Reviewed  Physical Exam   Today's Vitals   08/09/18 2344 08/10/18 0107 08/10/18 0414 08/10/18 0815  BP:  (!)  152/81 135/81 134/81  Pulse:  65 64 68  Resp:  16 20 18   Temp:  97.7 F (36.5 C) (!) 97.4 F (36.3 C) 98.6 F (37 C)  TempSrc:  Oral Oral Oral  SpO2:  96% 96% 97%  Weight:  91.2 kg    Height:  5\' 9"  (1.753 m)    PainSc: 0-No pain   0-No pain   Body mass index is 29.68 kg/m.  GEN: No acute distress.   Neck: No JVD Cardiac: RRR, no murmurs, rubs, or gallops.  Respiratory: Clear to auscultation bilaterally. GI: Soft, nontender, non-distended  MS: No edema; No deformity. Neuro:  Nonfocal  Psych: Normal affect   Labs    Chemistry Recent Labs  Lab 08/09/18 1355  NA 137  K 4.2  CL 107  CO2 23  GLUCOSE 105*  BUN 9  CREATININE 0.90  CALCIUM 9.1  GFRNONAA >60  GFRAA >60  ANIONGAP 7    Hematology Recent Labs  Lab 08/09/18 1355 08/10/18 0403  WBC 8.7 8.9  RBC 5.10 4.81  HGB 15.2 14.0  HCT 47.5 44.2  MCV 93.1 91.9  MCH 29.8 29.1  MCHC 32.0 31.7  RDW 13.3 13.4  PLT 254 235   Cardiac Enzymes Recent Labs  Lab 08/09/18 1749 08/09/18 2328 08/10/18 0403  TROPONINI 0.90* 3.32*  4.17*    Recent Labs  Lab 08/09/18 1401  TROPIPOC 0.22*    BNPNo results for input(s): BNP, PROBNP in the last 168 hours.   DDimer No results for input(s): DDIMER in the last 168 hours.   Radiology    Dg Chest 2 View  Result Date: 08/09/2018 CLINICAL DATA:  Chest pain and shortness of breath. EXAM: CHEST - 2 VIEW COMPARISON:  None available for comparison at time of study interpretation. FINDINGS: Cardiomediastinal silhouette is normal. Coronary artery calcification and/or stent. Bandlike densities LEFT lung base. No pleural effusion or focal consolidation. Mild hyperinflation. No pneumothorax. Soft tissue planes and included osseous structures are non suspicious. IMPRESSION: 1. Mild hyperinflation.  LEFT lung base atelectasis/scarring. Electronically Signed   By: Elon Alas M.D.   On: 08/09/2018 15:16   Cardiac Studies   None  Patient Profile     Franklin Ward is a  73 y.o. male with a hx of HTN who is being seen today for the evaluation of chest pain and elevated troponin.  Assessment & Plan    NSTE-ACS - No CP or SHOB since admission  - Troponin up to 4.17  - EKG with some new TWI in leads V1-V2 - Continue IV heparin  - Continue ASA, Metoprolol, and Atorvastatin  - Plan for cath today  - Echocardiogram after heart cath - F/u A1c   Hypertension  - Continue metoprolol  - Start ACE/ARB prior to discharge   HLD - LDL 139  - Continue Atorvastatin   Will discuss the case further with Dr. Tamala Julian.  For questions or updates, please contact [place] Please consult [email or web address] for contact info under Cardiology/STEMI.   Signed, Ina Homes, MD  08/10/2018, 8:51 AM

## 2018-08-10 NOTE — Interval H&P Note (Signed)
History and Physical Interval Note:  08/10/2018 5:25 PM  Fadi Daphene Jaeger  has presented today for surgery, with the diagnosis of [place]  The various methods of treatment have been discussed with the patient and family. After consideration of risks, benefits and other options for treatment, the patient has consented to  Procedure(s): LEFT HEART CATH AND CORONARY ANGIOGRAPHY (N/A) CORONARY STENT INTERVENTION (N/A) as a surgical intervention .  The patient's history has been reviewed, patient examined, no change in status, stable for surgery.  I have reviewed the patient's chart and labs.  Questions were answered to the patient's satisfaction.   Cath Lab Visit (complete for each Cath Lab visit)  Clinical Evaluation Leading to the Procedure:   ACS: Yes.    Non-ACS:    Anginal Classification: CCS IV  Anti-ischemic medical therapy: No Therapy  Non-Invasive Test Results: No non-invasive testing performed  Prior CABG: No previous CABG        Collier [facility]ina [facility] 08/10/2018

## 2018-08-10 NOTE — Progress Notes (Signed)
Patient down for cath lab.

## 2018-08-10 NOTE — Progress Notes (Signed)
TR BAND REMOVAL  LOCATION:    right radial  DEFLATED PER PROTOCOL:    Yes.    TIME BAND OFF / DRESSING APPLIED:    2145   SITE UPON ARRIVAL:    Level 0  SITE AFTER BAND REMOVAL:    Level 0  CIRCULATION SENSATION AND MOVEMENT:    Within Normal Limits   Yes.    Reviewed restrictions with patient, verbalized good understanding

## 2018-08-10 NOTE — Plan of Care (Signed)
  Problem: Activity: Goal: Ability to tolerate increased activity will improve Outcome: Progressing   Problem: Cardiac: Goal: Ability to achieve and maintain adequate cardiovascular perfusion will improve Outcome: Progressing   Problem: Education: Goal: Knowledge of General Education information will improve Description: Including pain rating scale, medication(s)/side effects and non-pharmacologic comfort measures Outcome: Progressing   

## 2018-08-10 NOTE — H&P (View-Only) (Signed)
The patient has been seen in conjunction with Ina Homes, MD. All aspects of care have been considered and discussed. The patient has been personally interviewed, examined, and all clinical data has been reviewed.   Acute coronary syndrome/unstable angina.  Awaiting coronary angiography later this afternoon.  Further management will be dependent upon findings.   Progress Note  Patient Name: Franklin Ward Date of Encounter: 08/10/2018  Primary Cardiologist: No primary care provider on file.   Subjective   Patient doing well this AM. No recurrence of his CP or SOB. He understands the plan for cath today. All questions and concerns addressed.   Inpatient Medications    Scheduled Meds: . [START ON 08/11/2018] aspirin EC  81 mg Oral Daily  . atorvastatin  40 mg Oral QHS  . metoprolol tartrate  12.5 mg Oral BID  . sodium chloride flush  3 mL Intravenous Q12H   Continuous Infusions: . sodium chloride    . sodium chloride 1 mL/kg/hr (08/10/18 0644)  . heparin 1,100 Units/hr (08/10/18 0643)   PRN Meds: sodium chloride, acetaminophen, nitroGLYCERIN, ondansetron (ZOFRAN) IV, sodium chloride flush   Vital Signs    Vitals:   08/09/18 2300 08/10/18 0107 08/10/18 0414 08/10/18 0815  BP: (!) 159/91 (!) 152/81 135/81 134/81  Pulse: 68 65 64 68  Resp: 16 16 20 18   Temp:  97.7 F (36.5 C) (!) 97.4 F (36.3 C) 98.6 F (37 C)  TempSrc:  Oral Oral Oral  SpO2: 98% 96% 96% 97%  Weight:  91.2 kg    Height:  5\' 9"  (1.753 m)      Intake/Output Summary (Last 24 hours) at 08/10/2018 0851 Last data filed at 08/10/2018 0800 Gross per 24 hour  Intake 1100.36 ml  Output 1125 ml  Net -24.64 ml   Filed Weights   08/09/18 1428 08/10/18 0107  Weight: 90.7 kg 91.2 kg    Telemetry    NSR - Personally Reviewed  ECG    NSR with TWI in leads aVL, V1 and V2- Personally Reviewed  Physical Exam   Today's Vitals   08/09/18 2344 08/10/18 0107 08/10/18 0414 08/10/18 0815  BP:  (!)  152/81 135/81 134/81  Pulse:  65 64 68  Resp:  16 20 18   Temp:  97.7 F (36.5 C) (!) 97.4 F (36.3 C) 98.6 F (37 C)  TempSrc:  Oral Oral Oral  SpO2:  96% 96% 97%  Weight:  91.2 kg    Height:  5\' 9"  (1.753 m)    PainSc: 0-No pain   0-No pain   Body mass index is 29.68 kg/m.  GEN: No acute distress.   Neck: No JVD Cardiac: RRR, no murmurs, rubs, or gallops.  Respiratory: Clear to auscultation bilaterally. GI: Soft, nontender, non-distended  MS: No edema; No deformity. Neuro:  Nonfocal  Psych: Normal affect   Labs    Chemistry Recent Labs  Lab 08/09/18 1355  NA 137  K 4.2  CL 107  CO2 23  GLUCOSE 105*  BUN 9  CREATININE 0.90  CALCIUM 9.1  GFRNONAA >60  GFRAA >60  ANIONGAP 7    Hematology Recent Labs  Lab 08/09/18 1355 08/10/18 0403  WBC 8.7 8.9  RBC 5.10 4.81  HGB 15.2 14.0  HCT 47.5 44.2  MCV 93.1 91.9  MCH 29.8 29.1  MCHC 32.0 31.7  RDW 13.3 13.4  PLT 254 235   Cardiac Enzymes Recent Labs  Lab 08/09/18 1749 08/09/18 2328 08/10/18 0403  TROPONINI 0.90* 3.32*  4.17*    Recent Labs  Lab 08/09/18 1401  TROPIPOC 0.22*    BNPNo results for input(s): BNP, PROBNP in the last 168 hours.   DDimer No results for input(s): DDIMER in the last 168 hours.   Radiology    Dg Chest 2 View  Result Date: 08/09/2018 CLINICAL DATA:  Chest pain and shortness of breath. EXAM: CHEST - 2 VIEW COMPARISON:  None available for comparison at time of study interpretation. FINDINGS: Cardiomediastinal silhouette is normal. Coronary artery calcification and/or stent. Bandlike densities LEFT lung base. No pleural effusion or focal consolidation. Mild hyperinflation. No pneumothorax. Soft tissue planes and included osseous structures are non suspicious. IMPRESSION: 1. Mild hyperinflation.  LEFT lung base atelectasis/scarring. Electronically Signed   By: Elon Alas M.D.   On: 08/09/2018 15:16   Cardiac Studies   None  Patient Profile     Franklin Ward is a  73 y.o. male with a hx of HTN who is being seen today for the evaluation of chest pain and elevated troponin.  Assessment & Plan    NSTE-ACS - No CP or SHOB since admission  - Troponin up to 4.17  - EKG with some new TWI in leads V1-V2 - Continue IV heparin  - Continue ASA, Metoprolol, and Atorvastatin  - Plan for cath today  - Echocardiogram after heart cath - F/u A1c   Hypertension  - Continue metoprolol  - Start ACE/ARB prior to discharge   HLD - LDL 139  - Continue Atorvastatin   Will discuss the case further with Dr. Tamala Julian.  For questions or updates, please contact [place] Please consult [email or web address] for contact info under Cardiology/STEMI.   Signed, Ina Homes, MD  08/10/2018, 8:51 AM

## 2018-08-10 NOTE — Progress Notes (Signed)
Dr Roland Rack notified of patient;s troponin trending up. Now 4.17. Patient is not distress. Reports no chest pain. Will continue to monitor.

## 2018-08-10 NOTE — Progress Notes (Signed)
[facility]  PROGRESS NOTE  Franklin Ward OEV:[id number] DOB: Aug 03, 1945 DOA: 08/09/2018 PCP: Shirline Frees, MD   Brief HPI:   73 year old male with no significant medical problems came to hospital with chest pressure.  Patient had elevated troponin, which went up to 4.17.  Cardiology was consulted.  IV heparin was started.    Subjective   This morning patient denies any chest pain.  Troponin elevation up to 4.17.  He was started on metoprolol, atorvastatin, heparin.   Assessment/Plan:     1. Acute coronary syndrome/non-STEMI-patient is on IV heparin, metoprolol, atorvastatin, aspirin.  Cardiology was consulted and plan for likely cath today.  2. Hypertension-started on metoprolol.  3. Hyperlipidemia-continue atorvastatin    CBC: Recent Labs  Lab 08/09/18 1355 08/10/18 0403  WBC 8.7 8.9  HGB 15.2 14.0  HCT 47.5 44.2  MCV 93.1 91.9  PLT 254 829    Basic Metabolic Panel: Recent Labs  Lab 08/09/18 1355  NA 137  K 4.2  CL 107  CO2 23  GLUCOSE 105*  BUN 9  CREATININE 0.90  CALCIUM 9.1     DVT prophylaxis: Heparin  Code Status: Full code  Family Communication: No family at bedside  Disposition Plan: likely home when medically ready for discharge     Consultants:  Cardiology  Procedures:     Antibiotics:   Anti-infectives (From admission, onward)   None       Objective   Vitals:   08/10/18 0414 08/10/18 0815 08/10/18 0950 08/10/18 1136  BP: 135/81 134/81 135/78 138/74  Pulse: 64 68 68 65  Resp: 20 18  16   Temp: (!) 97.4 F (36.3 C) 98.6 F (37 C)  98.8 F (37.1 C)  TempSrc: Oral Oral  Oral  SpO2: 96% 97%  96%  Weight:      Height:        Intake/Output Summary (Last 24 hours) at 08/10/2018 1311 Last data filed at 08/10/2018 1100 Gross per 24 hour  Intake 1407.64 ml  Output 1425 ml  Net -17.36 ml   Filed Weights   08/09/18 1428 08/10/18 0107  Weight: 90.7 kg 91.2 kg     Physical Examination:    General:  Appears in no acute distress  Cardiovascular: S1-S2, regular, no murmur auscultated  Respiratory: Clear to auscultation bilaterally.  No wheezing or crackles.  Abdomen: Abdomen soft, nontender, no organomegaly  Extremities: No edema of the lower extremities  Neurologic: Alert, oriented x3, no focal deficit noted.     Data Reviewed: I have personally reviewed following labs and imaging studies   No results found for this or any previous visit (from the past 240 hour(s)).   Liver Function Tests: No results for input(s): AST, ALT, ALKPHOS, BILITOT, PROT, ALBUMIN in the last 168 hours. No results for input(s): LIPASE, AMYLASE in the last 168 hours. No results for input(s): AMMONIA in the last 168 hours.  Cardiac Enzymes: Recent Labs  Lab 08/09/18 1749 08/09/18 2328 08/10/18 0403  TROPONINI 0.90* 3.32* 4.17*   BNP (last 3 results) No results for input(s): BNP in the last 8760 hours.  ProBNP (last 3 results) No results for input(s): PROBNP in the last 8760 hours.    Studies: Dg Chest 2 View  Result Date: 08/09/2018 CLINICAL DATA:  Chest pain and shortness of breath. EXAM: CHEST - 2 VIEW COMPARISON:  None available for comparison at time of study interpretation. FINDINGS: Cardiomediastinal silhouette is normal. Coronary artery calcification and/or stent. Bandlike densities LEFT lung base. No pleural  effusion or focal consolidation. Mild hyperinflation. No pneumothorax. Soft tissue planes and included osseous structures are non suspicious. IMPRESSION: 1. Mild hyperinflation.  LEFT lung base atelectasis/scarring. Electronically Signed   By: Elon Alas M.D.   On: 08/09/2018 15:16    Scheduled Meds: . [START ON 08/11/2018] aspirin EC  81 mg Oral Daily  . atorvastatin  40 mg Oral QHS  . metoprolol tartrate  12.5 mg Oral BID  . sodium chloride flush  3 mL Intravenous Q12H    Admission status: Inpatient: Based on patients clinical presentation and evaluation of above  clinical data, I have made determination that patient meets Inpatient criteria at this time.  Time spent: 25 min  [facility] Pager [phone]. If 7PM-7AM, please contact night-coverage at [email or web address], Office  [phone]  password [email or web address]  08/10/2018, 1:11 PM  LOS: 1 day

## 2018-08-10 NOTE — Progress Notes (Signed)
[place] for heparin Indication: chest pain/ACS  Allergies  Allergen Reactions  . Tetracyclines & Related Rash    Patient Measurements: Height: 5\' 9"  (175.3 cm) Weight: 201 lb (91.2 kg) IBW/kg (Calculated) : 70.7 Heparin Dosing Weight: 89.1kg  Vital Signs: Temp: 98.6 F (37 C) (02/20 0815) Temp Source: Oral (02/20 0815) BP: 134/81 (02/20 0815) Pulse Rate: 68 (02/20 0815)  Labs: Recent Labs    08/09/18 1355 08/09/18 1749 08/09/18 2328 08/10/18 0403 08/10/18 0718  HGB 15.2  --   --  14.0  --   HCT 47.5  --   --  44.2  --   PLT 254  --   --  235  --   HEPARINUNFRC  --   --  0.56  --  0.59  CREATININE 0.90  --   --   --   --   TROPONINI  --  0.90* 3.32* 4.17*  --     Estimated Creatinine Clearance: 81.6 mL/min (by C-G formula based on SCr of 0.9 mg/dL).   Medical History: History reviewed. No pertinent past medical history.  Medications:  Infusions:  . sodium chloride    . sodium chloride 1 mL/kg/hr (08/10/18 0644)  . heparin 1,100 Units/hr (08/10/18 3646)    Assessment: 42 yom presented to the ED with chest tightness. Troponin elevated and now starting IV heparin. Baseline CBC is WNL. He is not on anticoagulation PTA.   Heparin is therapeutic, CBC stable. Cath planned for today.  Goal of Therapy:  Heparin level 0.3-0.7 units/ml Monitor platelets by anticoagulation protocol: Yes   Plan:  -Continue heparin 1100 units/h -Cath today  Arrie Senate, PharmD, BCPS Clinical Pharmacist [phone] Please check AMION for all [place] numbers 08/10/2018

## 2018-08-11 ENCOUNTER — Telehealth: Payer: Self-pay | Admitting: Cardiology

## 2018-08-11 ENCOUNTER — Inpatient Hospital Stay (HOSPITAL_COMMUNITY): Payer: Medicare Other

## 2018-08-11 DIAGNOSIS — Z955 Presence of coronary angioplasty implant and graft: Secondary | ICD-10-CM

## 2018-08-11 DIAGNOSIS — R079 Chest pain, unspecified: Secondary | ICD-10-CM

## 2018-08-11 LAB — BASIC METABOLIC PANEL
Anion gap: 6 (ref 5–15)
BUN: 10 mg/dL (ref 8–23)
CHLORIDE: 107 mmol/L (ref 98–111)
CO2: 24 mmol/L (ref 22–32)
Calcium: 8.5 mg/dL — ABNORMAL LOW (ref 8.9–10.3)
Creatinine, Ser: 0.72 mg/dL (ref 0.61–1.24)
GFR calc Af Amer: >60 mL/min (ref >60)
GFR calc non Af Amer: >60 mL/min (ref >60)
Glucose, Bld: 105 mg/dL — ABNORMAL HIGH (ref 70–99)
Potassium: 3.7 mmol/L (ref 3.5–5.1)
Sodium: 137 mmol/L (ref 135–145)

## 2018-08-11 LAB — ECHOCARDIOGRAM COMPLETE
Height: 69 in
Weight: 3171.1 oz

## 2018-08-11 LAB — HEMOGLOBIN A1C
Hgb A1c MFr Bld: 5.8 % — ABNORMAL HIGH (ref 4.8–5.6)
Mean Plasma Glucose: 120 mg/dL

## 2018-08-11 MED ORDER — ATORVASTATIN CALCIUM 80 MG PO TABS: 80.0000 mg | ORAL_TABLET | Freq: Every day | ORAL | Status: DC

## 2018-08-11 MED ORDER — ISOSORBIDE MONONITRATE ER 30 MG PO TB24: 30.0000 mg | ORAL_TABLET | Freq: Every day | ORAL | 3 refills | Status: DC

## 2018-08-11 MED ORDER — TICAGRELOR 90 MG PO TABS: 90.0000 mg | ORAL_TABLET | Freq: Two times a day (BID) | ORAL | 3 refills | Status: DC

## 2018-08-11 MED ORDER — PERFLUTREN LIPID MICROSPHERE
1.0000 mL | INTRAVENOUS | Status: DC | PRN
  Administered 2018-08-11: 11:00:00 2 mL via INTRAVENOUS
  Filled 2018-08-11: qty 10

## 2018-08-11 MED ORDER — ATORVASTATIN CALCIUM 80 MG PO TABS: 80.0000 mg | ORAL_TABLET | Freq: Every day | ORAL | 3 refills | Status: DC

## 2018-08-11 MED ORDER — NITROGLYCERIN 0.3 MG SL SUBL: 0.3000 mg | SUBLINGUAL_TABLET | SUBLINGUAL | 3 refills | Status: AC | PRN

## 2018-08-11 MED ORDER — METOPROLOL TARTRATE 50 MG PO TABS: 50.0000 mg | ORAL_TABLET | Freq: Two times a day (BID) | ORAL | 2 refills | Status: DC

## 2018-08-11 NOTE — Progress Notes (Signed)
CARDIAC REHAB PHASE I   PRE:  Rate/Rhythm: 84 SR    BP: sitting 132/74    SaO2:   MODE:  Ambulation: 500 ft   POST:  Rate/Rhythm: 93 SR    BP: sitting 132/65     SaO2:   Tolerated well, no c/o. Ed completed. Voices understanding regarding Brilinta and other meds. It sounds like pt chews tobacco either occasionally or regularly. He changed the subject each time it was brought up. Left materials and discussed the role of any nicotine use. Encouraged diet and ex. Will refer to [place], ACSM 08/11/2018 9:07 AM

## 2018-08-11 NOTE — Progress Notes (Signed)
  Echocardiogram 2D Echocardiogram has been performed.  Franklin Ward 08/11/2018, 11:18 AM

## 2018-08-11 NOTE — Care Management Note (Signed)
Case Management Note  Patient Details  Name: LAKENDRICK PARADIS MRN: [id number] Date of Birth: 07-23-1945  Subjective/Objective: Pt presented for Nstemi- post cath/pci. PTA from home with spouse. Plan to return home on Brilinta. Benefits Check completed and patient is aware of cost. 30 day free Brilinta card provided.                    Action/Plan: Patient will pick up medications at [facility]. No further needs from CM at this time.   Expected Discharge Date:  08/11/18               Expected Discharge Plan:  Home/Self Care  In-House Referral:  NA  Discharge planning Services  CM Consult  Post Acute Care Choice:  NA Choice offered to:  NA  DME Arranged:  N/A DME Agency:  NA  HH Arranged:  NA HH Agency:  NA  Status of Service:  Completed, signed off  If discussed at [place] of Stay Meetings, dates discussed:    Additional Comments:  Bethena Roys, RN 08/11/2018, 10:32 AM

## 2018-08-11 NOTE — Progress Notes (Addendum)
The patient has been seen in conjunction with Robbie Lis, MD. All aspects of care have been considered and discussed. The patient has been personally interviewed, examined, and all clinical data has been reviewed.   No chest discomfort upon ambulating this morning.  He has severe with two-vessel disease receiving 2 stents in the right coronary.  Diffuse disease in a small caliber LAD diagonal system was not treated and felt best managed with medical therapy (I agree).  Plan aggressive risk modification, dual antiplatelet therapy, and discharged today.  Will need cardiology transition of care follow-up in 1 week.   Progress Note  Patient Name: Franklin Ward Date of Encounter: 08/11/2018  Primary Cardiologist:Dr. Tamala Julian  Subjective   Feeling well. No chest pain, sob or palpitations.   Inpatient Medications    Scheduled Meds: . aspirin EC  81 mg Oral Daily  . atorvastatin  40 mg Oral QHS  . enoxaparin (LOVENOX) injection  40 mg Subcutaneous Q24H  . isosorbide mononitrate  30 mg Oral Daily  . metoprolol tartrate  50 mg Oral BID  . sodium chloride flush  3 mL Intravenous Q12H  . ticagrelor  90 mg Oral BID   Continuous Infusions: . sodium chloride     PRN Meds: sodium chloride, acetaminophen, nitroGLYCERIN, ondansetron (ZOFRAN) IV, sodium chloride flush   Vital Signs    Vitals:   08/10/18 1751 08/10/18 1936 08/11/18 0336 08/11/18 0341  BP: 140/76 135/68 112/70   Pulse: 76 81 64   Resp: 18 19 20    Temp: 97.9 F (36.6 C) 98.3 F (36.8 C) 98.2 F (36.8 C)   TempSrc: Oral Oral Oral   SpO2: 97% 95% 96%   Weight:    89.9 kg  Height:        Intake/Output Summary (Last 24 hours) at 08/11/2018 0747 Last data filed at 08/11/2018 0740 Gross per 24 hour  Intake 2115.7 ml  Output 3925 ml  Net -1809.3 ml   Last 3 Weights 08/11/2018 08/10/2018 08/09/2018  Weight (lbs) 198 lb 3.1 oz 201 lb 200 lb  Weight (kg) 89.9 kg 91.173 kg 90.719 kg      Telemetry    SR at 70s -  Personally Reviewed  ECG    SR at rate of 67 bpm - Personally Reviewed  Physical Exam   GEN: No acute distress.   Neck: No JVD Cardiac: RRR, no murmurs, rubs, or gallops. Right radial cath site without hematoma Respiratory: Clear to auscultation bilaterally. GI: Soft, nontender, non-distended  MS: No edema; No deformity. Neuro:  Nonfocal  Psych: Normal affect   Labs    Chemistry Recent Labs  Lab 08/09/18 1355 08/11/18 0615  NA 137 137  K 4.2 3.7  CL 107 107  CO2 23 24  GLUCOSE 105* 105*  BUN 9 10  CREATININE 0.90 0.72  CALCIUM 9.1 8.5*  GFRNONAA >60 >60  GFRAA >60 >60  ANIONGAP 7 6     Hematology Recent Labs  Lab 08/09/18 1355 08/10/18 0403  WBC 8.7 8.9  RBC 5.10 4.81  HGB 15.2 14.0  HCT 47.5 44.2  MCV 93.1 91.9  MCH 29.8 29.1  MCHC 32.0 31.7  RDW 13.3 13.4  PLT 254 235    Cardiac Enzymes Recent Labs  Lab 08/09/18 1749 08/09/18 2328 08/10/18 0403  TROPONINI 0.90* 3.32* 4.17*    Recent Labs  Lab 08/09/18 1401  TROPIPOC 0.22*     Radiology    Dg Chest 2 View  Result Date: 08/09/2018 CLINICAL DATA:  Chest pain and shortness of breath. EXAM: CHEST - 2 VIEW COMPARISON:  None available for comparison at time of study interpretation. FINDINGS: Cardiomediastinal silhouette is normal. Coronary artery calcification and/or stent. Bandlike densities LEFT lung base. No pleural effusion or focal consolidation. Mild hyperinflation. No pneumothorax. Soft tissue planes and included osseous structures are non suspicious. IMPRESSION: 1. Mild hyperinflation.  LEFT lung base atelectasis/scarring. Electronically Signed   By: Elon Alas M.D.   On: 08/09/2018 15:16    Cardiac Studies   CORONARY STENT INTERVENTION 08/11/2018  LEFT HEART CATH AND CORONARY ANGIOGRAPHY  Conclusion     Mid RCA lesion is 90% stenosed.  A drug-eluting stent was successfully placed using a STENT RESOLUTE ONYX 4.0X15.  Post intervention, there is a 0% residual  stenosis.  Mid RCA to Dist RCA lesion is 90% stenosed.  Post intervention, there is a 0% residual stenosis.  A drug-eluting stent was successfully placed using a STENT RESOLUTE ONYX 4.0X22.  Mid Cx lesion is 40% stenosed.  Mid LAD lesion is 90% stenosed.  Ost 1st Sept lesion is 60% stenosed.  The left ventricular systolic function is normal.  LV end diastolic pressure is moderately elevated.  The left ventricular ejection fraction is 55-65% by visual estimate.   1. 2 vessel obstructive CAD    - The LAD is a small caliber vessel and is really a dual LAD system with a Septal branch and a diagonal branch that bifurcates and then trifurcates further distally. There is a 90% lesion in the diagonal LAD just prior to the bifurcation. This is calcified and the vessel diameter is only about 2 mm.    - Tandem 90% lesions in the mid vessel of a very large RCA vessel  2. Good LV function with very mild inferior HK 3. Moderately elevated LVEDP 4. Successful PCI of the tandem lesions in the mid RCA with DES x 2.   Plan: DAPT for one year. I would treat the LAD disease medically. Due to the small vessel size and lesion at a branch point I think PCI options are limited and the small vessel size would also make them poor targets for CABG. Will increase Metoprolol. Add Imdur. On statin. Will give lasix x 1 IV for elevated EDP. Potential DC tomorrow if stable.     Pending echo  Patient Profile     Franklin Ward a 73 y.o.malewith a hx of HTNwho is being seen  for the evaluation of chest pain and elevated troponin.  Assessment & Plan    1. NSTEMI - Troponin peaked at 4.17. EKG with some new TWI in leads V1-V2. Treated with heparin.  - Cath as above.  S/p successful PCI of the tandem lesions in the mid RCA with DES x 2. Dual LAD system with a Septal branch (60%) and a diagonal branch (90%)>> treat medically given smaller caliber. Overall normal LVEF with mild inferior HF. Given lasix x 1  for moderately elevated LVEDP. Will get echo.  - Continue ASA, Brillinta, Imdur, metoprolol 27mb BID and statin.   2. HLD - 08/10/2018: Cholesterol 189; HDL 35; LDL Cholesterol 139; Triglycerides 73; VLDL 15  - Will increase Lipitor to 80mg  daily.  - Lipid panel and LFTS in 6 weeks.   3. HTN - BP now improved on current regimen. May consider adding ACE/ARB pending echo.   Ambulate. Likely discharge home later today post echo. Medication recommendations as above. Will schedule TOC appointment.   For questions or updates, please contact [facility] Please consult [email or web address] for contact info under   SignedLeanor Kail, PA  08/11/2018, 7:47 AM

## 2018-08-11 NOTE — Discharge Summary (Addendum)
Physician Discharge Summary  Franklin Ward:[id number] DOB: [birth year] DOA: 08/09/2018  PCP: Shirline Frees, MD  Admit date: 08/09/2018 Discharge date: 08/11/2018  Time spent: 25* minutes  Recommendations for Outpatient Follow-up:  1. Follow-up cardiology in 1 week   Discharge Diagnoses:  Principal Problem:   NSTEMI (non-ST elevated myocardial infarction) [facility]) Active Problems:   Benign essential HTN   Discharge Condition: Stable  Diet recommendation: Heart healthy diet  Filed Weights   08/09/18 1428 08/10/18 0107 08/11/18 0341  Weight: 90.7 kg 91.2 kg 89.9 kg    History of present illness:  73 year old Ward with no significant medical problems came to hospital with chest pressure.  Patient had elevated troponin, which went up to 4.17.  Cardiology was consulted.  IV heparin was started.   Hospital Course:   Non-STEMI/ACS-patient presented with chest pain, troponin peaked up to 4.17.  Cardiology was consulted.  Patient underwent cardiac catheterization and 2 stent placement in right coronary artery.  Also had plaques in left anterior descending which was not amenable to PCI.  Medical management advised.  Patient is currently on aspirin, Brilinta, Imdur, metoprolol, atorvastatin.  These medications will be continued.  Patient to follow-up with cardiology in 1 week.   Hypertension-continue metoprolol  Hyperlipidemia-continue atorvastatin  Procedures:  Cardiac cath/PCI  Consultations:  Cardiology  Discharge Exam: Vitals:   08/11/18 0800 08/11/18 0830  BP: 132/74 132/65  Pulse:    Resp:    Temp:    SpO2:      General: Appears in no acute distress Cardiovascular: S1-S2, regular, no murmur auscultated Respiratory: Clear to auscultation bilaterally  Discharge Instructions   Discharge Instructions    Amb Referral to Cardiac Rehabilitation   Complete by:  As directed    Diagnosis:   Coronary Stents NSTEMI PTCA     Diet - low sodium heart healthy    Complete by:  As directed    Increase activity slowly   Complete by:  As directed      Allergies as of 08/11/2018      Reactions   Tetracyclines & Related Rash      Medication List    STOP taking these medications   OMEGA-3 FISH OIL PO   vitamin E 400 UNIT capsule     TAKE these medications   aspirin 81 MG tablet Take 81 mg by mouth daily.   atorvastatin 80 MG tablet Commonly known as:  LIPITOR Take 1 tablet (80 mg total) by mouth at bedtime.   B-12 2500 MCG Tabs Take 1 tablet by mouth daily.   CALCIUM 600 + D PO Take 1 tablet by mouth daily.   carboxymethylcellulose 0.5 % Soln Commonly known as:  REFRESH PLUS Place 1 drop into both eyes daily as needed (dry eyes).   cholecalciferol 25 MCG (1000 UT) tablet Commonly known as:  VITAMIN D3 Take 1,000 Units by mouth daily.   Garlic 2951 MG Caps Take 1,000 mg by mouth daily.   isosorbide mononitrate 30 MG 24 hr tablet Commonly known as:  IMDUR Take 1 tablet (30 mg total) by mouth daily.   METAMUCIL FIBER PO Take by mouth See admin instructions. Mix 1 teaspoonful of powder into a glass of water and drink two to three times a day   metoprolol tartrate 50 MG tablet Commonly known as:  LOPRESSOR Take 1 tablet (50 mg total) by mouth 2 (two) times daily.   multivitamin tablet Take 1 tablet by mouth daily.   nitroGLYCERIN 0.3 MG SL tablet Commonly  known as:  NITROSTAT Place 1 tablet (0.3 mg total) under the tongue every 5 (five) minutes as needed for chest pain.   OSTEO BI-FLEX ADV TRIPLE ST PO Take 1 tablet by mouth 2 (two) times daily.   saccharomyces boulardii 250 MG capsule Commonly known as:  FLORASTOR Take 250 mg by mouth 2 (two) times daily.   ticagrelor 90 MG Tabs tablet Commonly known as:  BRILINTA Take 1 tablet (90 mg total) by mouth 2 (two) times daily.   Turmeric 500 MG Caps Take 500 mg by mouth daily.   VITAMIN C PO Take 1,000 mg by mouth daily.      Allergies  Allergen Reactions  .  Tetracyclines & Related Rash   Follow-up Information    Franklin Serge, NP. Go on 08/21/2018.   Specialties:  Cardiology, Radiology Why:  @noon  for hospital follow up with Dr. Thompson Caul PA. please arrive 15 minutes early Contact information: [place] [address] [phone]            The results of significant diagnostics from this hospitalization (including imaging, microbiology, ancillary and laboratory) are listed below for reference.    Significant Diagnostic Studies: Dg Chest 2 View  Result Date: 08/09/2018 CLINICAL DATA:  Chest pain and shortness of breath. EXAM: CHEST - 2 VIEW COMPARISON:  None available for comparison at time of study interpretation. FINDINGS: Cardiomediastinal silhouette is normal. Coronary artery calcification and/or stent. Bandlike densities LEFT lung base. No pleural effusion or focal consolidation. Mild hyperinflation. No pneumothorax. Soft tissue planes and included osseous structures are non suspicious. IMPRESSION: 1. Mild hyperinflation.  LEFT lung base atelectasis/scarring. Electronically Signed   By: Elon Alas M.D.   On: 08/09/2018 15:16    Microbiology: No results found for this or any previous visit (from the past 240 hour(s)).   Labs: Basic Metabolic Panel: Recent Labs  Lab 08/09/18 1355 08/11/18 0615  NA 137 137  K 4.2 3.7  CL 107 107  CO2 23 24  GLUCOSE 105* 105*  BUN 9 10  CREATININE 0.90 0.Franklin  CALCIUM 9.1 8.5*   Liver Function Tests: No results for input(s): AST, ALT, ALKPHOS, BILITOT, PROT, ALBUMIN in the last 168 hours. No results for input(s): LIPASE, AMYLASE in the last 168 hours. No results for input(s): AMMONIA in the last 168 hours. CBC: Recent Labs  Lab 08/09/18 1355 08/10/18 0403  WBC 8.7 8.9  HGB 15.2 14.0  HCT 47.5 44.2  MCV 93.1 91.9  PLT 254 235   Cardiac Enzymes: Recent Labs  Lab 08/09/18 1749 08/09/18 2328 08/10/18 0403  TROPONINI 0.90* 3.32* 4.17*        Signed:  Oswald Hillock MD.  [facility] 08/11/2018, 11:40 AM

## 2018-08-11 NOTE — Care Management (Signed)
#    2   S/W  KIAMI   @ [employer]  #  [phone]   BRILINTA  90 MG BID COVER- YES CO-PAY- $ 47.00 TIER- 3 DRUG PRIOR APPROVAL- NO  PREFERRED PHARMACY : YES [facility]

## 2018-08-11 NOTE — Telephone Encounter (Signed)
° ° °  TOC appt requested by Bhagat   Appt made with [facility] 3/2

## 2018-08-11 NOTE — Telephone Encounter (Signed)
Pt will be discharged from hospital today.  Nursing to place TCM call on Monday 08/14/2018.

## 2018-08-14 ENCOUNTER — Telehealth (HOSPITAL_COMMUNITY): Payer: Self-pay

## 2018-08-14 NOTE — Telephone Encounter (Signed)
Stop isosorbide Hold atorvastatin Not sure any of the medications are causing diarrhea.  He may have caught a bug in the hospital.  Let see how he does holding the aforementioned medications.

## 2018-08-14 NOTE — Telephone Encounter (Signed)
Patient contacted regarding discharge from [facility] on 2/21.  Patient understands to follow up with provider Cecilie Kicks on 08/21/18 at 12:00 pm at [facility]. Patient understands discharge instructions? yes Patient understands medications and regiment? yes Patient understands to bring all medications to this visit? yes   I spoke to patient who said that Thursday through Sunday he was fine.  Monday morning at 12:00 am, he experienced a bout of diarrhea.  He had diarrhea at 2:40 am, 4:20 am and 5:10 this morning.  He was started on Atorvastatin, Metoprolol Tartrate, Imdur and Brilinta when leaving the hospital.  Please advise, thank you

## 2018-08-14 NOTE — Telephone Encounter (Signed)
Attempted to call patient in regards to Cardiac Rehab - LM on VM °Gloria W. Support Rep II °

## 2018-08-14 NOTE — Telephone Encounter (Signed)
Pt insurance is active and benefits verified through [place] $20.00, DED $0.00/0 met, out of pocket $3,900.00/0 met, co-insurance 0%. no pre-authorization required. Passport, 08/14/2018 @ 1:41pm , REF# [id number]  Will contact patient to see if he is interested in the Cardiac Rehab Program. If interested, patient will need to complete follow up appt. Once completed, patient will be contacted for scheduling upon review by the RN Navigator.

## 2018-08-14 NOTE — Telephone Encounter (Signed)
Spoke to patient with Dr Thompson Caul recommendation to Stop Imdur and hold Lipitor.  He verbalized understanding and would like a call to f/u from Dr Thompson Caul nurse, Anderson Malta on 2/25.

## 2018-08-16 NOTE — Telephone Encounter (Signed)
The patient called to inform us that his diarrhea has stopped and I reminded him that he has the hospital f/u with Cecilie Kicks on 3/2.  He verbalized understanding

## 2018-08-16 NOTE — Telephone Encounter (Signed)
Patient returning call.

## 2018-08-17 NOTE — Telephone Encounter (Signed)
Pt called and stated that he is not interested at this time.. Closed referral  Tedra Sen[place]. Support Rep II

## 2018-08-20 NOTE — Progress Notes (Signed)
Cardiology Office Note   Date:  08/21/2018   ID:  Franklin Ward, DOB [birth year], MRN [id number]  PCP:  Shirline Frees, MD  Cardiologist:  Dr. Tamala Julian    Chief Complaint  Patient presents with  . Hospitalization Follow-up    NSTEMI      History of Present Illness: Franklin Ward is a 73 y.o. male who presents for post hospitalization   He has hx of HTN.  Admitted 08/09/18 with intermittent substernal chest pressure, would last 15 min.   Started on IV heparin,  PCI of the tandem lesions in mRCA with DES X 2.  EF 55-65% he has LAD disease that is small caliber.    Since discharge lipitor held, and imdur held due to diarrhea.   Pt not interested in cardiac rehab. He is walking j2.5 miles per day at [facility], so it is inside.  I have asked him not to increase for another 2 weeks. He has a healthy appetite.  He monitors his diet.  His diarrhea has resolved.  He has no chest pain with his walking or any time.  No SOB.  No lightheadedness.  No palpitations. His family is with him today.    Past Medical History:  Diagnosis Date  . Coronary artery disease   . Diverticulitis     Past Surgical History:  Procedure Laterality Date  . CORONARY STENT INTERVENTION  08/10/2018  . CORONARY STENT INTERVENTION N/A 08/10/2018   Procedure: CORONARY STENT INTERVENTION;  Surgeon: Martinique, Peter M, MD;  Location: [facility];  Service: Cardiovascular;  Laterality: N/A;  . LEFT HEART CATH AND CORONARY ANGIOGRAPHY N/A 08/10/2018   Procedure: LEFT HEART CATH AND CORONARY ANGIOGRAPHY;  Surgeon: Martinique, Peter M, MD;  Location: [facility];  Service: Cardiovascular;  Laterality: N/A;     Current Outpatient Medications  Medication Sig Dispense Refill  . Ascorbic Acid (VITAMIN C PO) Take 1,000 mg by mouth daily.    Marland Kitchen aspirin 81 MG tablet Take 81 mg by mouth daily.    . Calcium Carb-Cholecalciferol (CALCIUM 600 + D PO) Take 1 tablet by mouth daily.     . carboxymethylcellulose (REFRESH  PLUS) 0.5 % SOLN Place 1 drop into both eyes daily as needed (dry eyes).    . cholecalciferol (VITAMIN D3) 25 MCG (1000 UT) tablet Take 1,000 Units by mouth daily.    . Cyanocobalamin (B-12) 2500 MCG TABS Take 1 tablet by mouth daily.     . Garlic 1941 MG CAPS Take 1,000 mg by mouth daily.     . metoprolol tartrate (LOPRESSOR) 50 MG tablet Take 1 tablet (50 mg total) by mouth 2 (two) times daily. 60 tablet 2  . Misc Natural Products (OSTEO BI-FLEX ADV TRIPLE ST PO) Take 1 tablet by mouth 2 (two) times daily.     . Multiple Vitamin (MULTIVITAMIN) tablet Take 1 tablet by mouth daily.    . nitroGLYCERIN (NITROSTAT) 0.3 MG SL tablet Place 1 tablet (0.3 mg total) under the tongue every 5 (five) minutes as needed for chest pain. 30 tablet 3  . Psyllium (METAMUCIL FIBER PO) Take by mouth See admin instructions. Mix 1 teaspoonful of powder into a glass of water and drink two to three times a day    . saccharomyces boulardii (FLORASTOR) 250 MG capsule Take 250 mg by mouth 2 (two) times daily.    . ticagrelor (BRILINTA) 90 MG TABS tablet Take 1 tablet (90 mg total) by mouth 2 (two) times daily.  60 tablet 3  . Turmeric 500 MG CAPS Take 500 mg by mouth daily.     Marland Kitchen atorvastatin (LIPITOR) 40 MG tablet Take 1 tablet (40 mg total) by mouth daily. 90 tablet 3   Current Facility-Administered Medications  Medication Dose Route Frequency Provider Last Rate Last Dose  . 0.9 %  sodium chloride infusion  500 mL Intravenous Continuous Irene Shipper, MD        Allergies:   Tetracyclines & related    Social History:  The patient  reports that he quit smoking about 38 years ago. He has quit using smokeless tobacco.  His smokeless tobacco use included chew. He reports that he does not drink alcohol or use drugs.   Family History:  The patient's family history includes Lung cancer in his father.    ROS:  General:no colds or fevers, no weight changes Skin:no rashes or ulcers HEENT:no blurred vision, no  congestion CV:see HPI PUL:see HPI GI:no longer with diarrhea no constipation or melena, no indigestion GU:no hematuria, no dysuria MS:no joint pain, no claudication Neuro:no syncope, no lightheadedness Endo:no diabetes, no thyroid disease  Wt Readings from Last 3 Encounters:  08/21/18 195 lb 12.8 oz (88.8 kg)  08/11/18 198 lb 3.1 oz (89.9 kg)  12/09/16 200 lb (90.7 kg)     PHYSICAL EXAM: VS:  BP 138/80   Pulse 65   Ht 5\' 9"  (1.753 m)   Wt 195 lb 12.8 oz (88.8 kg)   SpO2 98%   BMI 28.91 kg/m  , BMI Body mass index is 28.91 kg/m. General:Pleasant affect, NAD Skin:Warm and dry, brisk capillary refill HEENT:normocephalic, sclera clear, mucus membranes moist Neck:supple, no JVD, no bruits  Heart:S1S2 RRR without murmur, gallup, rub or click Lungs:clear without rales, rhonchi, or wheezes SHF:WYOV, non tender, + BS, do not palpate liver spleen or masses Ext:no lower ext edema, 2+ pedal pulses, 2+ radial pulses Neuro:alert and oriented X 3, MAE, follows commands, + facial symmetry    EKG:  EKG is ordered today. The ekg ordered today demonstrates SR inverted T waves in III, AVF stable with recent MI   Recent Labs: 08/10/2018: Hemoglobin 14.0; Platelets 235 08/11/2018: BUN 10; Creatinine, Ser 0.72; Potassium 3.7; Sodium 137    Lipid Panel    Component Value Date/Time   CHOL 189 08/10/2018 0403   TRIG 73 08/10/2018 0403   HDL 35 (L) 08/10/2018 0403   CHOLHDL 5.4 08/10/2018 0403   VLDL 15 08/10/2018 0403   LDLCALC 139 (H) 08/10/2018 0403       Other studies Reviewed: Additional studies/ records that were reviewed today include: . Cardiac cath 08/10/18  Mid RCA lesion is 90% stenosed.  A drug-eluting stent was successfully placed using a STENT RESOLUTE ONYX 4.0X15.  Post intervention, there is a 0% residual stenosis.  Mid RCA to Dist RCA lesion is 90% stenosed.  Post intervention, there is a 0% residual stenosis.  A drug-eluting stent was successfully placed  using a STENT RESOLUTE ONYX 4.0X22.  Mid Cx lesion is 40% stenosed.  Mid LAD lesion is 90% stenosed.  Ost 1st Sept lesion is 60% stenosed.  The left ventricular systolic function is normal.  LV end diastolic pressure is moderately elevated.  The left ventricular ejection fraction is 55-65% by visual estimate.   1. 2 vessel obstructive CAD    - The LAD is a small caliber vessel and is really a dual LAD system with a Septal branch and a diagonal branch that bifurcates and then  trifurcates further distally. There is a 90% lesion in the diagonal LAD just prior to the bifurcation. This is calcified and the vessel diameter is only about 2 mm.    - Tandem 90% lesions in the mid vessel of a very large RCA vessel  2. Good LV function with very mild inferior HK 3. Moderately elevated LVEDP 4. Successful PCI of the tandem lesions in the mid RCA with DES x 2.   Plan: DAPT for one year. I would treat the LAD disease medically. Due to the small vessel size and lesion at a branch point I think PCI options are limited and the small vessel size would also make them poor targets for CABG. Will increase Metoprolol. Add Imdur. On statin. Will give lasix x 1 IV for elevated EDP. Potential DC tomorrow if stable.   Echo IMPRESSIONS    1. The left ventricle has normal systolic function with an ejection fraction of 60-65%. The cavity size was normal. There is mildly increased left ventricular wall thickness. Left ventricular diastolic Doppler parameters are consistent with impaired  relaxation.  2. No definite regional wall motion abnormalites.  3. The right ventricle has normal systolic function. The cavity was normal. There is no increase in right ventricular wall thickness. Right ventricular systolic pressure could not be assessed.  4. The mitral valve is normal in structure.  5. The tricuspid valve is normal in structure.  6. The aortic valve is normal in structure. Aortic valve regurgitation is  trivial by color flow Doppler.  FINDINGS  Left Ventricle: The left ventricle has normal systolic function, with an ejection fraction of 60-65%. The cavity size was normal. There is mildly increased left ventricular wall thickness. Left ventricular diastolic Doppler parameters are consistent  with impaired relaxation Definity contrast agent was given IV to delineate the left ventricular endocardial borders. No definite regional wall motion abnormalites. Right Ventricle: The right ventricle has normal systolic function. The cavity was normal. There is no increase in right ventricular wall thickness. Right ventricular systolic pressure could not be assessed. Left Atrium: left atrial size was normal in size Right Atrium: right atrial size was normal in size. Right atrial pressure is estimated at 3 mmHg. Interatrial Septum: No atrial level shunt detected by color flow Doppler. A There is redundancy of the interatrial septum is seen. Pericardium: There is no evidence of pericardial effusion. Mitral Valve: The mitral valve is normal in structure. Mitral valve regurgitation is trivial by color flow Doppler. Tricuspid Valve: The tricuspid valve is normal in structure. Tricuspid valve regurgitation is trivial by color flow Doppler. Aortic Valve: The aortic valve is normal in structure. Aortic valve regurgitation is trivial by color flow Doppler. Pulmonic Valve: The pulmonic valve was normal in structure. Pulmonic valve regurgitation is trivial by color flow Doppler. Venous: The inferior vena cava is normal in size with greater than 50% respiratory variability.    ASSESSMENT AND PLAN:  1.  NSTEMI -no further chest pain  Continue ASA and Brilinta stent DES To tandem lesion in mrCA that is very large  2.  CAD residual to LAD that is a small caliber vessel and really a dual LAD system with a Septal branch and a diagonal branch that bifurcates and then trifurcates further distally. There is a 90% lesion in  the diagonal LAD just prior to the bifurcation. This is calcified and the vessel diameter is only about 2 mm.  Treating medically  Will resume imdur in 1 week after resuming lipitor.  3.  HLD had diarrhea with lipitor 80 will resume 40 mg of Lipitor if diarrhea returns then will change to Crestor.   Check hepatic and lipids in 6 weeks  Follow up with Dr. Tamala Julian in 8 weeks  4.  BP controlled.  Continue meds.   5.  Diarrhea, dehydration  may be from statin high dose reduced will check BMP today      Current medicines are reviewed with the patient today.  The patient Has no concerns regarding medicines.  The following changes have been made:  See above Labs/ tests ordered today include:see above  Disposition:   FU:  see above  Signed, Cecilie Kicks, NP  08/21/2018 [facility] [place], [address] [place], [place] Phone: [phone]; Fax: [phone]

## 2018-08-21 ENCOUNTER — Encounter: Payer: Self-pay | Admitting: Cardiology

## 2018-08-21 ENCOUNTER — Ambulatory Visit: Payer: Medicare Other | Admitting: Cardiology

## 2018-08-21 VITALS — BP 138/80 | HR 65 | Ht 69.0 in | Wt 195.8 lb

## 2018-08-21 DIAGNOSIS — I10 Essential (primary) hypertension: Secondary | ICD-10-CM | POA: Diagnosis not present

## 2018-08-21 DIAGNOSIS — I214 Non-ST elevation (NSTEMI) myocardial infarction: Secondary | ICD-10-CM | POA: Diagnosis not present

## 2018-08-21 DIAGNOSIS — R197 Diarrhea, unspecified: Secondary | ICD-10-CM

## 2018-08-21 DIAGNOSIS — E860 Dehydration: Secondary | ICD-10-CM | POA: Diagnosis not present

## 2018-08-21 DIAGNOSIS — I2510 Atherosclerotic heart disease of native coronary artery without angina pectoris: Secondary | ICD-10-CM

## 2018-08-21 DIAGNOSIS — E782 Mixed hyperlipidemia: Secondary | ICD-10-CM

## 2018-08-21 MED ORDER — ATORVASTATIN CALCIUM 40 MG PO TABS: 40.0000 mg | ORAL_TABLET | Freq: Every day | ORAL | 3 refills | Status: DC

## 2018-08-21 NOTE — Patient Instructions (Addendum)
Your physician has recommended you make the following change in your medication:  START ATORVASTATIN 40 MG EVERY DAY  IN 1 WEEK RESTART ISOSORBIDE 30 MG    Your physician recommends that you return for lab work in: [place] OF 4-13   Your physician recommends that you schedule a follow-up appointment in:  [place]

## 2018-08-22 LAB — BASIC METABOLIC PANEL
BUN/Creatinine Ratio: 17 (ref 10–24)
BUN: 13 mg/dL (ref 8–27)
CO2: 22 mmol/L (ref 20–29)
CREATININE: 0.77 mg/dL (ref 0.76–1.27)
Calcium: 9.9 mg/dL (ref 8.6–10.2)
Chloride: 101 mmol/L (ref 96–106)
GFR calc Af Amer: 104 mL/min/{1.73_m2} (ref >59)
GFR, EST NON AFRICAN AMERICAN: 90 mL/min/{1.73_m2} (ref >59)
Glucose: 96 mg/dL (ref 65–99)
Potassium: 5.2 mmol/L (ref 3.5–5.2)
Sodium: 138 mmol/L (ref 134–144)

## 2018-10-02 ENCOUNTER — Other Ambulatory Visit: Payer: Medicare Other | Admitting: *Deleted

## 2018-10-02 ENCOUNTER — Other Ambulatory Visit: Payer: Self-pay

## 2018-10-02 DIAGNOSIS — I214 Non-ST elevation (NSTEMI) myocardial infarction: Secondary | ICD-10-CM

## 2018-10-02 LAB — LIPID PANEL
Chol/HDL Ratio: 2.8 ratio (ref 0.0–5.0)
Cholesterol, Total: 94 mg/dL — ABNORMAL LOW (ref 100–199)
HDL: 33 mg/dL — ABNORMAL LOW (ref >39)
LDL Calculated: 42 mg/dL (ref 0–99)
Triglycerides: 93 mg/dL (ref 0–149)
VLDL Cholesterol Cal: 19 mg/dL (ref 5–40)

## 2018-10-02 LAB — HEPATIC FUNCTION PANEL
ALT: 37 IU/L (ref 0–44)
AST: 30 IU/L (ref 0–40)
Albumin: 4.5 g/dL (ref 3.7–4.7)
Alkaline Phosphatase: 49 IU/L (ref 39–117)
Bilirubin Total: 0.5 mg/dL (ref 0.0–1.2)
Bilirubin, Direct: 0.14 mg/dL (ref 0.00–0.40)
Total Protein: 7 g/dL (ref 6.0–8.5)

## 2018-11-09 ENCOUNTER — Other Ambulatory Visit: Payer: Self-pay | Admitting: Interventional Cardiology

## 2018-11-09 MED ORDER — METOPROLOL TARTRATE 50 MG PO TABS: 50.0000 mg | ORAL_TABLET | Freq: Two times a day (BID) | ORAL | 3 refills | Status: DC

## 2018-11-09 MED ORDER — ATORVASTATIN CALCIUM 40 MG PO TABS: 40.0000 mg | ORAL_TABLET | Freq: Every day | ORAL | 3 refills | Status: DC

## 2018-11-09 MED ORDER — TICAGRELOR 90 MG PO TABS: 90.0000 mg | ORAL_TABLET | Freq: Two times a day (BID) | ORAL | 3 refills | Status: DC

## 2018-11-21 ENCOUNTER — Telehealth: Payer: Self-pay | Admitting: Interventional Cardiology

## 2018-11-21 NOTE — Telephone Encounter (Signed)
Spoke with Franklin Ward about appt with Dr. Tamala Julian on 6/8.  Franklin Ward agreeable to phone visit, does not have a smart phone.  Went over consent.  Franklin Ward appreciative for call.     Virtual Visit Pre-Appointment Phone Call  "(Name), I am calling you today to discuss your upcoming appointment. We are currently trying to limit exposure to the virus that causes COVID-19 by seeing patients at home rather than in the office."  1. "What is the BEST phone number to call the day of the visit?" - include this in appointment notes  2. "Do you have or have access to (through a family member/friend) a smartphone with video capability that we can use for your visit?" a. If yes - list this number in appt notes as "cell" (if different from BEST phone #) and list the appointment type as a VIDEO visit in appointment notes b. If no - list the appointment type as a PHONE visit in appointment notes  3. Confirm consent - "In the setting of the current Covid19 crisis, you are scheduled for a (phone or video) visit with your provider on (date) at (time).  Just as we do with many in-office visits, in order for you to participate in this visit, we must obtain consent.  If you'd like, I can send this to your mychart (if signed up) or email for you to review.  Otherwise, I can obtain your verbal consent now.  All virtual visits are billed to your insurance company just like a normal visit would be.  By agreeing to a virtual visit, we'd like you to understand that the technology does not allow for your provider to perform an examination, and thus may limit your provider's ability to fully assess your condition. If your provider identifies any concerns that need to be evaluated in person, we will make arrangements to do so.  Finally, though the technology is pretty good, we cannot assure that it will always work on either your or our end, and in the setting of a video visit, we may have to convert it to a phone-only visit.  In either situation, we  cannot ensure that we have a secure connection.  Are you willing to proceed?" STAFF: Did the patient verbally acknowledge consent to telehealth visit? Document YES/NO here: yes  4. Advise patient to be prepared - "Two hours prior to your appointment, go ahead and check your blood pressure, pulse, oxygen saturation, and your weight (if you have the equipment to check those) and write them all down. When your visit starts, your provider will ask you for this information. If you have an Apple Watch or Kardia device, please plan to have heart rate information ready on the day of your appointment. Please have a pen and paper handy nearby the day of the visit as well."  5. Give patient instructions for MyChart download to smartphone OR [email or web address] as below if video visit (depending on what platform provider is using)  6. Inform patient they will receive a phone call 15 minutes prior to their appointment time (may be from unknown caller ID) so they should be prepared to answer    TELEPHONE CALL NOTE  Toan R Don has been deemed a candidate for a follow-up tele-health visit to limit community exposure during the Covid-19 pandemic. I spoke with the patient via phone to ensure availability of phone/video source, confirm preferred email & phone number, and discuss instructions and expectations.  I reminded Ruthvik R Bellanger to be prepared with  any vital sign and/or heart rhythm information that could potentially be obtained via home monitoring, at the time of his visit. I reminded Derron R Dasch to expect a phone call prior to his visit.  , L, LPN 12/21/7104 26:94 PM   INSTRUCTIONS FOR DOWNLOADING THE MYCHART APP TO SMARTPHONE  - The patient must first make sure to have activated MyChart and know their login information - If Apple, go to [employer] and type in MyChart in the search bar and download the app. If Android, ask patient to go to [employer] and type in [place] in the  search bar and download the app. The app is free but as with any other app downloads, their phone may require them to verify saved payment information or Apple/Android password.  - The patient will need to then log into the app with their MyChart username and password, and select [facility] as their healthcare provider to link the account. When it is time for your visit, go to the MyChart app, find appointments, and click Begin Video Visit. Be sure to Select Allow for your device to access the Microphone and Camera for your visit. You will then be connected, and your provider will be with you shortly.  **If they have any issues connecting, or need assistance please contact MyChart service desk [phone])**  **If using a computer, in order to ensure the best quality for their visit they will need to use either of the following Internet Browsers: [employer], or Google Chrome**  IF USING DOXIMITY or DOXY.ME - The patient will receive a link just prior to their visit by text.     FULL LENGTH CONSENT FOR TELE-HEALTH VISIT   I hereby voluntarily request, consent and authorize [facility] and its employed or contracted physicians, physician assistants, nurse practitioners or other licensed health care professionals (the Practitioner), to provide me with telemedicine health care services (the "Services") as deemed necessary by the treating Practitioner. I acknowledge and consent to receive the Services by the Practitioner via telemedicine. I understand that the telemedicine visit will involve communicating with the Practitioner through live audiovisual communication technology and the disclosure of certain medical information by electronic transmission. I acknowledge that I have been given the opportunity to request an in-person assessment or other available alternative prior to the telemedicine visit and am voluntarily participating in the telemedicine visit.  I understand that I  have the right to withhold or withdraw my consent to the use of telemedicine in the course of my care at any time, without affecting my right to future care or treatment, and that the Practitioner or I may terminate the telemedicine visit at any time. I understand that I have the right to inspect all information obtained and/or recorded in the course of the telemedicine visit and may receive copies of available information for a reasonable fee.  I understand that some of the potential risks of receiving the Services via telemedicine include:  Marland Kitchen Delay or interruption in medical evaluation due to technological equipment failure or disruption; . Information transmitted may not be sufficient (e.g. poor resolution of images) to allow for appropriate medical decision making by the Practitioner; and/or  . In rare instances, security protocols could fail, causing a breach of personal health information.  Furthermore, I acknowledge that it is my responsibility to provide information about my medical history, conditions and care that is complete and accurate to the best of my ability. I acknowledge that Practitioner's advice, recommendations, and/or  decision may be based on factors not within their control, such as incomplete or inaccurate data provided by me or distortions of diagnostic images or specimens that may result from electronic transmissions. I understand that the practice of medicine is not an exact science and that Practitioner makes no warranties or guarantees regarding treatment outcomes. I acknowledge that I will receive a copy of this consent concurrently upon execution via email to the email address I last provided but may also request a printed copy by calling the office of [facility].    I understand that my insurance will be billed for this visit.   I have read or had this consent read to me. . I understand the contents of this consent, which adequately explains the benefits and risks of the  Services being provided via telemedicine.  . I have been provided ample opportunity to ask questions regarding this consent and the Services and have had my questions answered to my satisfaction. . I give my informed consent for the services to be provided through the use of telemedicine in my medical care  By participating in this telemedicine visit I agree to the above.

## 2018-11-26 NOTE — Progress Notes (Signed)
Virtual Visit via Video Note   This visit type was conducted due to national recommendations for restrictions regarding the COVID-19 Pandemic (e.g. social distancing) in an effort to limit this patient's exposure and mitigate transmission in our community.  Due to his co-morbid illnesses, this patient is at least at moderate risk for complications without adequate follow up.  This format is felt to be most appropriate for this patient at this time.  All issues noted in this document were discussed and addressed.  A limited physical exam was performed with this format.  Please refer to the patient's chart for his consent to telehealth for [facility].   Date:  11/26/2018   ID:  Franklin Ward, DOB 25-Nov-1945, MRN [id number]  Patient Location: Home Provider Location: Home  PCP:  Shirline Frees, MD  Cardiologist:  Sinclair Grooms, MD  Electrophysiologist:  None   Evaluation Performed:  Follow-Up Visit  Chief Complaint:  CAD/NSTEMI  History of Present Illness:    Franklin Ward is a 73 y.o. male with CAD, Htn, hyperlipidemia, NSTEMI 08/10/18, and PCI RCA with DES and ongoing medical therapy for LAD residual disease unfavorable for PCI.  He is doing well.  He denies angina.  Diarrhea he had starting 1 day after discharge from the hospital in February resolved quickly and has not recurred.  He is on 40 mg of Lipitor down from 80 mg which was started initially.  Isosorbide had been placed on hold when the diarrhea recurred.  He is now back on that medication.  He has lost about 18 pounds.  He is walking 4 times per week.  He has not had angina.  He has residual LAD disease that will be difficult to treat with PCI.  Long discussion concerning secondary risk prevention.  He is in a high risk group and therefore my goal would be a lower LDL than the typical secondary prevention target of 70.  We will shoot for 55 mg/dL.  This was explained to the patient in detail who initially was  interested in decreasing intensity of statin therapy but upon understanding the importance, he is willing to continue 40 mg.  The patient does not have symptoms concerning for COVID-19 infection (fever, chills, cough, or new shortness of breath).    Past Medical History:  Diagnosis Date  . Coronary artery disease   . Diverticulitis    Past Surgical History:  Procedure Laterality Date  . CORONARY STENT INTERVENTION  08/10/2018  . CORONARY STENT INTERVENTION N/A 08/10/2018   Procedure: CORONARY STENT INTERVENTION;  Surgeon: Martinique, Peter M, MD;  Location: [place] [facility];  Service: Cardiovascular;  Laterality: N/A;  . LEFT HEART CATH AND CORONARY ANGIOGRAPHY N/A 08/10/2018   Procedure: LEFT HEART CATH AND CORONARY ANGIOGRAPHY;  Surgeon: Martinique, Peter M, MD;  Location: [place] [facility];  Service: Cardiovascular;  Laterality: N/A;     No outpatient medications have been marked as taking for the 11/27/18 encounter (Appointment) with Belva Crome, MD.   Current Facility-Administered Medications for the 11/27/18 encounter (Appointment) with Belva Crome, MD  Medication  . 0.9 %  sodium chloride infusion     Allergies:   Tetracyclines & related   Social History   Tobacco Use  . Smoking status: Former Smoker    Last attempt to quit: 06/21/1980    Years since quitting: 38.4  . Smokeless tobacco: Former Systems developer    Types: Chew  Substance Use Topics  . Alcohol use: No  .  Drug use: No     Family Hx: The patient's family history includes Lung cancer in his father. There is no history of Colon cancer.  ROS:   Please see the history of present illness.    He is concerned about his memory.  As mentioned before diarrhea has completely resolved.  He is concerned about medication side effects. All other systems reviewed and are negative.   Prior CV studies:   The following studies were reviewed today:  Coronary angiography February 2020 PCI: Intervention      Labs/Other Tests and  Data Reviewed:    EKG:  No ECG reviewed.  Recent Labs: 08/10/2018: Hemoglobin 14.0; Platelets 235 08/21/2018: BUN 13; Creatinine, Ser 0.77; Potassium 5.2; Sodium 138 10/02/2018: ALT 37   Recent Lipid Panel Lab Results  Component Value Date/Time   CHOL 94 (L) 10/02/2018 07:49 AM   TRIG 93 10/02/2018 07:49 AM   HDL 33 (L) 10/02/2018 07:49 AM   CHOLHDL 2.8 10/02/2018 07:49 AM   CHOLHDL 5.4 08/10/2018 04:03 AM   LDLCALC 42 10/02/2018 07:49 AM    Wt Readings from Last 3 Encounters:  08/21/18 195 lb 12.8 oz (88.8 kg)  08/11/18 198 lb 3.1 oz (89.9 kg)  12/09/16 200 lb (90.7 kg)     Objective:    Vital Signs:  There were no vitals taken for this visit.   VITAL SIGNS:  reviewed  ASSESSMENT & PLAN:    1. NSTEMI (non-ST elevated myocardial infarction) ([place])   2. Coronary artery disease involving native coronary artery of native heart without angina pectoris   3. Mixed hyperlipidemia   4. Benign essential HTN   5. Educated About Covid-19 Virus Infection    PLAN:  1. No recurrent angina since PCI.  No particular side effects on medications which include metoprolol, Brilinta, aspirin, and atorvastatin. 2. Secondary prevention and the gradient of protection between an LDL of 70 and 55 was discussed in detail.  We will continue high intensity statin therapy. 3. As noted above. 4. Target 130/80 mmHg.  Overall education and awareness concerning primary/secondary risk prevention was discussed in detail: LDL less than 70, hemoglobin A1c less than 7, blood pressure target less than 130/80 mmHg, >150 minutes of moderate aerobic activity per week, avoidance of smoking, weight control (via diet and exercise), and continued surveillance/management of/for obstructive sleep apnea.   COVID-19 Education: The signs and symptoms of COVID-19 were discussed with the patient and how to seek care for testing (follow up with PCP or arrange E-visit).  The importance of social distancing was discussed  today.  Time:   Today, I have spent 18 minutes with the patient with telehealth technology discussing the above problems.     Medication Adjustments/Labs and Tests Ordered: Current medicines are reviewed at length with the patient today.  Concerns regarding medicines are outlined above.   Tests Ordered: No orders of the defined types were placed in this encounter.   Medication Changes: No orders of the defined types were placed in this encounter.   Disposition:  Follow up in 3 month(s)  Signed, Sinclair Grooms, MD  11/26/2018 8:41 AM    [place]

## 2018-11-27 ENCOUNTER — Telehealth (INDEPENDENT_AMBULATORY_CARE_PROVIDER_SITE_OTHER): Payer: Medicare Other | Admitting: Interventional Cardiology

## 2018-11-27 ENCOUNTER — Other Ambulatory Visit: Payer: Self-pay

## 2018-11-27 ENCOUNTER — Encounter: Payer: Self-pay | Admitting: Interventional Cardiology

## 2018-11-27 VITALS — BP 130/74 | HR 60 | Ht 69.0 in | Wt 179.4 lb

## 2018-11-27 DIAGNOSIS — I10 Essential (primary) hypertension: Secondary | ICD-10-CM

## 2018-11-27 DIAGNOSIS — E782 Mixed hyperlipidemia: Secondary | ICD-10-CM

## 2018-11-27 DIAGNOSIS — Z7189 Other specified counseling: Secondary | ICD-10-CM

## 2018-11-27 DIAGNOSIS — I214 Non-ST elevation (NSTEMI) myocardial infarction: Secondary | ICD-10-CM

## 2018-11-27 DIAGNOSIS — I2510 Atherosclerotic heart disease of native coronary artery without angina pectoris: Secondary | ICD-10-CM

## 2018-11-27 MED ORDER — CLOPIDOGREL BISULFATE 75 MG PO TABS: 75.0000 mg | ORAL_TABLET | Freq: Every day | ORAL | 3 refills | Status: DC

## 2018-11-27 NOTE — Patient Instructions (Signed)
Medication Instructions:  1) DISCONTINUE Isosorbide (Imdur) 2) DISCONTINUE Brilinta 3) START Plavix- take 4 tablets along with your last dose of Brilinta, then take one tablet daily  If you need a refill on your cardiac medications before your next appointment, please call your pharmacy.   Lab work: You will need to have fasting labs at the time of your next appointment.  Nothing to eat or drink after midnight except water and black coffee)  If you have labs (blood work) drawn today and your tests are completely normal, you will receive your results only by: Marland Kitchen MyChart Message (if you have MyChart) OR . A paper copy in the mail If you have any lab test that is abnormal or we need to change your treatment, we will call you to review the results.  Testing/Procedures: None  Follow-Up: At [facility], you and your health needs are our priority.  As part of our continuing mission to provide you with exceptional heart care, we have created designated Provider Care Teams.  These Care Teams include your primary Cardiologist (physician) and Advanced Practice Providers (APPs -  Physician Assistants and Nurse Practitioners) who all work together to provide you with the care you need, when you need it. You will need a follow up appointment in 2-3 months.  Please call our office 2 months in advance to schedule this appointment.  You may see Sinclair Grooms, MD or one of the following Advanced Practice Providers on your designated Care Team:   Truitt Merle, NP Cecilie Kicks, NP . Kathyrn Drown, NP  Any Other Special Instructions Will Be Listed Below (If Applicable).

## 2019-02-23 ENCOUNTER — Telehealth: Payer: Self-pay | Admitting: Interventional Cardiology

## 2019-02-23 NOTE — Telephone Encounter (Signed)
Patient wanted to know if his wife would be allowed to come into his appointment with him.

## 2019-02-27 NOTE — Progress Notes (Signed)
Cardiology Office Note:    Date:  02/28/2019   ID:  Franklin Ward, DOB [birth year], MRN [id number]  PCP:  Shirline Frees, MD  Cardiologist:  Sinclair Grooms, MD   Referring MD: Shirline Frees, MD   Chief Complaint  Patient presents with  . Coronary Artery Disease    History of Present Illness:    Franklin Ward is a 73 y.o. male with a hx of  CAD, Htn, hyperlipidemia, NSTEMI 08/10/18, and PCI RCA with DES and ongoing medical therapy for LAD residual disease unfavorable for PCI.  He is doing well.  No symptoms of angina.  He has noted aching in his knees and thighs.  States that they feel sore.  He has not had chest pain, shortness of breath, palpitations, or syncope.  He wonders if atorvastatin is causing the discomfort in his legs.  Past Medical History:  Diagnosis Date  . Coronary artery disease   . Diverticulitis     Past Surgical History:  Procedure Laterality Date  . CORONARY STENT INTERVENTION  08/10/2018  . CORONARY STENT INTERVENTION N/A 08/10/2018   Procedure: CORONARY STENT INTERVENTION;  Surgeon: Martinique, Peter M, MD;  Location: [place] [facility];  Service: Cardiovascular;  Laterality: N/A;  . LEFT HEART CATH AND CORONARY ANGIOGRAPHY N/A 08/10/2018   Procedure: LEFT HEART CATH AND CORONARY ANGIOGRAPHY;  Surgeon: Martinique, Peter M, MD;  Location: [place] [facility];  Service: Cardiovascular;  Laterality: N/A;    Current Medications: Current Meds  Medication Sig  . Ascorbic Acid (VITAMIN C PO) Take 1,000 mg by mouth daily.  Marland Kitchen aspirin 81 MG tablet Take 81 mg by mouth daily.  Marland Kitchen atorvastatin (LIPITOR) 40 MG tablet Take 1 tablet (40 mg total) by mouth daily.  . Calcium Carb-Cholecalciferol (CALCIUM 600 + D PO) Take 1 tablet by mouth daily.   . carboxymethylcellulose (REFRESH PLUS) 0.5 % SOLN Place 1 drop into both eyes daily as needed (dry eyes).  . cholecalciferol (VITAMIN D3) 25 MCG (1000 UT) tablet Take 1,000 Units by mouth daily.  . clopidogrel (PLAVIX) 75 MG  tablet Take 1 tablet (75 mg total) by mouth daily.  . Cyanocobalamin (B-12) 2500 MCG TABS Take 1 tablet by mouth daily.   . Garlic 123XX123 MG CAPS Take 1,000 mg by mouth daily.   . metoprolol tartrate (LOPRESSOR) 50 MG tablet Take 1 tablet (50 mg total) by mouth 2 (two) times daily.  . Misc Natural Products (OSTEO BI-FLEX ADV TRIPLE ST PO) Take 1 tablet by mouth 2 (two) times daily.   . Multiple Vitamin (MULTIVITAMIN) tablet Take 1 tablet by mouth daily.  . nitroGLYCERIN (NITROSTAT) 0.3 MG SL tablet Place 1 tablet (0.3 mg total) under the tongue every 5 (five) minutes as needed for chest pain.  Marland Kitchen Psyllium (METAMUCIL FIBER PO) Take by mouth See admin instructions. Mix 1 teaspoonful of powder into a glass of water and drink two to three times a day  . saccharomyces boulardii (FLORASTOR) 250 MG capsule Take 250 mg by mouth 2 (two) times daily.  . Turmeric 500 MG CAPS Take 500 mg by mouth daily.    Current Facility-Administered Medications for the 02/28/19 encounter (Office Visit) with Belva Crome, MD  Medication  . 0.9 %  sodium chloride infusion     Allergies:   Tetracyclines & related   Social History   Socioeconomic History  . Marital status: Single    Spouse name: Not on file  . Number of children: Not on  file  . Years of education: Not on file  . Highest education level: Not on file  Occupational History  . Not on file  Social Needs  . Financial resource strain: Not on file  . Food insecurity    Worry: Not on file    Inability: Not on file  . Transportation needs    Medical: Not on file    Non-medical: Not on file  Tobacco Use  . Smoking status: Former Smoker    Quit date: 06/21/1980    Years since quitting: 38.7  . Smokeless tobacco: Former Systems developer    Types: Chew  Substance and Sexual Activity  . Alcohol use: No  . Drug use: No  . Sexual activity: Not on file  Lifestyle  . Physical activity    Days per week: Not on file    Minutes per session: Not on file  . Stress: Not  on file  Relationships  . Social Herbalist on phone: Not on file    Gets together: Not on file    Attends religious service: Not on file    Active member of club or organization: Not on file    Attends meetings of clubs or organizations: Not on file    Relationship status: Not on file  Other Topics Concern  . Not on file  Social History Narrative  . Not on file     Family History: The patient's family history includes Lung cancer in his father. There is no history of Colon cancer.  ROS:   Please see the history of present illness.    Other than the leg discomfort as mentioned above, there is no other complaint.  All other systems reviewed and are negative.  EKGs/Labs/Other Studies Reviewed:    The following studies were reviewed today: 2D Doppler echocardiogram February 2020: Diagnostic Dominance: Right  Intervention      EKG:  EKG not repeated  Recent Labs: 08/10/2018: Hemoglobin 14.0; Platelets 235 08/21/2018: BUN 13; Creatinine, Ser 0.77; Potassium 5.2; Sodium 138 10/02/2018: ALT 37  Recent Lipid Panel    Component Value Date/Time   CHOL 94 (L) 10/02/2018 0749   TRIG 93 10/02/2018 0749   HDL 33 (L) 10/02/2018 0749   CHOLHDL 2.8 10/02/2018 0749   CHOLHDL 5.4 08/10/2018 0403   VLDL 15 08/10/2018 0403   LDLCALC 42 10/02/2018 0749    Physical Exam:    VS:  BP 132/78   Pulse 62   Ht 5\' 9"  (1.753 m)   Wt 188 lb 3.2 oz (85.4 kg)   SpO2 98%   BMI 27.79 kg/m     Wt Readings from Last 3 Encounters:  02/28/19 188 lb 3.2 oz (85.4 kg)  11/27/18 179 lb 6.4 oz (81.4 kg)  08/21/18 195 lb 12.8 oz (88.8 kg)     GEN: Appears healthy but is masked.. No acute distress HEENT: Normal NECK: No JVD. LYMPHATICS: No lymphadenopathy CARDIAC:  RRR without murmur, gallop, or edema. VASCULAR:  Normal Pulses. No bruits. RESPIRATORY:  Clear to auscultation without rales, wheezing or rhonchi  ABDOMEN: Soft, non-tender, non-distended, No pulsatile mass,  MUSCULOSKELETAL: No deformity  SKIN: Warm and dry NEUROLOGIC:  Alert and oriented x 3 PSYCHIATRIC:  Normal affect   ASSESSMENT:    1. Coronary artery disease involving native coronary artery of native heart without angina pectoris   2. Mixed hyperlipidemia   3. Benign essential HTN   4. Educated About Covid-19 Virus Infection    PLAN:  In order of problems listed above:  1. Secondary prevention is discussed.  He is getting at least 150 minutes of moderate activity per week. 2. LDL needs to be maintained less than 70.  Probably having some statin effect with musculoskeletal pain.  Will discontinue atorvastatin 40 mg for 2 weeks and then resume at 20 mg/day.  He will monitor on and off of atorvastatin to determine if the leg discomfort has any relationship to statin intensity/statin therapy.  A lipid panel will be done in October. 3. Target 130/80 mmHg or less 4. Social distancing, face masking, and handwashing is encouraged.  Overall education and awareness concerning primary/secondary risk prevention was discussed in detail: LDL less than 70, hemoglobin A1c less than 7, blood pressure target less than 130/80 mmHg, >150 minutes of moderate aerobic activity per week, avoidance of smoking, weight control (via diet and exercise), and continued surveillance/management of/for obstructive sleep apnea.    Medication Adjustments/Labs and Tests Ordered: Current medicines are reviewed at length with the patient today.  Concerns regarding medicines are outlined above.  No orders of the defined types were placed in this encounter.  No orders of the defined types were placed in this encounter.   There are no Patient Instructions on file for this visit.   Signed, Sinclair Grooms, MD  02/28/2019 10:56 AM    [place]

## 2019-02-27 NOTE — Telephone Encounter (Signed)
Spoke with pt and he has no memory issues, is not HOH and no mobility issues.  Advised he would need to come up alone as we are limiting who comes up.  Pt verbalized understanding.

## 2019-02-28 ENCOUNTER — Encounter: Payer: Self-pay | Admitting: Interventional Cardiology

## 2019-02-28 ENCOUNTER — Other Ambulatory Visit: Payer: Self-pay

## 2019-02-28 ENCOUNTER — Encounter (INDEPENDENT_AMBULATORY_CARE_PROVIDER_SITE_OTHER): Payer: Self-pay

## 2019-02-28 ENCOUNTER — Ambulatory Visit (INDEPENDENT_AMBULATORY_CARE_PROVIDER_SITE_OTHER): Payer: Medicare Other | Admitting: Interventional Cardiology

## 2019-02-28 VITALS — BP 132/78 | HR 62 | Ht 69.0 in | Wt 188.2 lb

## 2019-02-28 DIAGNOSIS — I2510 Atherosclerotic heart disease of native coronary artery without angina pectoris: Secondary | ICD-10-CM | POA: Diagnosis not present

## 2019-02-28 DIAGNOSIS — I10 Essential (primary) hypertension: Secondary | ICD-10-CM

## 2019-02-28 DIAGNOSIS — Z7189 Other specified counseling: Secondary | ICD-10-CM

## 2019-02-28 DIAGNOSIS — E782 Mixed hyperlipidemia: Secondary | ICD-10-CM

## 2019-02-28 MED ORDER — ATORVASTATIN CALCIUM 20 MG PO TABS: 20.0000 mg | ORAL_TABLET | Freq: Every day | ORAL | 3 refills | Status: DC

## 2019-02-28 NOTE — Patient Instructions (Signed)
Medication Instructions:  1) Discontinue Atorvastatin 40mg  once daily for 2 weeks and then start Atorvastatin 20mg  once daily.  Call the office and let us know how the aching and pains are.  If you need a refill on your cardiac medications before your next appointment, please call your pharmacy.   Lab work: Your physician recommends that you return for lab work in: mid to late October. (Liver, Lipid).  You will need to come fasting for these labs (nothing to eat or drink after midnight except water and black coffee).  If you have labs (blood work) drawn today and your tests are completely normal, you will receive your results only by: Marland Kitchen MyChart Message (if you have MyChart) OR . A paper copy in the mail If you have any lab test that is abnormal or we need to change your treatment, we will call you to review the results.  Testing/Procedures: None  Follow-Up: At [facility], you and your health needs are our priority.  As part of our continuing mission to provide you with exceptional heart care, we have created designated Provider Care Teams.  These Care Teams include your primary Cardiologist (physician) and Advanced Practice Providers (APPs -  Physician Assistants and Nurse Practitioners) who all work together to provide you with the care you need, when you need it. You will need a follow up appointment in 5 months.  Please call our office 2 months in advance to schedule this appointment.  You may see Sinclair Grooms, MD or one of the following Advanced Practice Providers on your designated Care Team:   Truitt Merle, NP Cecilie Kicks, NP . Kathyrn Drown, NP  Any Other Special Instructions Will Be Listed Below (If Applicable).

## 2019-03-14 ENCOUNTER — Telehealth: Payer: Self-pay | Admitting: Interventional Cardiology

## 2019-03-14 NOTE — Telephone Encounter (Signed)
Advised patient to call back in a week with follow-up regarding his new dose of Lipitor. Pt agreeable.

## 2019-03-14 NOTE — Telephone Encounter (Signed)
Okay, please give f/u about the 20 mg dose.

## 2019-03-14 NOTE — Telephone Encounter (Signed)
New Message:  Patient called and wanted to let Dr. Tamala Julian know that he has been off of his atorvastatin (LIPITOR) for the past two weeks. He says that the pains in his knees have gotten better, and that he will be starting the 20 mg dose tomorrow (09/24)

## 2019-03-22 ENCOUNTER — Telehealth: Payer: Self-pay | Admitting: Interventional Cardiology

## 2019-03-22 NOTE — Telephone Encounter (Signed)
Left message to call back  

## 2019-03-22 NOTE — Telephone Encounter (Signed)
Spoke with pt and he started the Atorvastatin 20mg  QD a week ago.  Not having any issues at this time with myalgias.  Pt was wondering if he needed to be on anything else like CoQ10?  Advised if Dr. Tamala Julian feels he needs anything else to help with his cholesterol or heart health I would call back, otherwise just continue same meds.  Pt verbalized understanding.

## 2019-03-22 NOTE — Telephone Encounter (Signed)
If doing okay, I do not believe anything further needs to be added.

## 2019-03-22 NOTE — Telephone Encounter (Signed)
New Message    Pt c/o medication issue:  1. Name of Medication: Atorvastatin 20mg   2. How are you currently taking this medication (dosage and times per day)? 1 tablet by mouth daily  3. Are you having a reaction (difficulty breathing--STAT)? No  4. What is your medication issue? Patient states Dr. Tamala Julian wanted to know how he's doing with the adjustment, and patient states he's doing fine with it.  But wants to know if he should be taking anything else along with the Atorvastatin to replenish whatever it wipes out.

## 2019-08-26 NOTE — Progress Notes (Signed)
Cardiology Office Note:    Date:  08/27/2019   ID:  Franklin Ward, DOB Oct 27, 1945, MRN [id number]  PCP:  Shirline Frees, MD  Cardiologist:  Sinclair Grooms, MD   Referring MD: Shirline Frees, MD   Chief Complaint  Patient presents with  . Coronary Artery Disease  . Hyperlipidemia    History of Present Illness:    Franklin Ward is a 74 y.o. male with a hx of CAD, Htn, hyperlipidemia, NSTEMI 08/10/18, and PCI RCA with DES and ongoing medical therapy for LAD residual disease unfavorable for PCI.  No cardiac complaints.  Not had palpitations or syncope.  Denies lower extremity swelling.  Has not been exercising.  He denies angina.  No medication side effects.  Past Medical History:  Diagnosis Date  . Coronary artery disease   . Diverticulitis     Past Surgical History:  Procedure Laterality Date  . CORONARY STENT INTERVENTION  08/10/2018  . CORONARY STENT INTERVENTION N/A 08/10/2018   Procedure: CORONARY STENT INTERVENTION;  Surgeon: Martinique, Peter M, MD;  Location: [place] [facility];  Service: Cardiovascular;  Laterality: N/A;  . LEFT HEART CATH AND CORONARY ANGIOGRAPHY N/A 08/10/2018   Procedure: LEFT HEART CATH AND CORONARY ANGIOGRAPHY;  Surgeon: Martinique, Peter M, MD;  Location: [place] [facility];  Service: Cardiovascular;  Laterality: N/A;    Current Medications: Current Meds  Medication Sig  . Ascorbic Acid (VITAMIN C PO) Take 1,000 mg by mouth daily.  Marland Kitchen aspirin 81 MG tablet Take 81 mg by mouth daily.  Marland Kitchen atorvastatin (LIPITOR) 20 MG tablet Take 1 tablet (20 mg total) by mouth daily.  . Calcium Carb-Cholecalciferol (CALCIUM 600 + D PO) Take 1 tablet by mouth daily.   . carboxymethylcellulose (REFRESH PLUS) 0.5 % SOLN Place 1 drop into both eyes daily as needed (dry eyes).  . cholecalciferol (VITAMIN D3) 25 MCG (1000 UT) tablet Take 5,000 Units by mouth daily.   . clopidogrel (PLAVIX) 75 MG tablet Take 1 tablet (75 mg total) by mouth daily.  . Cyanocobalamin  (B-12) 2500 MCG TABS Take 1 tablet by mouth daily.   . Garlic 123XX123 MG CAPS Take 1,000 mg by mouth daily.   . metoprolol tartrate (LOPRESSOR) 50 MG tablet Take 1 tablet (50 mg total) by mouth 2 (two) times daily.  . Misc Natural Products (OSTEO BI-FLEX ADV TRIPLE ST PO) Take 1 tablet by mouth 2 (two) times daily.   . Multiple Vitamin (MULTIVITAMIN) tablet Take 1 tablet by mouth daily.  . nitroGLYCERIN (NITROSTAT) 0.3 MG SL tablet Place 1 tablet (0.3 mg total) under the tongue every 5 (five) minutes as needed for chest pain.  Marland Kitchen Psyllium (METAMUCIL FIBER PO) Take by mouth See admin instructions. Mix 1 teaspoonful of powder into a glass of water and drink two to three times a day  . saccharomyces boulardii (FLORASTOR) 250 MG capsule Take 250 mg by mouth 2 (two) times daily.  . Turmeric 500 MG CAPS Take 500 mg by mouth daily.   Marland Kitchen zinc gluconate 50 MG tablet Take 50 mg by mouth daily.   Current Facility-Administered Medications for the 08/27/19 encounter (Office Visit) with Belva Crome, MD  Medication  . 0.9 %  sodium chloride infusion     Allergies:   Tetracyclines & related   Social History   Socioeconomic History  . Marital status: Single    Spouse name: Not on file  . Number of children: Not on file  . Years of education:  Not on file  . Highest education level: Not on file  Occupational History  . Not on file  Tobacco Use  . Smoking status: Former Smoker    Quit date: 06/21/1980    Years since quitting: 39.2  . Smokeless tobacco: Former Systems developer    Types: Chew  Substance and Sexual Activity  . Alcohol use: No  . Drug use: No  . Sexual activity: Not on file  Other Topics Concern  . Not on file  Social History Narrative  . Not on file   Social Determinants of Health   Financial Resource Strain:   . Difficulty of Paying Living Expenses: Not on file  Food Insecurity:   . Worried About Charity fundraiser in the Last Year: Not on file  . Ran Out of Food in the Last Year: Not on  file  Transportation Needs:   . Lack of Transportation (Medical): Not on file  . Lack of Transportation (Non-Medical): Not on file  Physical Activity:   . Days of Exercise per Week: Not on file  . Minutes of Exercise per Session: Not on file  Stress:   . Feeling of Stress : Not on file  Social Connections:   . Frequency of Communication with Friends and Family: Not on file  . Frequency of Social Gatherings with Friends and Family: Not on file  . Attends Religious Services: Not on file  . Active Member of Clubs or Organizations: Not on file  . Attends Archivist Meetings: Not on file  . Marital Status: Not on file     Family History: The patient's family history includes Lung cancer in his father. There is no history of Colon cancer.  ROS:   Please see the history of present illness.    Knees hurt from time to time.'s had a difficult time exercising because of the pandemic, the weather, and low back discomfort.  All other systems reviewed and are negative.  EKGs/Labs/Other Studies Reviewed:    The following studies were reviewed today: No new data  EKG:  EKG performed today demonstrates normal sinus rhythm, normal EKG.  When compared to his prior tracing performed 08/21/2018, no changes occurred.  Recent Labs: 10/02/2018: ALT 37  Recent Lipid Panel    Component Value Date/Time   CHOL 94 (L) 10/02/2018 0749   TRIG 93 10/02/2018 0749   HDL 33 (L) 10/02/2018 0749   CHOLHDL 2.8 10/02/2018 0749   CHOLHDL 5.4 08/10/2018 0403   VLDL 15 08/10/2018 0403   LDLCALC 42 10/02/2018 0749    Physical Exam:    VS:  BP 138/78   Pulse 66   Ht 5\' 9"  (1.753 m)   Wt 204 lb 9.6 oz (92.8 kg)   SpO2 97%   BMI 30.21 kg/m     Wt Readings from Last 3 Encounters:  08/27/19 204 lb 9.6 oz (92.8 kg)  02/28/19 188 lb 3.2 oz (85.4 kg)  11/27/18 179 lb 6.4 oz (81.4 kg)     GEN: Compatible with age. No acute distress HEENT: Normal NECK: No JVD. LYMPHATICS: No  lymphadenopathy CARDIAC:  RRR without murmur, gallop, or edema. VASCULAR:  Normal Pulses. No bruits. RESPIRATORY:  Clear to auscultation without rales, wheezing or rhonchi  ABDOMEN: Soft, non-tender, non-distended, No pulsatile mass, MUSCULOSKELETAL: No deformity  SKIN: Warm and dry NEUROLOGIC:  Alert and oriented x 3 PSYCHIATRIC:  Normal affect   ASSESSMENT:    1. Coronary artery disease involving native coronary artery of native heart without  angina pectoris   2. Mixed hyperlipidemia   3. Benign essential HTN   4. Educated about COVID-19 virus infection    PLAN:    In order of problems listed above:  1. Secondary prevention discussed.  He needs to achieve 150 minutes of moderate activity per week.  De-escalate antiplatelet therapy to clopidogrel 75 mg/day, dropping aspirin.  He is not willing to drop aspirin yet but I did explain to him the rationale.  We will likely do this next year.  In the meantime he will watch his stools and inform me of any blood loss. 2. Target LDL less than 70.  Continue Lipitor 20 mg/day.  Basic metabolic panel today. 3. Target blood pressure 130/80 mmHg.  The blood pressure is a little elevated this morning.  We must consider adding an ARB if his blood pressure remains greater than target after he resumes regular physical activity.  I would recommend losartan 25 or 50 mg/day at that point. 4. Pfizer vaccine has started.  He has received 1 dose.  3W's is emphasized.   Overall education and awareness concerning primary/secondary risk prevention was discussed in detail: LDL less than 70, hemoglobin A1c less than 7, blood pressure target less than 130/80 mmHg, >150 minutes of moderate aerobic activity per week, avoidance of smoking, weight control (via diet and exercise), and continued surveillance/management of/for obstructive sleep apnea.   Medication Adjustments/Labs and Tests Ordered: Current medicines are reviewed at length with the patient today.   Concerns regarding medicines are outlined above.  Orders Placed This Encounter  Procedures  . Lipid panel  . Hepatic function panel  . EKG 12-Lead   No orders of the defined types were placed in this encounter.   Patient Instructions  Medication Instructions:  Your physician recommends that you continue on your current medications as directed. Please refer to the Current Medication list given to you today.  *If you need a refill on your cardiac medications before your next appointment, please call your pharmacy*   Lab Work: Liver and Lipid today  If you have labs (blood work) drawn today and your tests are completely normal, you will receive your results only by: Marland Kitchen MyChart Message (if you have MyChart) OR . A paper copy in the mail If you have any lab test that is abnormal or we need to change your treatment, we will call you to review the results.   Testing/Procedures: None   Follow-Up: At [facility], you and your health needs are our priority.  As part of our continuing mission to provide you with exceptional heart care, we have created designated Provider Care Teams.  These Care Teams include your primary Cardiologist (physician) and Advanced Practice Providers (APPs -  Physician Assistants and Nurse Practitioners) who all work together to provide you with the care you need, when you need it.  We recommend signing up for the patient portal called "MyChart".  Sign up information is provided on this After Visit Summary.  MyChart is used to connect with patients for Virtual Visits (Telemedicine).  Patients are able to view lab/test results, encounter notes, upcoming appointments, etc.  Non-urgent messages can be sent to your provider as well.   To learn more about what you can do with MyChart, go to [email or web address].    Your next appointment:   1 year(s)  The format for your next appointment:   In Person  Provider:   You may see Sinclair Grooms, MD or one of  the following  Advanced Practice Providers on your designated Care Team:    Truitt Merle, NP  Cecilie Kicks, NP  Kathyrn Drown, NP    Other Instructions  Your provider recommends that you maintain 150 minutes per week of moderate aerobic activity.      Signed, Sinclair Grooms, MD  08/27/2019 9:16 AM    [place]

## 2019-08-27 ENCOUNTER — Ambulatory Visit: Payer: Medicare Other | Admitting: Interventional Cardiology

## 2019-08-27 ENCOUNTER — Other Ambulatory Visit: Payer: Self-pay

## 2019-08-27 ENCOUNTER — Encounter: Payer: Self-pay | Admitting: Interventional Cardiology

## 2019-08-27 VITALS — BP 138/78 | HR 66 | Ht 69.0 in | Wt 204.6 lb

## 2019-08-27 DIAGNOSIS — I2510 Atherosclerotic heart disease of native coronary artery without angina pectoris: Secondary | ICD-10-CM

## 2019-08-27 DIAGNOSIS — I10 Essential (primary) hypertension: Secondary | ICD-10-CM | POA: Diagnosis not present

## 2019-08-27 DIAGNOSIS — Z7189 Other specified counseling: Secondary | ICD-10-CM

## 2019-08-27 DIAGNOSIS — E782 Mixed hyperlipidemia: Secondary | ICD-10-CM

## 2019-08-27 LAB — HEPATIC FUNCTION PANEL
ALT: 28 IU/L (ref 0–44)
AST: 25 IU/L (ref 0–40)
Albumin: 4.3 g/dL (ref 3.7–4.7)
Alkaline Phosphatase: 49 IU/L (ref 39–117)
Bilirubin Total: 0.4 mg/dL (ref 0.0–1.2)
Bilirubin, Direct: 0.13 mg/dL (ref 0.00–0.40)
Total Protein: 7.1 g/dL (ref 6.0–8.5)

## 2019-08-27 LAB — LIPID PANEL
Chol/HDL Ratio: 2.8 ratio (ref 0.0–5.0)
Cholesterol, Total: 115 mg/dL (ref 100–199)
HDL: 41 mg/dL (ref >39)
LDL Chol Calc (NIH): 57 mg/dL (ref 0–99)
Triglycerides: 86 mg/dL (ref 0–149)
VLDL Cholesterol Cal: 17 mg/dL (ref 5–40)

## 2019-08-27 NOTE — Patient Instructions (Signed)
Medication Instructions:  Your physician recommends that you continue on your current medications as directed. Please refer to the Current Medication list given to you today.  *If you need a refill on your cardiac medications before your next appointment, please call your pharmacy*   Lab Work: Liver and Lipid today  If you have labs (blood work) drawn today and your tests are completely normal, you will receive your results only by: Marland Kitchen MyChart Message (if you have MyChart) OR . A paper copy in the mail If you have any lab test that is abnormal or we need to change your treatment, we will call you to review the results.   Testing/Procedures: None   Follow-Up: At [facility], you and your health needs are our priority.  As part of our continuing mission to provide you with exceptional heart care, we have created designated Provider Care Teams.  These Care Teams include your primary Cardiologist (physician) and Advanced Practice Providers (APPs -  Physician Assistants and Nurse Practitioners) who all work together to provide you with the care you need, when you need it.  We recommend signing up for the patient portal called "MyChart".  Sign up information is provided on this After Visit Summary.  MyChart is used to connect with patients for Virtual Visits (Telemedicine).  Patients are able to view lab/test results, encounter notes, upcoming appointments, etc.  Non-urgent messages can be sent to your provider as well.   To learn more about what you can do with MyChart, go to [email or web address].    Your next appointment:   1 year(s)  The format for your next appointment:   In Person  Provider:   You may see Sinclair Grooms, MD or one of the following Advanced Practice Providers on your designated Care Team:    Truitt Merle, NP  Cecilie Kicks, NP  Kathyrn Drown, NP    Other Instructions  Your provider recommends that you maintain 150 minutes per week of moderate aerobic  activity.

## 2019-08-29 ENCOUNTER — Other Ambulatory Visit: Payer: Self-pay | Admitting: Interventional Cardiology

## 2020-01-04 ENCOUNTER — Telehealth: Payer: Self-pay | Admitting: Interventional Cardiology

## 2020-01-04 NOTE — Telephone Encounter (Signed)
Spoke with pt and made him aware of information from out Pharmacist.  Pt verbalized understanding and was appreciative for call.

## 2020-01-04 NOTE — Telephone Encounter (Signed)
Any contraindications?

## 2020-01-04 NOTE — Telephone Encounter (Signed)
Ok to take vitamin for eyes. Side effects to look out for would be stomach upset or increase in bruising/bleeding, but neither should be common with low dose of Vitamin E that's in the supplement.

## 2020-01-04 NOTE — Telephone Encounter (Signed)
Pt c/o medication issue:  1. Name of Medication: PreserVision Eye Vitamins (90 MG of vitamin E per tablet)  2. How are you currently taking this medication (dosage and times per day)? Has not began taking medication yet   3. Are you having a reaction (difficulty breathing--STAT)? No   4. What is your medication issue? Franklin Ward is wanting to know if it is okay for him to begin taking one tablet of this medication daily. He states it has 90 MG's of vitamin E per tablet. Please advise.

## 2020-03-22 ENCOUNTER — Other Ambulatory Visit: Payer: Self-pay | Admitting: Interventional Cardiology

## 2020-04-03 ENCOUNTER — Telehealth: Payer: Self-pay | Admitting: *Deleted

## 2020-04-03 NOTE — Telephone Encounter (Signed)
Will be okay to hold plavix for knee procedure. Resume ASAP post procedure. Agree needs f/u reassessment before clearance. Thanks

## 2020-04-03 NOTE — Telephone Encounter (Signed)
I called to s/w pt to advise he will need a pre op appt. Pt's wife answered the phone. Pt's wife was extremely SOB, barley could get any words out and make complete sentences. Pt's wife was able to get across to me that she has asthma and was running to answer the phone. I asked the pt's wife if she had her inhaler by her side, she answered yes. I advised her to sit down and use her inhaler and that I was not hanging up until she could breathe easier. Pt's husband then got on the phone and I explained to him that I was originally calling to schedule him a pre op appt. I explained to him that I was talking to his wife and and that she is extremely SOB and that I advised for her to sit down and use her inhaler right now. I assured the pt that I was not going to hang up until I could hear his wife breathing easier. Pt thanked for the call for pre op appt and thanked me for helping his wife at the same time. I advised if her asthma does not get better she should go to the ED. Pt verbalized all instructions. Will forward clearance notes to MD for upcoming appt tomorrow. Will remove from the pre op call back pool.

## 2020-04-03 NOTE — Telephone Encounter (Signed)
Primary Cardiologist:Henry Nicholes Stairs III, MD  Chart reviewed as part of pre-operative protocol coverage. Because of Franklin Ward's past medical history and time since last visit, he/she will require a follow-up visit in order to better assess preoperative cardiovascular risk.  Pre-op covering staff: - Please schedule appointment and call patient to inform them. - Please contact requesting surgeon's office via preferred method (i.e, phone, fax) to inform them of need for appointment prior to surgery.  If applicable, this message will also be routed to pharmacy pool and/or primary cardiologist for input on holding anticoagulant/antiplatelet agent as requested below so that this information is available at time of patient's appointment.   Deberah Pelton, NP  04/03/2020, 12:33 PM

## 2020-04-03 NOTE — Telephone Encounter (Signed)
   [facility] Pre-operative Risk Assessment    HEARTCARE STAFF: - Please ensure there is not already an duplicate clearance open for this procedure. - Under Visit Info/Reason for Call, type in Other and utilize the format Clearance MM/DD/YY or Clearance TBD. Do not use dashes or single digits. - If request is for dental extraction, please clarify the # of teeth to be extracted.  Request for surgical clearance:  1. What type of surgery is being performed? LEFT KNEE SCOPE MENISECTOMY    2. When is this surgery scheduled? TBD   3. What type of clearance is required (medical clearance vs. Pharmacy clearance to hold med vs. Both)? MEDICAL  4. Are there any medications that need to be held prior to surgery and how long? PLAVIX   5. Practice name and name of physician performing surgery? [facility]; DR. Quillian Quince [facility]  6. What is the office phone number? [phone]   7.   What is the office fax number? [place].   Anesthesia type (None, local, MAC, general) ? CHOICE    Julaine Hua 04/03/2020, 12:17 PM  _________________________________________________________________   (provider comments below)

## 2020-04-04 ENCOUNTER — Other Ambulatory Visit: Payer: Self-pay

## 2020-04-04 ENCOUNTER — Telehealth: Payer: Self-pay | Admitting: Radiology

## 2020-04-04 ENCOUNTER — Ambulatory Visit: Payer: Medicare Other | Admitting: Interventional Cardiology

## 2020-04-04 ENCOUNTER — Encounter: Payer: Self-pay | Admitting: Interventional Cardiology

## 2020-04-04 VITALS — BP 146/76 | HR 76 | Ht 68.0 in | Wt 198.0 lb

## 2020-04-04 DIAGNOSIS — I498 Other specified cardiac arrhythmias: Secondary | ICD-10-CM

## 2020-04-04 DIAGNOSIS — I10 Essential (primary) hypertension: Secondary | ICD-10-CM

## 2020-04-04 DIAGNOSIS — Z01818 Encounter for other preprocedural examination: Secondary | ICD-10-CM

## 2020-04-04 DIAGNOSIS — E782 Mixed hyperlipidemia: Secondary | ICD-10-CM | POA: Diagnosis not present

## 2020-04-04 DIAGNOSIS — I2510 Atherosclerotic heart disease of native coronary artery without angina pectoris: Secondary | ICD-10-CM

## 2020-04-04 DIAGNOSIS — Z7189 Other specified counseling: Secondary | ICD-10-CM

## 2020-04-04 NOTE — Telephone Encounter (Signed)
Enrolled patient for a 3 day Zio XT monitor to be mailed to patients home  

## 2020-04-04 NOTE — Telephone Encounter (Addendum)
Patient to wear a 3 day monitor before being cleared for surgery.  Faxed to requesting office.

## 2020-04-04 NOTE — Patient Instructions (Signed)
Medication Instructions:  Your physician recommends that you continue on your current medications as directed. Please refer to the Current Medication list given to you today.  *If you need a refill on your cardiac medications before your next appointment, please call your pharmacy*   Lab Work: None If you have labs (blood work) drawn today and your tests are completely normal, you will receive your results only by: Marland Kitchen MyChart Message (if you have MyChart) OR . A paper copy in the mail If you have any lab test that is abnormal or we need to change your treatment, we will call you to review the results.   Testing/Procedures: Your physician recommends that you wear a monitor for 3 days.   Follow-Up: At [facility], you and your health needs are our priority.  As part of our continuing mission to provide you with exceptional heart care, we have created designated Provider Care Teams.  These Care Teams include your primary Cardiologist (physician) and Advanced Practice Providers (APPs -  Physician Assistants and Nurse Practitioners) who all work together to provide you with the care you need, when you need it.  We recommend signing up for the patient portal called "MyChart".  Sign up information is provided on this After Visit Summary.  MyChart is used to connect with patients for Virtual Visits (Telemedicine).  Patients are able to view lab/test results, encounter notes, upcoming appointments, etc.  Non-urgent messages can be sent to your provider as well.   To learn more about what you can do with MyChart, go to [email or web address].    Your next appointment:   12 month(s)  The format for your next appointment:   In Person  Provider:   You may see Sinclair Grooms, MD or one of the following Advanced Practice Providers on your designated Care Team:    Truitt Merle, NP  Cecilie Kicks, NP  Kathyrn Drown, NP    Other Instructions  [place] Monitor Instructions    Your physician has requested you wear your ZIO patch monitor 3 days.   This is a single patch monitor.  [employer] supplies one patch monitor per enrollment.  Additional stickers are not available.   Please do not apply patch if you will be having a Nuclear Stress Test, Echocardiogram, Cardiac CT, MRI, or Chest Xray during the time frame you would be wearing the monitor. The patch cannot be worn during these tests.  You cannot remove and re-apply the ZIO XT patch monitor.   Your ZIO patch monitor will be sent USPS Priority mail from [facility] directly to your home address. The monitor may also be mailed to a PO BOX if home delivery is not available.   It may take 3-5 days to receive your monitor after you have been enrolled.   Once you have received you monitor, please review enclosed instructions.  Your monitor has already been registered assigning a specific monitor serial # to you.   Applying the monitor   Shave hair from upper left chest.   Hold abrader disc by orange tab.  Rub abrader in 40 strokes over left upper chest as indicated in your monitor instructions.   Clean area with 4 enclosed alcohol pads .  Use all pads to assure are is cleaned thoroughly.  Let dry.   Apply patch as indicated in monitor instructions.  Patch will be place under collarbone on left side of chest with arrow pointing upward.   Rub patch adhesive wings for  2 minutes.Remove white label marked "1".  Remove white label marked "2".  Rub patch adhesive wings for 2 additional minutes.   While looking in a mirror, press and release button in center of patch.  A small green light will flash 3-4 times .  This will be your only indicator the monitor has been turned on.     Do not shower for the first 24 hours.  You may shower after the first 24 hours.   Press button if you feel a symptom. You will hear a small click.  Record Date, Time and Symptom in the Patient Log Book.   When you are ready to remove  patch, follow instructions on last 2 pages of Patient Log Book.  Stick patch monitor onto last page of Patient Log Book.   Place Patient Log Book in [place] box.  Use locking tab on box and tape box closed securely.  The [employer] and [employer] has [employer] on it.  Please place in mailbox as soon as possible.  Your physician should have your test results approximately 7 days after the monitor has been mailed back to [facility].   Call [place] at [phone] if you have questions regarding your ZIO XT patch monitor.  Call them immediately if you see an orange light blinking on your monitor.   If your monitor falls off in less than 4 days contact our Monitor department at [phone].  If your monitor becomes loose or falls off after 4 days call [employer] at [phone] for suggestions on securing your monitor.

## 2020-04-04 NOTE — Progress Notes (Signed)
Cardiology Office Note:    Date:  04/04/2020   ID:  MUSTAF ANTONACCI, DOB 1945-11-06, MRN [id number]  PCP:  Shirline Frees, MD  Cardiologist:  Sinclair Grooms, MD   Referring MD: Shirline Frees, MD   Chief Complaint  Patient presents with  . Coronary Artery Disease  . Congestive Heart Failure    History of Present Illness:    Franklin Ward is a 74 y.o. male with a hx of CAD, Htn, hyperlipidemia, NSTEMI 08/10/18, and PCI RCA with DES and ongoing medical therapy for LAD residual disease unfavorable for PCI.  He is here today to be cleared for upcoming left knee arthroscopy.  Overall, the patient is doing well from cardiac standpoint.  He denies angina and shortness of breath.  Activity has been curtailed by left knee arthritis that is preventing vigorous activity.  He has not needed nitroglycerin.  No episodes of syncope have occurred.  He has no complaints.  He is able to mow his own grass.  He weed eats a large yard.  He is in today without complaints.  Just mowed his grass.  EKG demonstrates ventricular bigeminy although he does not feel any palpitations or having any particular complaint.  He denies orthopnea, PND, ankle edema, syncope, and angina.  Past Medical History:  Diagnosis Date  . Coronary artery disease   . Diverticulitis     Past Surgical History:  Procedure Laterality Date  . CORONARY STENT INTERVENTION  08/10/2018  . CORONARY STENT INTERVENTION N/A 08/10/2018   Procedure: CORONARY STENT INTERVENTION;  Surgeon: Martinique, Peter M, MD;  Location: [place] [facility];  Service: Cardiovascular;  Laterality: N/A;  . LEFT HEART CATH AND CORONARY ANGIOGRAPHY N/A 08/10/2018   Procedure: LEFT HEART CATH AND CORONARY ANGIOGRAPHY;  Surgeon: Martinique, Peter M, MD;  Location: [place] [facility];  Service: Cardiovascular;  Laterality: N/A;    Current Medications: Current Meds  Medication Sig  . Ascorbic Acid (VITAMIN C PO) Take 1,000 mg by mouth daily.  Marland Kitchen aspirin 81 MG  tablet Take 81 mg by mouth daily.  Marland Kitchen atorvastatin (LIPITOR) 20 MG tablet TAKE 1 TABLET EACH DAY.  . Calcium Carb-Cholecalciferol (CALCIUM 600 + D PO) Take 1 tablet by mouth daily.   . carboxymethylcellulose (REFRESH PLUS) 0.5 % SOLN Place 1 drop into both eyes daily as needed (dry eyes).  . cholecalciferol (VITAMIN D3) 25 MCG (1000 UT) tablet Take 5,000 Units by mouth daily.   . clopidogrel (PLAVIX) 75 MG tablet TAKE 1 TABLET ONCE DAILY.  Marland Kitchen Cyanocobalamin (B-12) 2500 MCG TABS Take 1 tablet by mouth daily.   . Garlic 7062 MG CAPS Take 1,000 mg by mouth daily.   . metoprolol tartrate (LOPRESSOR) 50 MG tablet TAKE 1 TABLET BY MOUTH TWICE DAILY.  Marland Kitchen Misc Natural Products (OSTEO BI-FLEX ADV TRIPLE ST PO) Take 1 tablet by mouth 2 (two) times daily.   . Multiple Vitamin (MULTIVITAMIN) tablet Take 1 tablet by mouth daily.  . nitroGLYCERIN (NITROSTAT) 0.3 MG SL tablet Place 1 tablet (0.3 mg total) under the tongue every 5 (five) minutes as needed for chest pain.  Marland Kitchen Psyllium (METAMUCIL FIBER PO) Take by mouth See admin instructions. Mix 1 teaspoonful of powder into a glass of water and drink two to three times a day  . saccharomyces boulardii (FLORASTOR) 250 MG capsule Take 250 mg by mouth daily.   . Turmeric 500 MG CAPS Take 500 mg by mouth daily.   Marland Kitchen zinc gluconate 50 MG tablet  Take 50 mg by mouth daily.   Current Facility-Administered Medications for the 04/04/20 encounter (Office Visit) with Belva Crome, MD  Medication  . 0.9 %  sodium chloride infusion     Allergies:   Tetracyclines & related   Social History   Socioeconomic History  . Marital status: Single    Spouse name: Not on file  . Number of children: Not on file  . Years of education: Not on file  . Highest education level: Not on file  Occupational History  . Not on file  Tobacco Use  . Smoking status: Former Smoker    Quit date: 06/21/1980    Years since quitting: 39.8  . Smokeless tobacco: Former Systems developer    Types: Production manager  . Vaping Use: Never used  Substance and Sexual Activity  . Alcohol use: No  . Drug use: No  . Sexual activity: Not on file  Other Topics Concern  . Not on file  Social History Narrative  . Not on file   Social Determinants of Health   Financial Resource Strain:   . Difficulty of Paying Living Expenses: Not on file  Food Insecurity:   . Worried About Charity fundraiser in the Last Year: Not on file  . Ran Out of Food in the Last Year: Not on file  Transportation Needs:   . Lack of Transportation (Medical): Not on file  . Lack of Transportation (Non-Medical): Not on file  Physical Activity:   . Days of Exercise per Week: Not on file  . Minutes of Exercise per Session: Not on file  Stress:   . Feeling of Stress : Not on file  Social Connections:   . Frequency of Communication with Friends and Family: Not on file  . Frequency of Social Gatherings with Friends and Family: Not on file  . Attends Religious Services: Not on file  . Active Member of Clubs or Organizations: Not on file  . Attends Archivist Meetings: Not on file  . Marital Status: Not on file     Family History: The patient's family history includes Lung cancer in his father. There is no history of Colon cancer.  ROS:   Please see the history of present illness.    Vociferous.  No particular complaints.  Left knee is holding by physical activity.  No medication side effects.  All other systems reviewed and are negative.  EKGs/Labs/Other Studies Reviewed:    The following studies were reviewed today: Cardiac catheterization with stenting February 2020 Diagnostic Dominance: Right  Intervention   2D Doppler echocardiogram 08/11/2018: IMPRESSIONS    1. The left ventricle has normal systolic function with an ejection  fraction of 60-65%. The cavity size was normal. There is mildly increased  left ventricular wall thickness. Left ventricular diastolic Doppler  parameters are  consistent with impaired  relaxation.  2. No definite regional wall motion abnormalites.  3. The right ventricle has normal systolic function. The cavity was  normal. There is no increase in right ventricular wall thickness. Right  ventricular systolic pressure could not be assessed.  4. The mitral valve is normal in structure.  5. The tricuspid valve is normal in structure.  6. The aortic valve is normal in structure. Aortic valve regurgitation is  trivial by color flow Doppler.    EKG:  EKG normal sinus rhythm, ventricular bigeminy, and when compared to the most recent prior study  Recent Labs: 08/27/2019: ALT 28  Recent  Lipid Panel    Component Value Date/Time   CHOL 115 08/27/2019 0921   TRIG 86 08/27/2019 0921   HDL 41 08/27/2019 0921   CHOLHDL 2.8 08/27/2019 0921   CHOLHDL 5.4 08/10/2018 0403   VLDL 15 08/10/2018 0403   LDLCALC 57 08/27/2019 0921    Physical Exam:    VS:  BP (!) 146/76   Pulse 76   Ht 5\' 8"  (1.727 m)   Wt 198 lb (89.8 kg)   SpO2 97%   BMI 30.11 kg/m     Wt Readings from Last 3 Encounters:  04/04/20 198 lb (89.8 kg)  08/27/19 204 lb 9.6 oz (92.8 kg)  02/28/19 188 lb 3.2 oz (85.4 kg)     GEN: Compatible with age.  Wearing a hat.  Sure it is slightly damp feeling from recently being in a warm car and probably sweating.. No acute distress HEENT: Normal NECK: No JVD. LYMPHATICS: No lymphadenopathy CARDIAC: Peripheral pulses is around 40 bpm. Regular irregular rate and R rhythm.  Without murmur, gallop, or edema. VASCULAR:  Normal Pulses. No bruits. RESPIRATORY:  Clear to auscultation without rales, wheezing or rhonchi  ABDOMEN: Soft, non-tender, non-distended, No pulsatile mass, MUSCULOSKELETAL: No deformity  SKIN: Warm and dry NEUROLOGIC:  Alert and oriented x 3 PSYCHIATRIC:  Normal affect   ASSESSMENT:    1. Preoperative clearance   2. Coronary artery disease involving native coronary artery of native heart without angina pectoris     3. Mixed hyperlipidemia   4. Benign essential HTN   5. Ventricular bigeminy   6. Educated about COVID-19 virus infection    PLAN:    In order of problems listed above:  1. He is having no ischemic symptoms, heart failure symptoms, but is having asymptomatic ventricular bigeminy.  72-hour monitor will be done to assess whether or not this is of concern.  If the monitor is benign he will eventually be cleared to proceed with orthopedic outpatient surgery for meniscus tear. 2. Stable without angina.  Has residual diffuse disease and a genetically tiny LAD system.  Medical therapy has been prescribed. 3. Continue Lipitor 20 mg/day. 4. Continue metoprolol tartrate 50 mg twice daily 5. 72-hour monitor to assess burden of PVCs and to rule out more significant and or potentially life-threatening arrhythmia. 6. He has been vaccinated and does not suffer COVID-19 infection.   Medication Adjustments/Labs and Tests Ordered: Current medicines are reviewed at length with the patient today.  Concerns regarding medicines are outlined above.  Orders Placed This Encounter  Procedures  . LONG TERM MONITOR (3-14 DAYS)  . EKG 12-Lead   No orders of the defined types were placed in this encounter.   Patient Instructions  Medication Instructions:  Your physician recommends that you continue on your current medications as directed. Please refer to the Current Medication list given to you today.  *If you need a refill on your cardiac medications before your next appointment, please call your pharmacy*   Lab Work: None If you have labs (blood work) drawn today and your tests are completely normal, you will receive your results only by: Marland Kitchen MyChart Message (if you have MyChart) OR . A paper copy in the mail If you have any lab test that is abnormal or we need to change your treatment, we will call you to review the results.   Testing/Procedures: Your physician recommends that you wear a monitor for 3  days.   Follow-Up: At [facility], you and your health needs are  our priority.  As part of our continuing mission to provide you with exceptional heart care, we have created designated Provider Care Teams.  These Care Teams include your primary Cardiologist (physician) and Advanced Practice Providers (APPs -  Physician Assistants and Nurse Practitioners) who all work together to provide you with the care you need, when you need it.  We recommend signing up for the patient portal called "MyChart".  Sign up information is provided on this After Visit Summary.  MyChart is used to connect with patients for Virtual Visits (Telemedicine).  Patients are able to view lab/test results, encounter notes, upcoming appointments, etc.  Non-urgent messages can be sent to your provider as well.   To learn more about what you can do with MyChart, go to [email or web address].    Your next appointment:   12 month(s)  The format for your next appointment:   In Person  Provider:   You may see Sinclair Grooms, MD or one of the following Advanced Practice Providers on your designated Care Team:    Truitt Merle, NP  Cecilie Kicks, NP  Kathyrn Drown, NP    Other Instructions  [place] Monitor Instructions   Your physician has requested you wear your ZIO patch monitor 3 days.   This is a single patch monitor.  [employer] supplies one patch monitor per enrollment.  Additional stickers are not available.   Please do not apply patch if you will be having a Nuclear Stress Test, Echocardiogram, Cardiac CT, MRI, or Chest Xray during the time frame you would be wearing the monitor. The patch cannot be worn during these tests.  You cannot remove and re-apply the ZIO XT patch monitor.   Your ZIO patch monitor will be sent USPS Priority mail from [facility] directly to your home address. The monitor may also be mailed to a PO BOX if home delivery is not available.   It may take 3-5 days to  receive your monitor after you have been enrolled.   Once you have received you monitor, please review enclosed instructions.  Your monitor has already been registered assigning a specific monitor serial # to you.   Applying the monitor   Shave hair from upper left chest.   Hold abrader disc by orange tab.  Rub abrader in 40 strokes over left upper chest as indicated in your monitor instructions.   Clean area with 4 enclosed alcohol pads .  Use all pads to assure are is cleaned thoroughly.  Let dry.   Apply patch as indicated in monitor instructions.  Patch will be place under collarbone on left side of chest with arrow pointing upward.   Rub patch adhesive wings for 2 minutes.Remove white label marked "1".  Remove white label marked "2".  Rub patch adhesive wings for 2 additional minutes.   While looking in a mirror, press and release button in center of patch.  A small green light will flash 3-4 times .  This will be your only indicator the monitor has been turned on.     Do not shower for the first 24 hours.  You may shower after the first 24 hours.   Press button if you feel a symptom. You will hear a small click.  Record Date, Time and Symptom in the Patient Log Book.   When you are ready to remove patch, follow instructions on last 2 pages of Patient Log Book.  Stick patch monitor onto last page of Patient Log  Book.   Place Patient Log Book in [place] box.  Use locking tab on box and tape box closed securely.  The [employer] and [employer] has [employer] on it.  Please place in mailbox as soon as possible.  Your physician should have your test results approximately 7 days after the monitor has been mailed back to [facility].   Call [place] at [phone] if you have questions regarding your ZIO XT patch monitor.  Call them immediately if you see an orange light blinking on your monitor.   If your monitor falls off in less than 4 days contact our Monitor  department at [phone].  If your monitor becomes loose or falls off after 4 days call [employer] at [phone] for suggestions on securing your monitor.       Signed, Sinclair Grooms, MD  04/04/2020 4:09 PM    [facility]

## 2020-04-11 ENCOUNTER — Ambulatory Visit (INDEPENDENT_AMBULATORY_CARE_PROVIDER_SITE_OTHER): Payer: Medicare Other

## 2020-04-11 DIAGNOSIS — I493 Ventricular premature depolarization: Secondary | ICD-10-CM | POA: Diagnosis not present

## 2020-04-11 DIAGNOSIS — I498 Other specified cardiac arrhythmias: Secondary | ICD-10-CM | POA: Diagnosis not present

## 2020-04-24 ENCOUNTER — Other Ambulatory Visit: Payer: Self-pay | Admitting: Interventional Cardiology

## 2020-04-24 DIAGNOSIS — I498 Other specified cardiac arrhythmias: Secondary | ICD-10-CM

## 2020-04-24 DIAGNOSIS — I493 Ventricular premature depolarization: Secondary | ICD-10-CM

## 2020-04-27 IMAGING — DX DG CHEST 2V
2 series · 2 of 2 positions shown · non-contrast
Comparison: None available for comparison at time of study
interpretation.

CLINICAL DATA: Chest pain and shortness of breath.

EXAM:
CHEST - 2 VIEW

[w chest pa]
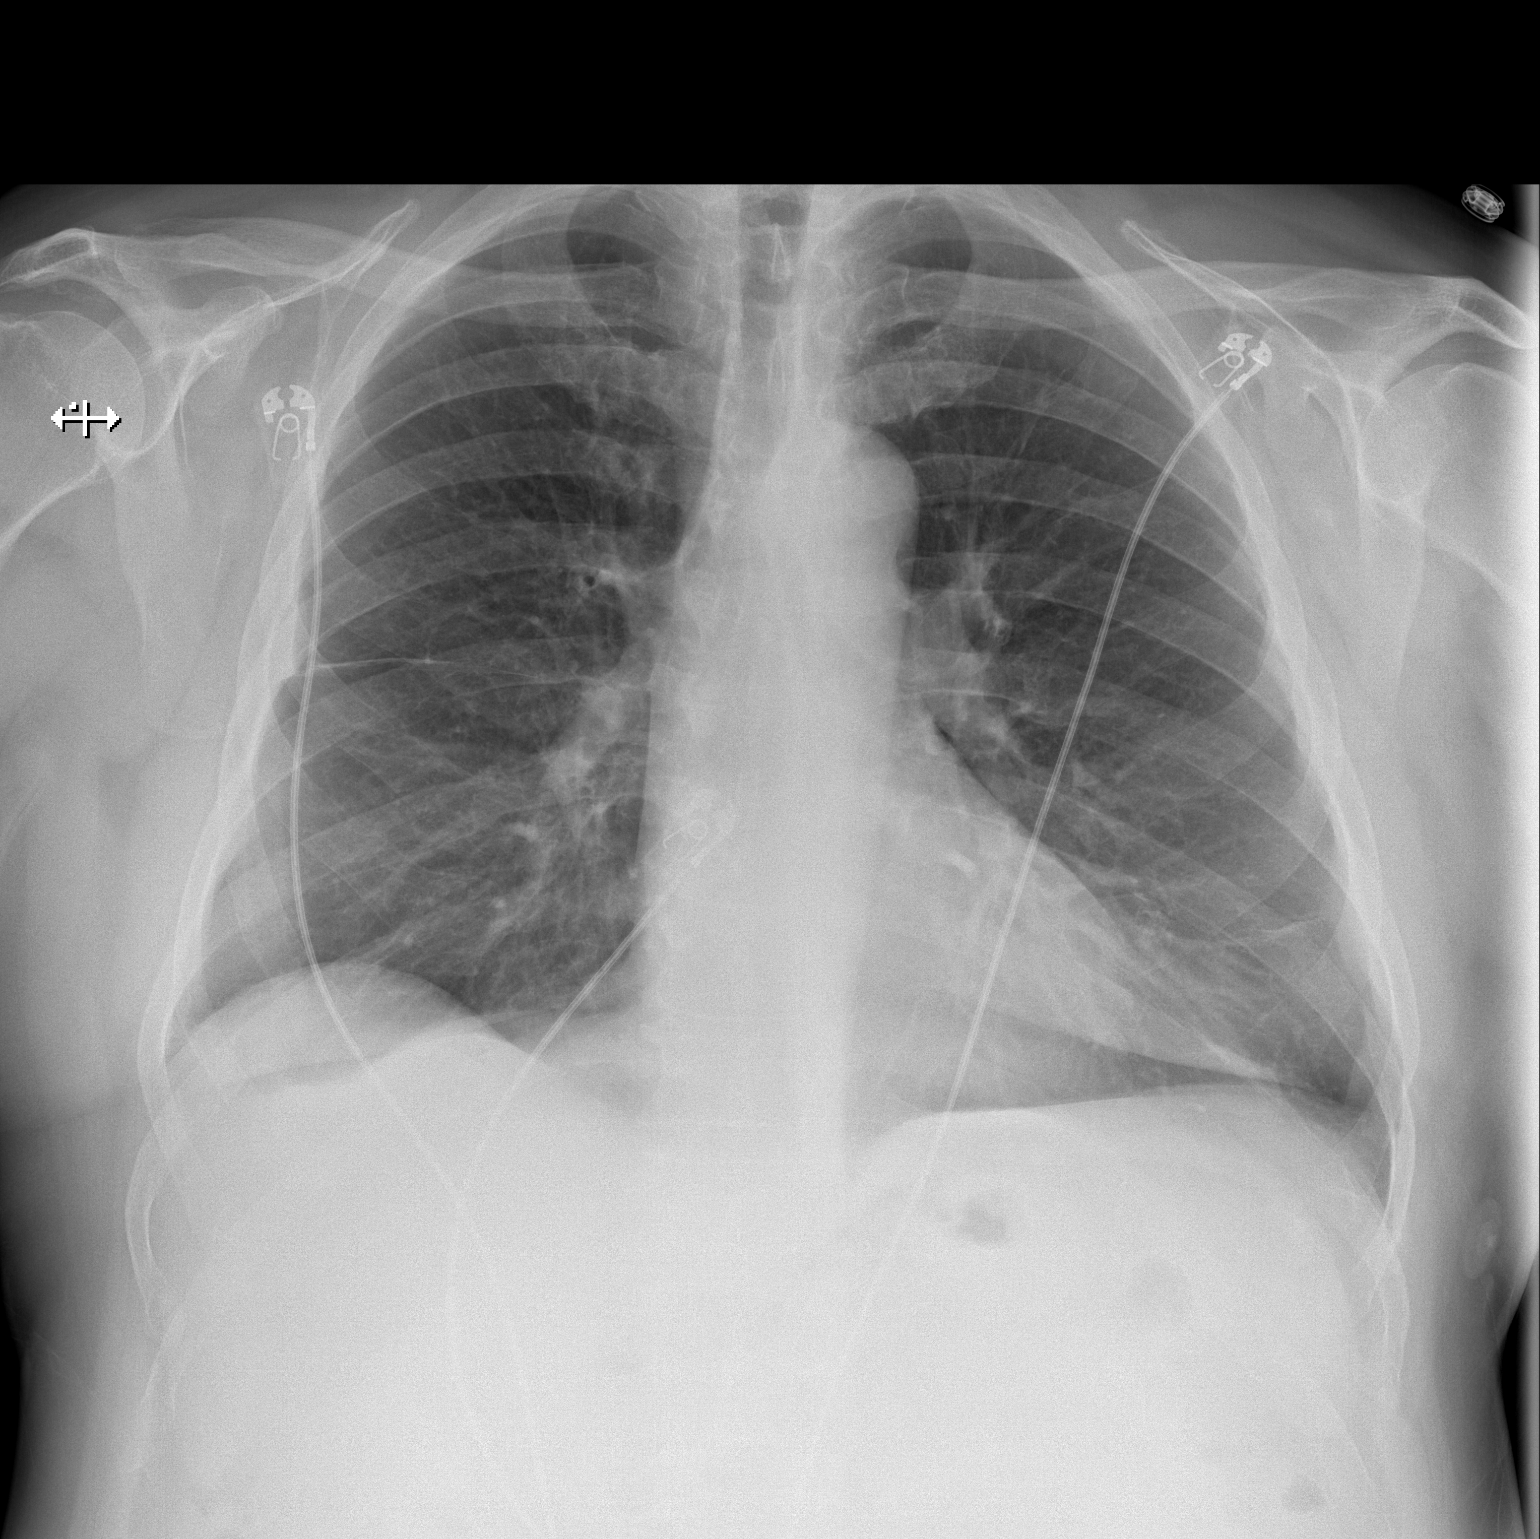

[w chest lat]
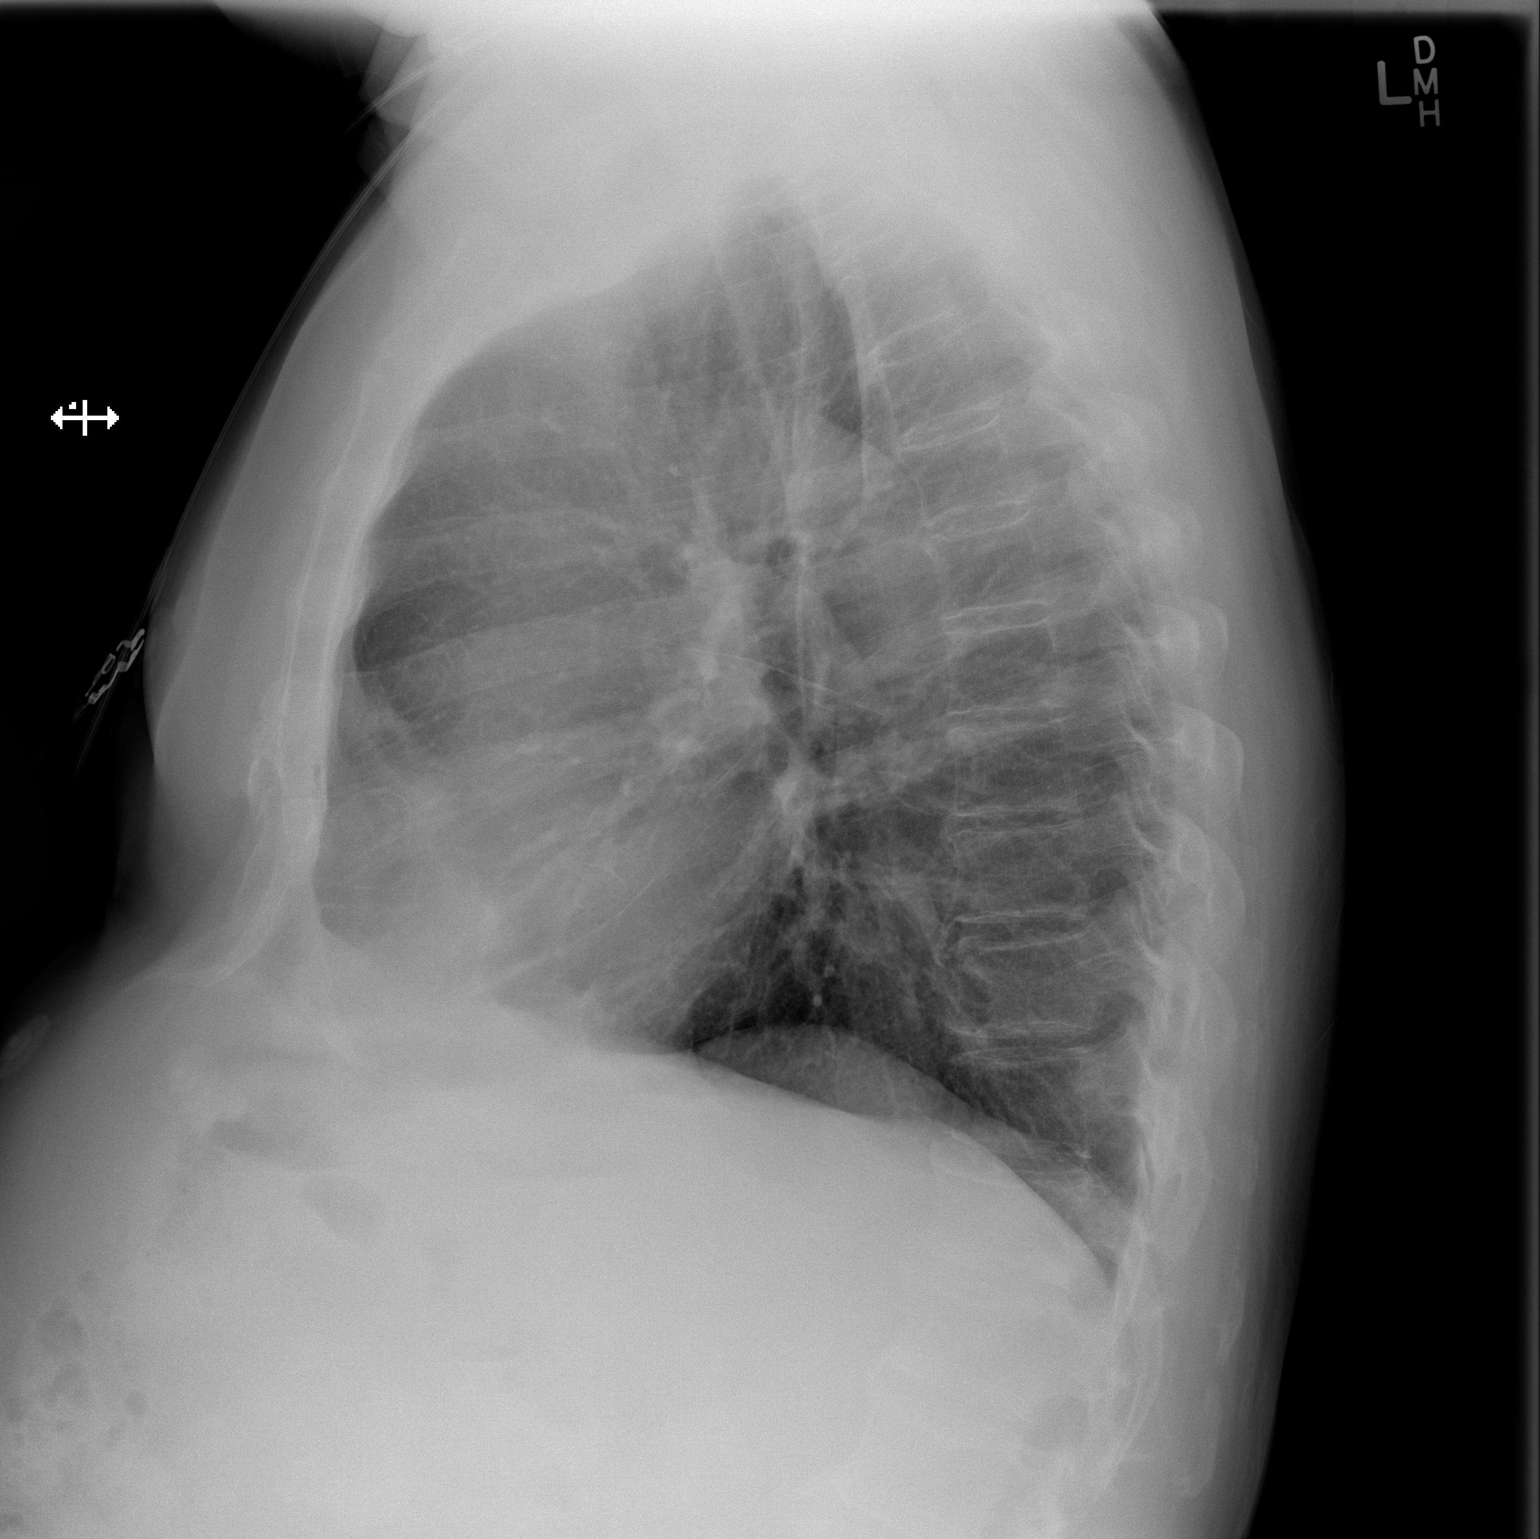

[2 of 2 positions shown; findings below may reference images not displayed]

FINDINGS: Cardiomediastinal silhouette is normal. Coronary artery
calcification and/or stent. Bandlike densities LEFT lung base. No
pleural effusion or focal consolidation. Mild hyperinflation. No
pneumothorax. Soft tissue planes and included osseous structures are
non suspicious.
IMPRESSION: 1. Mild hyperinflation.  LEFT lung base atelectasis/scarring.

## 2020-04-30 NOTE — Telephone Encounter (Signed)
Pt called in and stated has never heard back from his monitor results.  He would like to know the status of those results    Best number  [phone]

## 2020-04-30 NOTE — Telephone Encounter (Signed)
Spoke with pt and made him aware that monitor was just recently downloaded and sent to Dr. Tamala Julian.  Advised I will give a call with results once Dr. Tamala Julian reviews.  Pt appreciative for call.

## 2020-05-06 ENCOUNTER — Telehealth: Payer: Self-pay | Admitting: *Deleted

## 2020-05-06 DIAGNOSIS — I493 Ventricular premature depolarization: Secondary | ICD-10-CM

## 2020-05-06 NOTE — Telephone Encounter (Signed)
Spoke with pt and made him aware of recommendation.  Pt agreeable to see EP.  Advised I will place referral and scheduler will reach out to get him scheduled.  Pt appreciative for call.

## 2020-05-06 NOTE — Telephone Encounter (Signed)
-----   Message from Belva Crome, MD sent at 05/06/2020  1:12 PM EST ----- Let the patient know there is high burden of PVC's which over time could be deleterious. Needs EP opinion about therapy or not. A copy will be sent to Shirline Frees, MD

## 2020-05-13 ENCOUNTER — Ambulatory Visit (INDEPENDENT_AMBULATORY_CARE_PROVIDER_SITE_OTHER): Payer: Medicare Other | Admitting: Internal Medicine

## 2020-05-13 ENCOUNTER — Encounter: Payer: Self-pay | Admitting: Internal Medicine

## 2020-05-13 ENCOUNTER — Telehealth: Payer: Self-pay | Admitting: Internal Medicine

## 2020-05-13 ENCOUNTER — Other Ambulatory Visit: Payer: Self-pay

## 2020-05-13 DIAGNOSIS — I493 Ventricular premature depolarization: Secondary | ICD-10-CM | POA: Diagnosis not present

## 2020-05-13 MED ORDER — AMIODARONE HCL 200 MG PO TABS: 200.0000 mg | ORAL_TABLET | Freq: Every day | ORAL | 3 refills | Status: DC

## 2020-05-13 NOTE — Telephone Encounter (Signed)
Patient states that Dr. Lovena Le started him on amiodarone (PACERONE) 200 MG tablet today after his appointment. Patient would like someone to call him to confirm that this medication is okay to take with the following three medications:  Atorvastatin (LIPITOR) 20 MG tablet Clopidogrel (PLAVIX) 75 MG tablet Metoprolol tartrate (LOPRESSOR) 50 MG tablet   He states that the pharmacy where he get's the medications filled mentioned something about how the amiodarone interacts with his other medicines and wants to verify that it is okay to take them all. Please call/advise. Thank you!  If patient does not answer he states a voicemail can be left on whether it is okay to take or not.

## 2020-05-13 NOTE — Progress Notes (Signed)
HPI [name] is referred today by Dr. Tamala Julian for evaluation of PVC's. He is a pleasant 74 yo man with a h/o CAD, s/p PCI, preserved LV function by echo and dyslipidemia. He has no palpitations. He wore a cardiac monitor and had 24% PVC burden. He was in bigeminy when he last had an ECG a month ago. He does not feel much in the way of symptoms. He is pending knee surgery. He denies syncope. No edema. Allergies  Allergen Reactions  . Tetracyclines & Related Rash     Current Outpatient Medications  Medication Sig Dispense Refill  . amiodarone (PACERONE) 200 MG tablet Take 1 tablet (200 mg total) by mouth daily. 90 tablet 3  . Ascorbic Acid (VITAMIN C PO) Take 1,000 mg by mouth daily.    Marland Kitchen aspirin 81 MG tablet Take 81 mg by mouth daily.    Marland Kitchen atorvastatin (LIPITOR) 20 MG tablet TAKE 1 TABLET EACH DAY. 90 tablet 1  . Calcium Carb-Cholecalciferol (CALCIUM 600 + D PO) Take 1 tablet by mouth daily.     . carboxymethylcellulose (REFRESH PLUS) 0.5 % SOLN Place 1 drop into both eyes daily as needed (dry eyes).    . cholecalciferol (VITAMIN D3) 25 MCG (1000 UT) tablet Take 5,000 Units by mouth daily.     . clopidogrel (PLAVIX) 75 MG tablet TAKE 1 TABLET ONCE DAILY. 90 tablet 3  . Cyanocobalamin (B-12) 2500 MCG TABS Take 1 tablet by mouth daily.     . Garlic 1540 MG CAPS Take 1,000 mg by mouth daily.     . metoprolol tartrate (LOPRESSOR) 50 MG tablet TAKE 1 TABLET BY MOUTH TWICE DAILY. 180 tablet 3  . Misc Natural Products (OSTEO BI-FLEX ADV TRIPLE ST PO) Take 1 tablet by mouth 2 (two) times daily.     . Multiple Vitamin (MULTIVITAMIN) tablet Take 1 tablet by mouth daily.    . nitroGLYCERIN (NITROSTAT) 0.3 MG SL tablet Place 1 tablet (0.3 mg total) under the tongue every 5 (five) minutes as needed for chest pain. 30 tablet 3  . Psyllium (METAMUCIL FIBER PO) Take by mouth See admin instructions. Mix 1 teaspoonful of powder into a glass of water and drink two to three times a day    .  saccharomyces boulardii (FLORASTOR) 250 MG capsule Take 250 mg by mouth daily.     . Turmeric 500 MG CAPS Take 500 mg by mouth daily.     Marland Kitchen zinc gluconate 50 MG tablet Take 50 mg by mouth daily.     Current Facility-Administered Medications  Medication Dose Route Frequency Provider Last Rate Last Admin  . 0.9 %  sodium chloride infusion  500 mL Intravenous Continuous Irene Shipper, MD         Past Medical History:  Diagnosis Date  . Coronary artery disease   . Diverticulitis     ROS:   All systems reviewed and negative except as noted in the HPI.   Past Surgical History:  Procedure Laterality Date  . CORONARY STENT INTERVENTION  08/10/2018  . CORONARY STENT INTERVENTION N/A 08/10/2018   Procedure: CORONARY STENT INTERVENTION;  Surgeon: Martinique, Peter M, MD;  Location: [place] [facility];  Service: Cardiovascular;  Laterality: N/A;  . LEFT HEART CATH AND CORONARY ANGIOGRAPHY N/A 08/10/2018   Procedure: LEFT HEART CATH AND CORONARY ANGIOGRAPHY;  Surgeon: Martinique, Peter M, MD;  Location: [place] [facility];  Service: Cardiovascular;  Laterality: N/A;     Family History  Problem Relation Age of Onset  . Lung cancer Father   . Colon cancer Neg Hx      Social History   Socioeconomic History  . Marital status: Single    Spouse name: Not on file  . Number of children: Not on file  . Years of education: Not on file  . Highest education level: Not on file  Occupational History  . Not on file  Tobacco Use  . Smoking status: Former Smoker    Quit date: 06/21/1980    Years since quitting: 39.9  . Smokeless tobacco: Former Systems developer    Types: Secondary school teacher  . Vaping Use: Never used  Substance and Sexual Activity  . Alcohol use: No  . Drug use: No  . Sexual activity: Not on file  Other Topics Concern  . Not on file  Social History Narrative  . Not on file   Social Determinants of Health   Financial Resource Strain:   . Difficulty of Paying Living Expenses: Not on file    Food Insecurity:   . Worried About Charity fundraiser in the Last Year: Not on file  . Ran Out of Food in the Last Year: Not on file  Transportation Needs:   . Lack of Transportation (Medical): Not on file  . Lack of Transportation (Non-Medical): Not on file  Physical Activity:   . Days of Exercise per Week: Not on file  . Minutes of Exercise per Session: Not on file  Stress:   . Feeling of Stress : Not on file  Social Connections:   . Frequency of Communication with Friends and Family: Not on file  . Frequency of Social Gatherings with Friends and Family: Not on file  . Attends Religious Services: Not on file  . Active Member of Clubs or Organizations: Not on file  . Attends Archivist Meetings: Not on file  . Marital Status: Not on file  Intimate Partner Violence:   . Fear of Current or Ex-Partner: Not on file  . Emotionally Abused: Not on file  . Physically Abused: Not on file  . Sexually Abused: Not on file     BP 134/76   Pulse 65   Ht 5\' 8"  (1.727 m)   Wt 203 lb 9.6 oz (92.4 kg)   SpO2 97%   BMI 30.96 kg/m   Physical Exam:  Well appearing 74 yo man, NAD HEENT: Unremarkable Neck:  No JVD, no thyromegally Lymphatics:  No adenopathy Back:  No CVA tenderness Lungs:  Clear with no wheezes HEART:  IRegular rate rhythm, no murmurs, no rubs, no clicks Abd:  soft, positive bowel sounds, no organomegally, no rebound, no guarding Ext:  2 plus pulses, no edema, no cyanosis, no clubbing Skin:  No rashes no nodules Neuro:  CN II through XII intact, motor grossly intact  EKG - reviewed  Assess/Plan: 1. 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 Mexilitine and Sotalol, off label Multaq and amiodarone are considerations. I have reviewed the pro's and con's of all of the above as well as catheter ablation. The PVC"s are originating from the high base of the LV near the septum. He is at risk of a PVC induced CM. We will start low dose amiodarone. We will recheck labs  in 2-3 months. I would anticipate leaving him on the amiodarone for 2-3 years and then stopping. He has no interest in considering catheter ablation. 2. CAD - he is s/p PCI over a year ago  and denies anginal symptoms. 3. Dyslipidemia - he will continue his statin therapy.   Carleene Overlie Tira Lafferty,MD

## 2020-05-13 NOTE — Patient Instructions (Addendum)
Medication Instructions:  Your physician has recommended you make the following change in your medication:   1.  START taking amiodarone 200 mg- Take one tablet by mouth daily.  Labwork: You will get lab work on the day you come back to see Dr. Lovena Le on June 24, 2020 prior to your appointment:  Liver panel and TSH  Testing/Procedures: None ordered.  Follow-Up: Your physician wants you to follow-up in:  June 24, 2020 at 12:15 pm with Dr. Lovena Le at the [facility] office  Any Other Special Instructions Will Be Listed Below (If Applicable).  If you need a refill on your cardiac medications before your next appointment, please call your pharmacy.   Amiodarone tablets What is this medicine? AMIODARONE (a MEE oh da rone) is an antiarrhythmic drug. It helps make your heart beat regularly. Because of the side effects caused by this medicine, it is only used when other medicines have not worked. It is usually used for heartbeat problems that may be life threatening. This medicine may be used for other purposes; ask your health care provider or pharmacist if you have questions. COMMON BRAND NAME(S): Cordarone, Pacerone What should I tell my health care provider before I take this medicine? They need to know if you have any of these conditions:  liver disease  lung disease  other heart problems  thyroid disease  an unusual or allergic reaction to amiodarone, iodine, other medicines, foods, dyes, or preservatives  pregnant or trying to get pregnant  breast-feeding How should I use this medicine? Take this medicine by mouth with a glass of water. Follow the directions on the prescription label. You can take this medicine with or without food. However, you should always take it the same way each time. Take your doses at regular intervals. Do not take your medicine more often than directed. Do not stop taking except on the advice of your doctor or health care professional. A special  MedGuide will be given to you by the pharmacist with each prescription and refill. Be sure to read this information carefully each time. Talk to your pediatrician regarding the use of this medicine in children. Special care may be needed. Overdosage: If you think you have taken too much of this medicine contact a poison control center or emergency room at once. NOTE: This medicine is only for you. Do not share this medicine with others. What if I miss a dose? If you miss a dose, take it as soon as you can. If it is almost time for your next dose, take only that dose. Do not take double or extra doses. What may interact with this medicine? Do not take this medicine with any of the following medications:  abarelix  apomorphine  arsenic trioxide  certain antibiotics like erythromycin, gemifloxacin, levofloxacin, pentamidine  certain medicines for depression like amoxapine, tricyclic antidepressants  certain medicines for fungal infections like fluconazole, itraconazole, ketoconazole, posaconazole, voriconazole  certain medicines for irregular heart beat like disopyramide, dronedarone, ibutilide, propafenone, sotalol  certain medicines for malaria like chloroquine, halofantrine  cisapride  droperidol  haloperidol  hawthorn  maprotiline  methadone  phenothiazines like chlorpromazine, mesoridazine, thioridazine  pimozide  ranolazine  red yeast rice  vardenafil This medicine may also interact with the following medications:  antiviral medicines for HIV or AIDS  certain medicines for blood pressure, heart disease, irregular heart beat  certain medicines for cholesterol like atorvastatin, cerivastatin, lovastatin, simvastatin  certain medicines for hepatitis C like sofosbuvir and ledipasvir; sofosbuvir  certain  medicines for seizures like phenytoin  certain medicines for thyroid problems  certain medicines that treat or prevent blood clots like  warfarin  cholestyramine  cimetidine  clopidogrel  cyclosporine  dextromethorphan  diuretics  dofetilide  fentanyl  general anesthetics  grapefruit juice  lidocaine  loratadine  methotrexate  other medicines that prolong the QT interval (cause an abnormal heart rhythm)  procainamide  quinidine  rifabutin, rifampin, or rifapentine  St. John's Wort  trazodone  ziprasidone This list may not describe all possible interactions. Give your health care provider a list of all the medicines, herbs, non-prescription drugs, or dietary supplements you use. Also tell them if you smoke, drink alcohol, or use illegal drugs. Some items may interact with your medicine. What should I watch for while using this medicine? Your condition will be monitored closely when you first begin therapy. Often, this drug is first started in a hospital or other monitored health care setting. Once you are on maintenance therapy, visit your doctor or health care professional for regular checks on your progress. Because your condition and use of this medicine carry some risk, it is a good idea to carry an identification card, necklace or bracelet with details of your condition, medications, and doctor or health care professional. Dennis Bast may get drowsy or dizzy. Do not drive, use machinery, or do anything that needs mental alertness until you know how this medicine affects you. Do not stand or sit up quickly, especially if you are an older patient. This reduces the risk of dizzy or fainting spells. This medicine can make you more sensitive to the sun. Keep out of the sun. If you cannot avoid being in the sun, wear protective clothing and use sunscreen. Do not use sun lamps or tanning beds/booths. You should have regular eye exams before and during treatment. Call your doctor if you have blurred vision, see halos, or your eyes become sensitive to light. Your eyes may get dry. It may be helpful to use a  lubricating eye solution or artificial tears solution. If you are going to have surgery or a procedure that requires contrast dyes, tell your doctor or health care professional that you are taking this medicine. What side effects may I notice from receiving this medicine? Side effects that you should report to your doctor or health care professional as soon as possible:  allergic reactions like skin rash, itching or hives, swelling of the face, lips, or tongue  blue-gray coloring of the skin  blurred vision, seeing blue green halos, increased sensitivity of the eyes to light  breathing problems  chest pain  dark urine  fast, irregular heartbeat  feeling faint or light-headed  intolerance to heat or cold  nausea or vomiting  pain and swelling of the scrotum  pain, tingling, numbness in feet, hands  redness, blistering, peeling or loosening of the skin, including inside the mouth  spitting up blood  stomach pain  sweating  unusual or uncontrolled movements of body  unusually weak or tired  weight gain or loss  yellowing of the eyes or skin Side effects that usually do not require medical attention (report to your doctor or health care professional if they continue or are bothersome):  change in sex drive or performance  constipation  dizziness  headache  loss of appetite  trouble sleeping This list may not describe all possible side effects. Call your doctor for medical advice about side effects. You may report side effects to FDA at 1-800-FDA-1088. Where should  I keep my medicine? Keep out of the reach of children. Store at room temperature between 20 and 25 degrees C (68 and 77 degrees F). Protect from light. Keep container tightly closed. Throw away any unused medicine after the expiration date. NOTE: This sheet is a summary. It may not cover all possible information. If you have questions about this medicine, talk to your doctor, pharmacist, or health  care provider.  2020 Elsevier/Gold Standard (2018-05-10 13:44:04)

## 2020-05-13 NOTE — Telephone Encounter (Signed)
Pt called.  Advised ok to take amiodarone with current medications.  Advised Pt that blood work from PCP received and reviewed and WNL.  Pt thanked nurse for call.

## 2020-05-29 ENCOUNTER — Telehealth: Payer: Self-pay | Admitting: *Deleted

## 2020-05-29 NOTE — Telephone Encounter (Signed)
Hi Dr. Tamala Julian,  [name] has upcoming left knee scope menisectomy and is being asked to hold Plavix. He has a history of CAD s/p DES to RCA in 06/2018. You saw patient in 03/2020 for pre-op evaluation for this surgery. He was doing well from a cardiac standpoint but EKG showed ventricular bigeminy. Therefore, you ordered a 72 hour Holter Monitor with plans to clear patient for surgery if monitor was benign. 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 He was referred to EP and was seen by Franklin Ward on 05/13/2020. Decision made to start Amiodarone. Patient was not interested in ablation at the time.  1. Is he OK to proceed with knee surgery now? 2. Can you please comment on how long patient can hold Plavix prior to surgery?  Please route response back to P CV DIV PREOP.  Thank you! Salley Boxley

## 2020-05-29 NOTE — Telephone Encounter (Signed)
   [facility] Pre-operative Risk Assessment    HEARTCARE STAFF: - Please ensure there is not already an duplicate clearance open for this procedure. - Under Visit Info/Reason for Call, type in Other and utilize the format Clearance MM/DD/YY or Clearance TBD. Do not use dashes or single digits. - If request is for dental extraction, please clarify the # of teeth to be extracted.  Request for surgical clearance:  1. What type of surgery is being performed? LEFT KNEE SCOPE MENISECTOMY    2. When is this surgery scheduled? TBD   3. What type of clearance is required (medical clearance vs. Pharmacy clearance to hold med vs. Both)? MEDICAL  4. Are there any medications that need to be held prior to surgery and how long? PLAVIX   5. Practice name and name of physician performing surgery? [facility]; DR. Quillian Quince CAFFREY   6. What is the office phone number? [phone]   7.   What is the office fax number? [place].   Anesthesia type (None, local, MAC, general) ? CHOICE   Julaine Hua 05/29/2020, 2:00 PM  _________________________________________________________________   (provider comments below)

## 2020-05-31 NOTE — Telephone Encounter (Signed)
It is okay to hold Plavix 5 days and resume when safe. Okay to proceed with surgery.

## 2020-06-02 NOTE — Telephone Encounter (Signed)
   Primary Cardiologist: Sinclair Grooms, MD  Chart reviewed as part of pre-operative protocol coverage.   Per Dr. Tamala Julian: It is okay to hold Plavix 5 days and resume when safe. Okay to proceed with surgery  Therefore, based on ACC/AHA guidelines, the patient would be at acceptable risk for the planned procedure without further cardiovascular testing.   I will route this recommendation to the requesting party via Epic fax function and remove from pre-op pool. Please call with questions.  Tami Lin Izyk Marty, PA 06/02/2020, 11:38 AM

## 2020-06-24 ENCOUNTER — Other Ambulatory Visit: Payer: Self-pay

## 2020-06-24 ENCOUNTER — Ambulatory Visit: Payer: Medicare Other | Admitting: Internal Medicine

## 2020-06-24 ENCOUNTER — Encounter: Payer: Self-pay | Admitting: Internal Medicine

## 2020-06-24 ENCOUNTER — Other Ambulatory Visit: Payer: Medicare Other | Admitting: *Deleted

## 2020-06-24 VITALS — BP 130/74 | HR 73 | Ht 68.0 in | Wt 205.0 lb

## 2020-06-24 DIAGNOSIS — Z79899 Other long term (current) drug therapy: Secondary | ICD-10-CM | POA: Diagnosis not present

## 2020-06-24 DIAGNOSIS — I493 Ventricular premature depolarization: Secondary | ICD-10-CM

## 2020-06-24 LAB — TSH: TSH: 2.2 u[IU]/mL (ref 0.450–4.500)

## 2020-06-24 LAB — HEPATIC FUNCTION PANEL
ALT: 27 IU/L (ref 0–44)
AST: 23 IU/L (ref 0–40)
Albumin: 4.3 g/dL (ref 3.7–4.7)
Alkaline Phosphatase: 47 IU/L (ref 44–121)
Bilirubin Total: 0.4 mg/dL (ref 0.0–1.2)
Bilirubin, Direct: 0.14 mg/dL (ref 0.00–0.40)
Total Protein: 6.9 g/dL (ref 6.0–8.5)

## 2020-06-24 NOTE — Progress Notes (Signed)
HPI Mr. Keng returns today for followup. 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 He was started on amiodarone. He feels better. He thinks he has more energy. No syncope. No chest pain or sob.  Allergies  Allergen Reactions  . Erythromycin Other (See Comments)  . Tetracyclines & Related Rash     Current Outpatient Medications  Medication Sig Dispense Refill  . amiodarone (PACERONE) 200 MG tablet Take 1 tablet (200 mg total) by mouth daily. 90 tablet 3  . Ascorbic Acid (VITAMIN C PO) Take 1,000 mg by mouth daily.    Marland Kitchen atorvastatin (LIPITOR) 20 MG tablet TAKE 1 TABLET EACH DAY. 90 tablet 1  . Calcium Carb-Cholecalciferol (CALCIUM 600 + D PO) Take 1 tablet by mouth daily.     . carboxymethylcellulose (REFRESH PLUS) 0.5 % SOLN Place 1 drop into both eyes daily as needed (dry eyes).    . cholecalciferol (VITAMIN D3) 25 MCG (1000 UT) tablet Take 5,000 Units by mouth daily.     . clopidogrel (PLAVIX) 75 MG tablet TAKE 1 TABLET ONCE DAILY. 90 tablet 3  . Cyanocobalamin (B-12) 2500 MCG TABS Take 1 tablet by mouth daily.     . Garlic 1000 MG CAPS Take 1,000 mg by mouth daily.     . metoprolol tartrate (LOPRESSOR) 50 MG tablet TAKE 1 TABLET BY MOUTH TWICE DAILY. 180 tablet 3  . Misc Natural Products (OSTEO BI-FLEX ADV TRIPLE ST PO) Take 1 tablet by mouth 2 (two) times daily.     . Multiple Vitamin (MULTIVITAMIN) tablet Take 1 tablet by mouth daily.    . Psyllium (METAMUCIL FIBER PO) Take by mouth See admin instructions. Mix 1 teaspoonful of powder into a glass of water and drink two to three times a day    . saccharomyces boulardii (FLORASTOR) 250 MG capsule Take 250 mg by mouth daily.     . Turmeric 500 MG CAPS Take 500 mg by mouth daily.     Marland Kitchen zinc gluconate 50 MG tablet Take 50 mg by mouth daily.    . nitroGLYCERIN (NITROSTAT) 0.3 MG SL tablet Place 1 tablet (0.3 mg total) under the tongue every 5 (five)  minutes as needed for chest pain. 30 tablet 3   Current Facility-Administered Medications  Medication Dose Route Frequency Provider Last Rate Last Admin  . 0.9 %  sodium chloride infusion  500 mL Intravenous Continuous Hilarie Fredrickson, MD         Past Medical History:  Diagnosis Date  . Coronary artery disease   . Diverticulitis     ROS:   All systems reviewed and negative except as noted in the HPI.   Past Surgical History:  Procedure Laterality Date  . CORONARY STENT INTERVENTION  08/10/2018  . CORONARY STENT INTERVENTION N/A 08/10/2018   Procedure: CORONARY STENT INTERVENTION;  Surgeon: Swaziland, Peter M, MD;  Location: [facility] INVASIVE CV LAB;  Service: Cardiovascular;  Laterality: N/A;  . LEFT HEART CATH AND CORONARY ANGIOGRAPHY N/A 08/10/2018   Procedure: LEFT HEART CATH AND CORONARY ANGIOGRAPHY;  Surgeon: Swaziland, Peter M, MD;  Location: [facility] INVASIVE CV LAB;  Service: Cardiovascular;  Laterality: N/A;     Family History  Problem Relation Age of Onset  . Lung cancer Father   . Colon cancer Neg Hx      Social History   Socioeconomic History  . Marital status: Single  Spouse name: Not on file  . Number of children: Not on file  . Years of education: Not on file  . Highest education level: Not on file  Occupational History  . Not on file  Tobacco Use  . Smoking status: Former Smoker    Quit date: 06/21/1980    Years since quitting: 40.0  . Smokeless tobacco: Former Neurosurgeon    Types: Engineer, drilling  . Vaping Use: Never used  Substance and Sexual Activity  . Alcohol use: No  . Drug use: No  . Sexual activity: Not on file  Other Topics Concern  . Not on file  Social History Narrative  . Not on file   Social Determinants of Health   Financial Resource Strain: Not on file  Food Insecurity: Not on file  Transportation Needs: Not on file  Physical Activity: Not on file  Stress: Not on file  Social Connections: Not on file  Intimate Partner Violence: Not on file      BP 130/74   Pulse 73   Ht 5\' 8"  (1.727 m)   Wt 205 lb (93 kg)   SpO2 97%   BMI 31.17 kg/m   Physical Exam:  Well appearing NAD HEENT: Unremarkable Neck:  No JVD, no thyromegally Lymphatics:  No adenopathy Back:  No CVA tenderness Lungs:  Clear with no wheezes HEART:  Regular rate rhythm, no murmurs, no rubs, no clicks Abd:  soft, positive bowel sounds, no organomegally, no rebound, no guarding Ext:  2 plus pulses, no edema, no cyanosis, no clubbing Skin:  No rashes no nodules Neuro:  CN II through XII intact, motor grossly intact  EKG - NSR with PVC's   Assess/Plan: 1. PVC's - I recommend he continues his amiodarone. My hope is that his energy level will continue to improve and amio reduces further his PVC's. I would anticipate rechecking the heart monitor to see what his PVC burden is after another 6 months of amiodarone. 2. CAD - he denies anginal symptoms.  3. HTN - his bp is controlled. He is encouraged to remain active. 4. Dyslipidemia - he will continue lipitor.  Renette Hsu,MD

## 2020-06-24 NOTE — Patient Instructions (Addendum)

## 2020-07-09 ENCOUNTER — Telehealth: Payer: Self-pay | Admitting: Internal Medicine

## 2020-07-09 DIAGNOSIS — M1712 Unilateral primary osteoarthritis, left knee: Secondary | ICD-10-CM | POA: Diagnosis not present

## 2020-07-09 DIAGNOSIS — S83282A Other tear of lateral meniscus, current injury, left knee, initial encounter: Secondary | ICD-10-CM | POA: Diagnosis not present

## 2020-07-09 DIAGNOSIS — S83272A Complex tear of lateral meniscus, current injury, left knee, initial encounter: Secondary | ICD-10-CM | POA: Diagnosis not present

## 2020-07-09 DIAGNOSIS — G8918 Other acute postprocedural pain: Secondary | ICD-10-CM | POA: Diagnosis not present

## 2020-07-09 DIAGNOSIS — M94262 Chondromalacia, left knee: Secondary | ICD-10-CM | POA: Diagnosis not present

## 2020-07-09 NOTE — Telephone Encounter (Signed)
Regarding liver/ TSH results:  Attempted to call the patient. No answer- I left a detailed message of results on his home # (ok per DPR).  I asked that he call back with any further questions/ concerns.

## 2020-07-09 NOTE — Telephone Encounter (Signed)
Evans Lance, MD  06/25/2020 9:18 PM EST      Stable labs

## 2020-07-14 DIAGNOSIS — M25562 Pain in left knee: Secondary | ICD-10-CM | POA: Diagnosis not present

## 2020-07-14 DIAGNOSIS — M6281 Muscle weakness (generalized): Secondary | ICD-10-CM | POA: Diagnosis not present

## 2020-07-14 DIAGNOSIS — R262 Difficulty in walking, not elsewhere classified: Secondary | ICD-10-CM | POA: Diagnosis not present

## 2020-07-14 DIAGNOSIS — M25662 Stiffness of left knee, not elsewhere classified: Secondary | ICD-10-CM | POA: Diagnosis not present

## 2020-07-17 DIAGNOSIS — S83282D Other tear of lateral meniscus, current injury, left knee, subsequent encounter: Secondary | ICD-10-CM | POA: Diagnosis not present

## 2020-07-31 DIAGNOSIS — S83282D Other tear of lateral meniscus, current injury, left knee, subsequent encounter: Secondary | ICD-10-CM | POA: Diagnosis not present

## 2020-08-14 DIAGNOSIS — H2513 Age-related nuclear cataract, bilateral: Secondary | ICD-10-CM | POA: Diagnosis not present

## 2020-08-14 DIAGNOSIS — H52203 Unspecified astigmatism, bilateral: Secondary | ICD-10-CM | POA: Diagnosis not present

## 2020-08-14 DIAGNOSIS — H524 Presbyopia: Secondary | ICD-10-CM | POA: Diagnosis not present

## 2020-10-11 ENCOUNTER — Other Ambulatory Visit: Payer: Self-pay | Admitting: Interventional Cardiology

## 2020-10-15 ENCOUNTER — Other Ambulatory Visit: Payer: Self-pay

## 2020-10-15 ENCOUNTER — Telehealth: Payer: Self-pay | Admitting: Interventional Cardiology

## 2020-10-15 MED ORDER — ATORVASTATIN CALCIUM 20 MG PO TABS: ORAL_TABLET | ORAL | 1 refills | Status: DC

## 2020-10-15 MED ORDER — CLOPIDOGREL BISULFATE 75 MG PO TABS: 1.0000 | ORAL_TABLET | Freq: Every day | ORAL | 1 refills | Status: DC

## 2020-10-15 MED ORDER — AMIODARONE HCL 200 MG PO TABS: 200.0000 mg | ORAL_TABLET | Freq: Every day | ORAL | 2 refills | Status: DC

## 2020-10-15 NOTE — Telephone Encounter (Signed)
*  STAT* If patient is at the pharmacy, call can be transferred to refill team.   1. Which medications need to be refilled? (please list name of each medication and dose if known) clopidogrel (PLAVIX) 75 MG tablet amiodarone (PACERONE) 200 MG tablet atorvastatin (LIPITOR) 20 MG tablet 2. Which pharmacy/location (including street and city if local pharmacy) is medication to be sent to? [place], [address]  3. Do they need a 30 day or 90 day supply? 90 day supply

## 2020-10-15 NOTE — Telephone Encounter (Signed)
Pt's medication was sent to pt's pharmacy as requested. Confirmation received.  °

## 2020-10-15 NOTE — Telephone Encounter (Signed)
Pt's medications were sent to pt's pharmacy as requested. Confirmation received.  

## 2021-01-13 ENCOUNTER — Other Ambulatory Visit: Payer: Self-pay

## 2021-01-13 ENCOUNTER — Ambulatory Visit (INDEPENDENT_AMBULATORY_CARE_PROVIDER_SITE_OTHER): Payer: Medicare Other

## 2021-01-13 ENCOUNTER — Encounter: Payer: Self-pay | Admitting: Internal Medicine

## 2021-01-13 ENCOUNTER — Ambulatory Visit: Payer: Medicare Other | Admitting: Internal Medicine

## 2021-01-13 VITALS — BP 148/82 | HR 61 | Ht 68.0 in | Wt 214.4 lb

## 2021-01-13 DIAGNOSIS — Z79899 Other long term (current) drug therapy: Secondary | ICD-10-CM | POA: Diagnosis not present

## 2021-01-13 DIAGNOSIS — I493 Ventricular premature depolarization: Secondary | ICD-10-CM | POA: Diagnosis not present

## 2021-01-13 LAB — HEPATIC FUNCTION PANEL
ALT: 41 IU/L (ref 0–44)
AST: 30 IU/L (ref 0–40)
Albumin: 4.1 g/dL (ref 3.7–4.7)
Alkaline Phosphatase: 46 IU/L (ref 44–121)
Bilirubin Total: 0.4 mg/dL (ref 0.0–1.2)
Bilirubin, Direct: 0.12 mg/dL (ref 0.00–0.40)
Total Protein: 6.7 g/dL (ref 6.0–8.5)

## 2021-01-13 LAB — TSH: TSH: 1.86 u[IU]/mL (ref 0.450–4.500)

## 2021-01-13 NOTE — Progress Notes (Signed)
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 He is a pleasant 75 yo man with CAD, s/p NSTEMI who developed severe fatigue associated with PVC's and was started on amiodarone. He has done well in the interim. He c/o feeling hot a lot. He denies chest pain or sob. No edema. No syncope.  Allergies  Allergen Reactions   Erythromycin Other (See Comments)   Tetracyclines & Related Rash     Current Outpatient Medications  Medication Sig Dispense Refill   amiodarone (PACERONE) 200 MG tablet Take 1 tablet (200 mg total) by mouth daily. 90 tablet 2   Ascorbic Acid (VITAMIN C PO) Take 1,000 mg by mouth daily.     atorvastatin (LIPITOR) 20 MG tablet TAKE 1 TABLET EACH DAY. 90 tablet 1   Calcium Carb-Cholecalciferol (CALCIUM 600 + D PO) Take 1 tablet by mouth daily.      carboxymethylcellulose (REFRESH PLUS) 0.5 % SOLN Place 1 drop into both eyes daily as needed (dry eyes).     cholecalciferol (VITAMIN D3) 25 MCG (1000 UT) tablet Take 5,000 Units by mouth daily.      clopidogrel (PLAVIX) 75 MG tablet Take 1 tablet (75 mg total) by mouth daily. 90 tablet 1   Cyanocobalamin (B-12) 2500 MCG TABS Take 1 tablet by mouth daily.      metoprolol tartrate (LOPRESSOR) 50 MG tablet TAKE 1 TABLET BY MOUTH TWICE DAILY. 180 tablet 1   Misc Natural Products (OSTEO BI-FLEX ADV TRIPLE ST PO) Take 1 tablet by mouth 2 (two) times daily.      Multiple Vitamin (MULTIVITAMIN) tablet Take 1 tablet by mouth daily.     Psyllium (METAMUCIL FIBER PO) Take by mouth See admin instructions. Mix 1 teaspoonful of powder into a glass of water and drink two to three times a day     Turmeric 500 MG CAPS Take 500 mg by mouth daily.      zinc gluconate 50 MG tablet Take 50 mg by mouth daily.     nitroGLYCERIN (NITROSTAT) 0.3 MG SL tablet Place 1 tablet (0.3 mg total) under the tongue every 5 (five) minutes as needed for chest pain. 30 tablet 3   Current Facility-Administered Medications  Medication Dose  Route Frequency Provider Last Rate Last Admin   0.9 %  sodium chloride infusion  500 mL Intravenous Continuous Irene Shipper, MD         Past Medical History:  Diagnosis Date   Coronary artery disease    Diverticulitis     ROS:   All systems reviewed and negative except as noted in the HPI.   Past Surgical History:  Procedure Laterality Date   CORONARY STENT INTERVENTION  08/10/2018   CORONARY STENT INTERVENTION N/A 08/10/2018   Procedure: CORONARY STENT INTERVENTION;  Surgeon: Martinique, Peter M, MD;  Location: [place] [facility];  Service: Cardiovascular;  Laterality: N/A;   LEFT HEART CATH AND CORONARY ANGIOGRAPHY N/A 08/10/2018   Procedure: LEFT HEART CATH AND CORONARY ANGIOGRAPHY;  Surgeon: Martinique, Peter M, MD;  Location: [place] CV [facility];  Service: Cardiovascular;  Laterality: N/A;     Family History  Problem Relation Age of Onset   Lung cancer Father    Colon cancer Neg Hx      Social History   Socioeconomic History   Marital status: Single    Spouse name: Not on file   Number of children: Not on file   Years of education: Not on file  Highest education level: Not on file  Occupational History   Not on file  Tobacco Use   Smoking status: Former   Smokeless tobacco: Former    Types: Nurse, children's Use: Never used  Substance and Sexual Activity   Alcohol use: No   Drug use: No   Sexual activity: Not on file  Other Topics Concern   Not on file  Social History Narrative   Not on file   Social Determinants of Health   Financial Resource Strain: Not on file  Food Insecurity: Not on file  Transportation Needs: Not on file  Physical Activity: Not on file  Stress: Not on file  Social Connections: Not on file  Intimate Partner Violence: Not on file     BP (!) 148/82   Pulse 61   Ht '5\' 8"'$  (1.727 m)   Wt 214 lb 6.4 oz (97.3 kg)   SpO2 95%   BMI 32.60 kg/m   Physical Exam:  Well appearing 75 yo man, NAD HEENT: Unremarkable Neck:  No  JVD, no thyromegally Lymphatics:  No adenopathy Back:  No CVA tenderness Lungs:  Clear with no wheezes HEART:  Regular rate rhythm, no murmurs, no rubs, no clicks Abd:  soft, positive bowel sounds, no organomegally, no rebound, no guarding Ext:  2 plus pulses, no edema, no cyanosis, no clubbing Skin:  No rashes no nodules Neuro:  CN II through XII intact, motor grossly intact  EKG - nsr  Assess/Plan:  1. PVC's - I recommend he continues his amiodarone. His energy level has improved. He will check a 3 day zio monitor and I would anticipate reducing his dose of amiodarone.  2. CAD - he denies anginal symptoms. 3. HTN - his bp is controlled. He is encouraged to remain active. 4. Dyslipidemia - he will continue lipitor.   Carleene Overlie Lashawna Poche,MD

## 2021-01-13 NOTE — Progress Notes (Unsigned)
Applied a 3 day Zio XT monitor to patient

## 2021-01-13 NOTE — Patient Instructions (Addendum)
Medication Instructions:  Your physician recommends that you continue on your current medications as directed. Please refer to the Current Medication list given to you today.  Labwork: You will get lab work today:  TSH and liver panel  Testing/Procedures: Your physician has recommended that you wear a holter monitor. Holter monitors are medical devices that record the heart's electrical activity. Doctors most often use these monitors to diagnose arrhythmias. Arrhythmias are problems with the speed or rhythm of the heartbeat. The monitor is a small, portable device. You can wear one while you do your normal daily activities. This is usually used to diagnose what is causing palpitations/syncope (passing out).  You will wear a 3 day ZIO monitor  Follow-Up: Your physician wants you to follow-up in: based on results of heart monitor with Cristopher Peru, MD   Any Other Special Instructions Will Be Listed Below (If Applicable).  If you need a refill on your cardiac medications before your next appointment, please call your pharmacy.   Your physician has recommended that you wear a Zio monitor.   This monitor is a medical device that records the heart's electrical activity. Doctors most often use these monitors to diagnose arrhythmias. Arrhythmias are problems with the speed or rhythm of the heartbeat. The monitor is a small device applied to your chest. You can wear one while you do your normal daily activities. While wearing this monitor if you have any symptoms to push the button and record what you felt. Once you have worn this monitor for the period of time provider prescribed (Usually 14 days), you will return the monitor device in the postage paid box. Once it is returned they will download the data collected and provide [place] with a report which the provider will then review and we will call you with those results. Important tips:  Avoid showering during the first 24 hours of wearing the  monitor. Avoid excessive sweating to help maximize wear time. Do not submerge the device, no hot tubs, and no swimming pools. Keep any lotions or oils away from the patch. After 24 hours you may shower with the patch on. Take brief showers with your back facing the shower head.  Do not remove patch once it has been placed because that will interrupt data and decrease adhesive wear time. Push the button when you have any symptoms and write down what you were feeling. Once you have completed wearing your monitor, remove and place into box which has postage paid and place in your outgoing mailbox.  If for some reason you have misplaced your box then call our office and we can provide another box and/or mail it off for you.

## 2021-01-15 ENCOUNTER — Telehealth: Payer: Self-pay | Admitting: Internal Medicine

## 2021-01-15 NOTE — Telephone Encounter (Signed)
Franklin Ward is returning Ann's call in regards to his lab results. Please advise.

## 2021-01-15 NOTE — Telephone Encounter (Signed)
Spoke with pt and advised per Dr Lovena Le labs appear to be normal.  Pt verbalizes understanding and thanked Therapist, sports for the call.

## 2021-01-16 DIAGNOSIS — Z79899 Other long term (current) drug therapy: Secondary | ICD-10-CM | POA: Diagnosis not present

## 2021-01-16 DIAGNOSIS — I493 Ventricular premature depolarization: Secondary | ICD-10-CM | POA: Diagnosis not present

## 2021-01-22 DIAGNOSIS — I493 Ventricular premature depolarization: Secondary | ICD-10-CM | POA: Diagnosis not present

## 2021-01-30 ENCOUNTER — Telehealth: Payer: Self-pay

## 2021-01-30 MED ORDER — AMIODARONE HCL 200 MG PO TABS: ORAL_TABLET | ORAL | 3 refills | Status: DC

## 2021-01-30 NOTE — Telephone Encounter (Signed)
Call back received from Pt.  Advised of new orders.  Pt indicates understanding.

## 2021-01-30 NOTE — Telephone Encounter (Signed)
Per Dr. Lovena Le-  Based on results of your HM--Reduce amiodarone 200 mg-Take one tablet by mouth daily Monday through Friday.  Do NOT take on Saturday or Sunday.  F/u in one year.  Left message to call back.

## 2021-01-30 NOTE — Telephone Encounter (Signed)
-----   Message from Evans Lance, MD sent at 01/25/2021 10:17 PM EDT ----- Reassuring heart monitor.

## 2021-04-17 DIAGNOSIS — E7800 Pure hypercholesterolemia, unspecified: Secondary | ICD-10-CM | POA: Diagnosis not present

## 2021-04-17 DIAGNOSIS — I2510 Atherosclerotic heart disease of native coronary artery without angina pectoris: Secondary | ICD-10-CM | POA: Diagnosis not present

## 2021-04-17 DIAGNOSIS — Z23 Encounter for immunization: Secondary | ICD-10-CM | POA: Diagnosis not present

## 2021-04-17 DIAGNOSIS — I10 Essential (primary) hypertension: Secondary | ICD-10-CM | POA: Diagnosis not present

## 2021-04-17 DIAGNOSIS — I493 Ventricular premature depolarization: Secondary | ICD-10-CM | POA: Diagnosis not present

## 2021-05-04 ENCOUNTER — Other Ambulatory Visit: Payer: Self-pay | Admitting: Internal Medicine

## 2021-05-06 ENCOUNTER — Telehealth: Payer: Self-pay | Admitting: Interventional Cardiology

## 2021-05-06 MED ORDER — METOPROLOL TARTRATE 50 MG PO TABS: 50.0000 mg | ORAL_TABLET | Freq: Two times a day (BID) | ORAL | 0 refills | Status: DC

## 2021-05-06 MED ORDER — CLOPIDOGREL BISULFATE 75 MG PO TABS: 75.0000 mg | ORAL_TABLET | Freq: Every day | ORAL | 0 refills | Status: DC

## 2021-05-06 MED ORDER — ATORVASTATIN CALCIUM 20 MG PO TABS: ORAL_TABLET | ORAL | 0 refills | Status: DC

## 2021-05-06 NOTE — Telephone Encounter (Signed)
Pt c/o medication issue:  1. Name of Medication: last set of refill  2. How are you currently taking this medication (dosage and times per day)?   3. Are you having a reaction (difficulty breathing--STAT)? no  4. What is your medication issue? Patient doesn't understand why he didn't get a 90 supply on his medication refill

## 2021-05-06 NOTE — Telephone Encounter (Signed)
  *  STAT* If patient is at the pharmacy, call can be transferred to refill team.   1. Which medications need to be refilled? (please list name of each medication and dose if known)   clopidogrel (PLAVIX) 75 MG tablet    2. Which pharmacy/location (including street and city if local pharmacy) is medication to be sent to? [place], [address]  3. Do they need a 30 day or 90 day supply? 90 days  Pt made an appt with Richardson Dopp on 12/7. Pt need refill today

## 2021-05-06 NOTE — Telephone Encounter (Signed)
Pt's medications were sent to pt's pharmacy as requested. Confirmation received.  

## 2021-05-26 NOTE — Progress Notes (Signed)
Cardiology Clinic Note   Patient Name: Franklin Ward Date of Encounter: 05/27/2021  Primary Care Provider:  Shirline Frees, MD Primary Cardiologist:  Sinclair Grooms, MD  Patient Profile   75 year old male with a history of CAD s/p NSTEMI s/p DESx2 to RCA 07/2018, residual disease not amenable to PCI (mLAD 90%, D1 90% , Ost 1st Sept 60% -medical management), symptomatic PVCs, hypertension, hyperlipidemia, and arthritis who presents for follow-up related to PVCs and CAD.   Past Medical History    Past Medical History:  Diagnosis Date   Coronary artery disease    Diverticulitis    Past Surgical History:  Procedure Laterality Date   CORONARY STENT INTERVENTION  08/10/2018   CORONARY STENT INTERVENTION N/A 08/10/2018   Procedure: CORONARY STENT INTERVENTION;  Surgeon: Martinique, Peter M, MD;  Location: [facility];  Service: Cardiovascular;  Laterality: N/A;   LEFT HEART CATH AND CORONARY ANGIOGRAPHY N/A 08/10/2018   Procedure: LEFT HEART CATH AND CORONARY ANGIOGRAPHY;  Surgeon: Martinique, Peter M, MD;  Location: [facility];  Service: Cardiovascular;  Laterality: N/A;    Allergies  Allergies  Allergen Reactions   Bee Venom Hives   Erythromycin Other (See Comments)   Tetracyclines & Related Rash    History of Present Illness   75 year old male with the above past medical history including CAD s/p NSTEMI s/p DES x2 to RCA 07/2018, residual  disease not amenable to PCI (mLAD 90%, D1 90% , Ost 1st Sept 60% -medical management), symptomatic PVCs, hypertension, hyperlipidemia, and arthritis.   Echo 07/2018 showed EF 60-65%, no RWMA, impaired relaxation, trivial AV regurgitation. EKG at follow up visit in 03/2020 showed ventricular bigeminy. He was asymptomatic at the time. 3 day monitor 2021 showed high PVC burden (24%). 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 He was last seen in the office on 01/13/2021. Repeat 3 day  monitor was reassuring (NSR/SB, rare PACs, occ PVCs, no other arrhythmias or prolonged pauses), and amiodarone was reduced to 200 mg daily.   Since his last visit he has been doing well overall from a cardiac standpoint.  He denies chest pain, shortness of breath, dizziness, or palpitations. His biggest complaint is that he has experienced severe arthralgias in his back and knees over the past year that have greatly limited his activity. It was thought his symptoms were secondary to arthritis. He underwent left knee scope meniscectomy in December 2021 with no improvement. After discussing his symptoms with a neighbor, he decided to stop taking his atorvastatin 20 mg daily in 03/2021. Since then, he is back to walking half a mile daily and has had a drastic improvement in his arthralgias. He denies pnd, orthopnea, n, v, dizziness, syncope, edema, weight gain, or early satiety.  Home Medications    Current Outpatient Medications  Medication Sig Dispense Refill   amiodarone (PACERONE) 200 MG tablet Take one tablet by mouth daily Monday through Friday.  Do NOT take on Saturday or Sunday. 90 tablet 3   Ascorbic Acid (VITAMIN C PO) Take 1,000 mg by mouth daily.     Calcium Carb-Cholecalciferol (CALCIUM 600 + D PO) Take 1 tablet by mouth daily.      carboxymethylcellulose (REFRESH PLUS) 0.5 % SOLN Place 1 drop into both eyes daily as needed (dry eyes).     cholecalciferol (VITAMIN D3) 25 MCG (1000 UT) tablet Take 5,000 Units by mouth daily.  clopidogrel (PLAVIX) 75 MG tablet Take 1 tablet (75 mg total) by mouth daily. 90 tablet 0   Cyanocobalamin (B-12) 2500 MCG TABS Take 1 tablet by mouth daily.      ezetimibe (ZETIA) 10 MG tablet Take 1 tablet (10 mg total) by mouth daily. 90 tablet 3   metoprolol tartrate (LOPRESSOR) 50 MG tablet Take 1 tablet (50 mg total) by mouth 2 (two) times daily. 180 tablet 0   Misc Natural Products (OSTEO BI-FLEX ADV TRIPLE ST PO) Take 1 tablet by mouth 2 (two) times daily.       Multiple Vitamin (MULTIVITAMIN) tablet Take 1 tablet by mouth daily.     nitroGLYCERIN (NITROSTAT) 0.3 MG SL tablet Place 1 tablet (0.3 mg total) under the tongue every 5 (five) minutes as needed for chest pain. 30 tablet 3   Psyllium (METAMUCIL FIBER PO) Take by mouth See admin instructions. Mix 1 teaspoonful of powder into a glass of water and drink two to three times a day     Turmeric 500 MG CAPS Take 500 mg by mouth daily.      zinc gluconate 50 MG tablet Take 50 mg by mouth daily.     Current Facility-Administered Medications  Medication Dose Route Frequency Provider Last Rate Last Admin   0.9 %  sodium chloride infusion  500 mL Intravenous Continuous Irene Shipper, MD         Family History    Family History  Problem Relation Age of Onset   Lung cancer Father    Colon cancer Neg Hx    He indicated that his mother is deceased. He indicated that his father is deceased. He indicated that the status of his neg hx is unknown.  Social History    Social History   Socioeconomic History   Marital status: Single    Spouse name: Not on file   Number of children: Not on file   Years of education: Not on file   Highest education level: Not on file  Occupational History   Not on file  Tobacco Use   Smoking status: Former   Smokeless tobacco: Former    Types: Nurse, children's Use: Never used  Substance and Sexual Activity   Alcohol use: No   Drug use: No   Sexual activity: Not on file  Other Topics Concern   Not on file  Social History Narrative   Not on file   Social Determinants of Health   Financial Resource Strain: Not on file  Food Insecurity: Not on file  Transportation Needs: Not on file  Physical Activity: Not on file  Stress: Not on file  Social Connections: Not on file  Intimate Partner Violence: Not on file     Review of Systems    General:  Arthralgias to back, bilateral knees, improving. No chills, fever, night sweats or weight changes.   Cardiovascular:  No chest pain, dyspnea on exertion, edema, orthopnea, palpitations, paroxysmal nocturnal dyspnea. Dermatological: No rash, lesions/masses Respiratory: No cough, dyspnea Urologic: No hematuria, dysuria Abdominal: No nausea, vomiting, diarrhea, bright red blood per rectum, melena, or hematemesis Neurologic: No visual changes, wkns, changes in mental status. All other systems reviewed and are otherwise negative except as noted above.     Physical Exam    VS:  BP 140/62   Pulse 63   Ht 5\' 8"  (1.727 m)   Wt 211 lb 3.2 oz (95.8 kg)   SpO2 98%   BMI 32.11  kg/m      GEN: Well nourished, well developed, in no acute distress. HEENT: normal. Neck: Supple, no JVD, carotid bruits, or masses. Cardiac: RRR, no murmurs, rubs, or gallops. No clubbing, cyanosis, edema.  Radials/DP/PT 2+ and equal bilaterally.  Respiratory:  Respirations regular and unlabored, clear to auscultation bilaterally. GI: Soft, nontender, nondistended, BS + x 4. MS: no deformity or atrophy. Skin: warm and dry, no rash. Neuro:  Strength and sensation are intact. Psych: Normal affect.  Accessory Clinical Findings    ECG personally reviewed by me today- NSR, 63 bpm, baseline artifact - No acute changes  Lab Results  Component Value Date   WBC 8.9 08/10/2018   HGB 14.0 08/10/2018   HCT 44.2 08/10/2018   MCV 91.9 08/10/2018   PLT 235 08/10/2018   Lab Results  Component Value Date   CREATININE 0.77 08/21/2018   BUN 13 08/21/2018   NA 138 08/21/2018   K 5.2 08/21/2018   CL 101 08/21/2018   CO2 22 08/21/2018   Lab Results  Component Value Date   ALT 41 01/13/2021   AST 30 01/13/2021   ALKPHOS 46 01/13/2021   BILITOT 0.4 01/13/2021   Lab Results  Component Value Date   CHOL 115 08/27/2019   HDL 41 08/27/2019   LDLCALC 57 08/27/2019   TRIG 86 08/27/2019   CHOLHDL 2.8 08/27/2019    Lab Results  Component Value Date   HGBA1C 5.8 (H) 08/10/2018    Assessment & Plan   1. CAD: S/p  NSTEMI s/p DESx2 to RCA 07/2018, residual disease not amenable to PCI (mLAD 90%, D1 90% , Ost 1st Sept 60% -medical management). He denies any symptoms concerning for angina.  Recently stopped taking his atorvastatin in the setting of debilitating arthralgias.  Since then his symptoms have greatly improved, however, his LDL is up from 57 in 2021 to 107 on 04/18/2019.  Discussed benefit of goal LDL less than 70.  He declines any further statin therapy.  We will start Zetia 10 mg daily.  Repeat lipids/LFTs in 3 months.  If LDL remains elevated above goal, consider referral to lipid clinic for further management. Continue Plavix, metoprolol tartrate 50 mg twice daily.  2. H/o PVCs: Followed by EP, on amiodarone 200 mg five days a week. Most recent monitor reassuring. Asymptomatic. TSH, Hep fucntion panel wnl 12/2020. Eye exam scheduled for 07/2021.  He declines chest x-ray at this time.  Follow-up with EP as scheduled.  3. Hypertension: Blood pressure slightly elevated in office today, 140/62.  Home readings in the 130s over 70s.  Continue metoprolol as above. Heart rate in the 60s.  If additional blood pressure medication is required future, would consider additional medication over titration of metoprolol.  Monitor blood pressure and report consistent readings >140/80.   4. Hyperlipidemia: See #1 above.   5. Disposition: Start Zetia 10 mg daily.  Repeat lipids, LFTs in 3 months.  Follow-up with EP as scheduled.  Follow-up in 6 months.  Lenna Sciara, NP 05/27/2021, 12:39 PM

## 2021-05-27 ENCOUNTER — Encounter: Payer: Self-pay | Admitting: Nurse Practitioner

## 2021-05-27 ENCOUNTER — Ambulatory Visit: Payer: Medicare Other | Admitting: Nurse Practitioner

## 2021-05-27 ENCOUNTER — Other Ambulatory Visit: Payer: Self-pay

## 2021-05-27 VITALS — BP 140/62 | HR 63 | Ht 68.0 in | Wt 211.2 lb

## 2021-05-27 DIAGNOSIS — I493 Ventricular premature depolarization: Secondary | ICD-10-CM

## 2021-05-27 DIAGNOSIS — I2510 Atherosclerotic heart disease of native coronary artery without angina pectoris: Secondary | ICD-10-CM | POA: Diagnosis not present

## 2021-05-27 DIAGNOSIS — Z79899 Other long term (current) drug therapy: Secondary | ICD-10-CM | POA: Diagnosis not present

## 2021-05-27 DIAGNOSIS — I10 Essential (primary) hypertension: Secondary | ICD-10-CM | POA: Diagnosis not present

## 2021-05-27 DIAGNOSIS — E782 Mixed hyperlipidemia: Secondary | ICD-10-CM

## 2021-05-27 MED ORDER — EZETIMIBE 10 MG PO TABS: 10.0000 mg | ORAL_TABLET | Freq: Every day | ORAL | 3 refills | Status: DC

## 2021-05-27 NOTE — Patient Instructions (Signed)
Medication Instructions:   START Zetia one (1) tablet by mouth ( 10 mg) daily.    *If you need a refill on your cardiac medications before your next appointment, please call your pharmacy*   Lab Work:  Your physician recommends that you return for a FASTING lipid profile/lft. On Tuesday, March 21. You can come in on the day of your appointment anytime between 7:30-4:30 fasting from midnight the night before.     If you have labs (blood work) drawn today and your tests are completely normal, you will receive your results only by: [place] (if you have MyChart) OR A paper copy in the mail If you have any lab test that is abnormal or we need to change your treatment, we will call you to review the results.   Testing/Procedures:     Follow-Up: At [facility], you and your health needs are our priority.  As part of our continuing mission to provide you with exceptional heart care, we have created designated Provider Care Teams.  These Care Teams include your primary Cardiologist (physician) and Advanced Practice Providers (APPs -  Physician Assistants and Nurse Practitioners) who all work together to provide you with the care you need, when you need it.  We recommend signing up for the patient portal called "MyChart".  Sign up information is provided on this After Visit Summary.  MyChart is used to connect with patients for Virtual Visits (Telemedicine).  Patients are able to view lab/test results, encounter notes, upcoming appointments, etc.  Non-urgent messages can be sent to your provider as well.   To learn more about what you can do with MyChart, go to [email or web address].    Your next appointment:   6 month(s)  The format for your next appointment:   In Person  Provider:   Sinclair Grooms, MD     Other Instructions  If your blood pressure stays consistently X 3 reading above [phone] systolic ( the top number) call office.

## 2021-08-10 ENCOUNTER — Other Ambulatory Visit: Payer: Self-pay | Admitting: Interventional Cardiology

## 2021-08-17 DIAGNOSIS — H524 Presbyopia: Secondary | ICD-10-CM | POA: Diagnosis not present

## 2021-08-17 DIAGNOSIS — H2513 Age-related nuclear cataract, bilateral: Secondary | ICD-10-CM | POA: Diagnosis not present

## 2021-08-17 DIAGNOSIS — H52203 Unspecified astigmatism, bilateral: Secondary | ICD-10-CM | POA: Diagnosis not present

## 2021-08-24 ENCOUNTER — Other Ambulatory Visit: Payer: Self-pay | Admitting: Interventional Cardiology

## 2021-09-08 ENCOUNTER — Other Ambulatory Visit: Payer: Medicare Other | Admitting: *Deleted

## 2021-09-08 ENCOUNTER — Other Ambulatory Visit: Payer: Self-pay

## 2021-09-08 DIAGNOSIS — E782 Mixed hyperlipidemia: Secondary | ICD-10-CM | POA: Diagnosis not present

## 2021-09-08 DIAGNOSIS — Z79899 Other long term (current) drug therapy: Secondary | ICD-10-CM

## 2021-09-08 DIAGNOSIS — I493 Ventricular premature depolarization: Secondary | ICD-10-CM | POA: Diagnosis not present

## 2021-09-08 DIAGNOSIS — I10 Essential (primary) hypertension: Secondary | ICD-10-CM

## 2021-09-08 DIAGNOSIS — I2510 Atherosclerotic heart disease of native coronary artery without angina pectoris: Secondary | ICD-10-CM

## 2021-09-08 LAB — HEPATIC FUNCTION PANEL
ALT: 26 IU/L (ref 0–44)
AST: 22 IU/L (ref 0–40)
Albumin: 4.4 g/dL (ref 3.7–4.7)
Alkaline Phosphatase: 51 IU/L (ref 44–121)
Bilirubin Total: 0.4 mg/dL (ref 0.0–1.2)
Bilirubin, Direct: 0.1 mg/dL (ref 0.00–0.40)
Total Protein: 6.9 g/dL (ref 6.0–8.5)

## 2021-09-08 LAB — LIPID PANEL
Chol/HDL Ratio: 4.1 ratio (ref 0.0–5.0)
Cholesterol, Total: 159 mg/dL (ref 100–199)
HDL: 39 mg/dL — ABNORMAL LOW (ref >39)
LDL Chol Calc (NIH): 95 mg/dL (ref 0–99)
Triglycerides: 141 mg/dL (ref 0–149)
VLDL Cholesterol Cal: 25 mg/dL (ref 5–40)

## 2021-11-17 NOTE — Progress Notes (Signed)
Cardiology Office Note:    Date:  11/19/2021   ID:  Franklin Ward, DOB [birth year], MRN [id number]  PCP:  Shirline Frees, MD  Cardiologist:  Sinclair Grooms, MD   Referring MD: Shirline Frees, MD   No chief complaint on file.   History of Present Illness:    Franklin Ward is a 76 y.o. 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    He has put forth a concerted effort to lose weight and to eat better.  He is not able to take statins.  He wants to see if his cholesterol levels are better after 20 pound weight loss.  He is staying active.  He has continued pain in the left leg.  He thought it was all due to statin therapy but he clearly has some mechanical or inflammatory issue in the leg.  Denies angina, dyspnea, edema, and palpitations.  Past Medical History:  Diagnosis Date   Coronary artery disease    Diverticulitis     Past Surgical History:  Procedure Laterality Date   CORONARY STENT INTERVENTION  08/10/2018   CORONARY STENT INTERVENTION N/A 08/10/2018   Procedure: CORONARY STENT INTERVENTION;  Surgeon: Martinique, Peter M, MD;  Location: [place] [facility];  Service: Cardiovascular;  Laterality: N/A;   LEFT HEART CATH AND CORONARY ANGIOGRAPHY N/A 08/10/2018   Procedure: LEFT HEART CATH AND CORONARY ANGIOGRAPHY;  Surgeon: Martinique, Peter M, MD;  Location: [place] [facility];  Service: Cardiovascular;  Laterality: N/A;    Current Medications: Current Meds  Medication Sig   amiodarone (PACERONE) 200 MG tablet Take one tablet by mouth daily Monday through Friday.  Do NOT take on Saturday or Sunday.   Ascorbic Acid (VITAMIN C PO) Take 1,000 mg by mouth daily.   Calcium Carb-Cholecalciferol (CALCIUM 600 +  D PO) Take 1 tablet by mouth daily.    carboxymethylcellulose (REFRESH PLUS) 0.5 % SOLN Place 1 drop into both eyes daily as needed (dry eyes).   cholecalciferol (VITAMIN D3) 25 MCG (1000 UT) tablet Take 5,000 Units by mouth daily.    clopidogrel (PLAVIX) 75 MG tablet Take 1 tablet (75 mg total) by mouth daily.   Cyanocobalamin (B-12) 2500 MCG TABS Take 1 tablet by mouth daily.    metoprolol tartrate (LOPRESSOR) 50 MG tablet TAKE ONE TABLET BY MOUTH TWICE DAILY   Misc Natural Products (OSTEO BI-FLEX ADV TRIPLE ST PO) Take 1 tablet by mouth 2 (two) times daily.    Multiple Vitamin (MULTIVITAMIN) tablet Take 1 tablet by mouth daily.   Psyllium (METAMUCIL FIBER PO) Take by mouth See admin instructions. Mix 1 teaspoonful of powder into a glass of water and drink two to three times a day   Turmeric 500 MG CAPS Take 500 mg by mouth daily.    zinc gluconate 50 MG tablet Take 50 mg by mouth daily.   Current Facility-Administered Medications for the 11/19/21 encounter (Office Visit) with Belva Crome, MD  Medication   0.9 %  sodium chloride infusion     Allergies:   Bee venom, Erythromycin, and Tetracyclines & related   Social History   Socioeconomic History   Marital status: Single    Spouse name: Not on file  Number of children: Not on file   Years of education: Not on file   Highest education level: Not on file  Occupational History   Not on file  Tobacco Use   Smoking status: Former   Smokeless tobacco: Former    Types: Nurse, children's Use: Never used  Substance and Sexual Activity   Alcohol use: No   Drug use: No   Sexual activity: Not on file  Other Topics Concern   Not on file  Social History Narrative   Not on file   Social Determinants of Health   Financial Resource Strain: Not on file  Food Insecurity: Not on file  Transportation Needs: Not on file  Physical Activity: Not on file  Stress: Not on file  Social Connections: Not on file     Family  History: The patient's family history includes Lung cancer in his father. There is no history of Colon cancer.  ROS:   Please see the history of present illness.    No medication side effects.  Trying to lose more weight.  All other systems reviewed and are negative.  EKGs/Labs/Other Studies Reviewed:    The following studies were reviewed today:  LONG TERM MONITOR 01/25/2021: Study Highlights  1. NSR with sinus bradycardia 2. Rare PAC's 3. Occaisional PVC's 4. No VT or SVT 5. No atrial fib 6. No prolonged pauses.   [name],MD   Patch Wear Time:  3 days and 3 hours (2022-07-26T10:52:56-0400 to 2022-07-29T14:28:07-398)   Patient had a min HR of 47 bpm, max HR of 91 bpm, and avg HR of 60 bpm. Predominant underlying rhythm was Sinus Rhythm. First Degree AV Block was present. Isolated SVEs were rare (<1.0%), SVE Couplets were rare (<1.0%), and no SVE Triplets were present.  Isolated VEs were occasional (3.4%, 8779), and no VE Couplets or VE Triplets were present. Ventricular Bigeminy and Trigeminy were present.  CONTINUOUS MONITOR 05/06/2020 Study Highlights  NSR Frequent PVC's, with 24% burden and no associated symptoms.  EKG:  EKG not repeated.  Recent Labs: 01/13/2021: TSH 1.860 09/08/2021: ALT 26  Recent Lipid Panel    Component Value Date/Time   CHOL 159 09/08/2021 0857   TRIG 141 09/08/2021 0857   HDL 39 (L) 09/08/2021 0857   CHOLHDL 4.1 09/08/2021 0857   CHOLHDL 5.4 08/10/2018 0403   VLDL 15 08/10/2018 0403   LDLCALC 95 09/08/2021 0857    Physical Exam:    VS:  BP 132/60   Pulse (!) 57   Ht '5\' 8"'$  (1.727 m)   Wt 192 lb (87.1 kg)   SpO2 97%   BMI 29.19 kg/m     Wt Readings from Last 3 Encounters:  11/19/21 192 lb (87.1 kg)  05/27/21 211 lb 3.2 oz (95.8 kg)  01/13/21 214 lb 6.4 oz (97.3 kg)     GEN: Weight is down 20 pounds since last visit.. No acute distress HEENT: Normal NECK: No JVD. LYMPHATICS: No lymphadenopathy CARDIAC: No murmur. RRR no  gallop, or edema. VASCULAR:  Normal Pulses. No bruits. RESPIRATORY:  Clear to auscultation without rales, wheezing or rhonchi  ABDOMEN: Soft, non-tender, non-distended, No pulsatile mass, MUSCULOSKELETAL: No deformity  SKIN: Warm and dry NEUROLOGIC:  Alert and oriented x 3 PSYCHIATRIC:  Normal affect   ASSESSMENT:    1. Coronary artery disease involving native coronary artery of native heart without angina pectoris   2. On amiodarone therapy   3. Benign essential HTN   4. Mixed hyperlipidemia  5. Ventricular premature beats   6. Diabetes mellitus screening    PLAN:    In order of problems listed above:  He is not on statin therapy.  Most recent LDL was 95.  Target is less than 55.  He has lost 20 pounds, he is dieting, and exercising.  We will do a lipid panel and LP(a) today. Continue amiodarone 200 mg 5 days a week.  We will need to monitor for ongoing development of toxicity. Blood pressure control is excellent with weight loss. LDL today.  Medication may require injectable if we are not reaching target LDL of at least 70 or less. No premature beats heard on auscultation.  This is being followed by Dr. Lovena Le. Hemoglobin A1c was 5.8 when last checked.   Overall education and awareness concerning primary/secondary risk prevention was discussed in detail: LDL less than 70, hemoglobin A1c less than 7, blood pressure target less than 130/80 mmHg, >150 minutes of moderate aerobic activity per week, avoidance of smoking, weight control (via diet and exercise), and continued surveillance/management of/for obstructive sleep apnea.    Medication Adjustments/Labs and Tests Ordered: Current medicines are reviewed at length with the patient today.  Concerns regarding medicines are outlined above.  Orders Placed This Encounter  Procedures   Lipid panel   Lipoprotein A (LPA)   Hemoglobin A1c   No orders of the defined types were placed in this encounter.   Patient Instructions   Medication Instructions:  Your physician recommends that you continue on your current medications as directed. Please refer to the Current Medication list given to you today.  *If you need a refill on your cardiac medications before your next appointment, please call your pharmacy*  Lab Work: TODAY: Lipid panel, LP(a), Hgb a1c If you have labs (blood work) drawn today and your tests are completely normal, you will receive your results only by: [place] (if you have MyChart) OR A paper copy in the mail If you have any lab test that is abnormal or we need to change your treatment, we will call you to review the results.  Testing/Procedures: NONE  Follow-Up: At [facility], you and your health needs are our priority.  As part of our continuing mission to provide you with exceptional heart care, we have created designated Provider Care Teams.  These Care Teams include your primary Cardiologist (physician) and Advanced Practice Providers (APPs -  Physician Assistants and Nurse Practitioners) who all work together to provide you with the care you need, when you need it.  Your next appointment:   1 year(s)  The format for your next appointment:   In Person  Provider:   Sinclair Grooms, MD {   Important Information About Sugar         Signed, Sinclair Grooms, MD  11/19/2021 11:25 AM    [place]

## 2021-11-19 ENCOUNTER — Encounter: Payer: Self-pay | Admitting: Interventional Cardiology

## 2021-11-19 ENCOUNTER — Ambulatory Visit: Payer: Medicare Other | Admitting: Interventional Cardiology

## 2021-11-19 VITALS — BP 132/60 | HR 57 | Ht 68.0 in | Wt 192.0 lb

## 2021-11-19 DIAGNOSIS — I10 Essential (primary) hypertension: Secondary | ICD-10-CM

## 2021-11-19 DIAGNOSIS — Z131 Encounter for screening for diabetes mellitus: Secondary | ICD-10-CM | POA: Diagnosis not present

## 2021-11-19 DIAGNOSIS — I493 Ventricular premature depolarization: Secondary | ICD-10-CM

## 2021-11-19 DIAGNOSIS — I2510 Atherosclerotic heart disease of native coronary artery without angina pectoris: Secondary | ICD-10-CM | POA: Diagnosis not present

## 2021-11-19 DIAGNOSIS — E782 Mixed hyperlipidemia: Secondary | ICD-10-CM | POA: Diagnosis not present

## 2021-11-19 DIAGNOSIS — Z79899 Other long term (current) drug therapy: Secondary | ICD-10-CM

## 2021-11-19 NOTE — Patient Instructions (Signed)
Medication Instructions:  Your physician recommends that you continue on your current medications as directed. Please refer to the Current Medication list given to you today.  *If you need a refill on your cardiac medications before your next appointment, please call your pharmacy*  Lab Work: TODAY: Lipid panel, LP(a), Hgb a1c If you have labs (blood work) drawn today and your tests are completely normal, you will receive your results only by: [place] (if you have MyChart) OR A paper copy in the mail If you have any lab test that is abnormal or we need to change your treatment, we will call you to review the results.  Testing/Procedures: NONE  Follow-Up: At [facility], you and your health needs are our priority.  As part of our continuing mission to provide you with exceptional heart care, we have created designated Provider Care Teams.  These Care Teams include your primary Cardiologist (physician) and Advanced Practice Providers (APPs -  Physician Assistants and Nurse Practitioners) who all work together to provide you with the care you need, when you need it.  Your next appointment:   1 year(s)  The format for your next appointment:   In Person  Provider:   Sinclair Grooms, MD {   Important Information About Sugar

## 2021-11-20 LAB — LIPOPROTEIN A (LPA): Lipoprotein (a): 72.6 nmol/L (ref <75.0)

## 2021-11-20 LAB — LIPID PANEL
Chol/HDL Ratio: 2.6 ratio (ref 0.0–5.0)
Cholesterol, Total: 93 mg/dL — ABNORMAL LOW (ref 100–199)
HDL: 36 mg/dL — ABNORMAL LOW (ref >39)
LDL Chol Calc (NIH): 39 mg/dL (ref 0–99)
Triglycerides: 88 mg/dL (ref 0–149)
VLDL Cholesterol Cal: 18 mg/dL (ref 5–40)

## 2021-11-20 LAB — HEMOGLOBIN A1C
Est. average glucose Bld gHb Est-mCnc: 117 mg/dL
Hgb A1c MFr Bld: 5.7 % — ABNORMAL HIGH (ref 4.8–5.6)

## 2022-01-07 ENCOUNTER — Encounter: Payer: Self-pay | Admitting: Internal Medicine

## 2022-02-12 ENCOUNTER — Other Ambulatory Visit: Payer: Self-pay | Admitting: Internal Medicine

## 2022-03-10 ENCOUNTER — Telehealth: Payer: Self-pay | Admitting: *Deleted

## 2022-03-10 NOTE — Telephone Encounter (Signed)
   Pre-operative Risk Assessment    Patient Name: Franklin Ward  DOB: Dec 05, 1945 MRN: [id number]     Request for Surgical Clearance    Procedure:  Dental Extraction - Amount of Teeth to be Pulled:  18 TEETH AND PLACEMENT OF DENTAL IMPLANTS  Date of Surgery:  Clearance TBD                                 Surgeon:  Ray Church, MD Surgeon's Group or Practice Name:  [facility] Phone number:  [phone] Fax number:  [phone]   Type of Clearance Requested:   - Medical   PT IS ON PLAVIX BUT ON CLEARANCE IT STATES PT CAN CONTINUE.  I CALLED AND SPOKE WITH THE OFFICE AND THEY CONFIRMED PT CAN CONTINUE PLAVIX IF NEEDED    Type of Anesthesia:  General    Additional requests/questions:    Signed, Jeanann Lewandowsky   03/10/2022, 12:42 PM

## 2022-03-16 NOTE — Telephone Encounter (Signed)
   Name: Franklin Ward  DOB: 1946-03-19  MRN: [id number]   Primary Cardiologist: Sinclair Grooms, MD  Chart reviewed as part of pre-operative protocol coverage. Patient was contacted 03/16/2022 in reference to pre-operative risk assessment for pending surgery as outlined below.    Franklin Ward was last seen on 11/19/21 by Dr. Tamala Julian.    Left VM on home phone per [facility] requesting call back. So long as no new anginal symptoms, would be reasonable to proceed  Given hx of CAD, ideally would remain on Plavix during the peri-procedural period.   Loel Dubonnet, NP 03/16/2022, 4:28 PM

## 2022-03-22 NOTE — Telephone Encounter (Signed)
Attempted to call patient. Messages left for call back on home and cell numbers.  Emmaline Life, NP-C  03/22/2022, 3:45 PM [address] Office [phone] Fax [phone]

## 2022-03-23 NOTE — Telephone Encounter (Signed)
   Primary Cardiologist: Sinclair Grooms, MD  Chart reviewed as part of pre-operative protocol coverage. Given past medical history and time since last visit, based on ACC/AHA guidelines, Roc R Boal would be at acceptable risk for the planned procedure without further cardiovascular testing.   Patient was advised that if he develops new symptoms prior to surgery to contact our office to arrange a follow-up appointment.  He verbalized understanding.  I will route this recommendation to the requesting party via Epic fax function and remove from pre-op pool.  Please call with questions.  Emmaline Life, NP-C  03/23/2022, 8:28 AM [address], [address] Office [phone] Fax [phone]

## 2022-04-26 DIAGNOSIS — Z23 Encounter for immunization: Secondary | ICD-10-CM | POA: Diagnosis not present

## 2022-04-26 DIAGNOSIS — I10 Essential (primary) hypertension: Secondary | ICD-10-CM | POA: Diagnosis not present

## 2022-04-26 DIAGNOSIS — E7800 Pure hypercholesterolemia, unspecified: Secondary | ICD-10-CM | POA: Diagnosis not present

## 2022-04-26 DIAGNOSIS — R7303 Prediabetes: Secondary | ICD-10-CM | POA: Diagnosis not present

## 2022-04-26 DIAGNOSIS — I2510 Atherosclerotic heart disease of native coronary artery without angina pectoris: Secondary | ICD-10-CM | POA: Diagnosis not present

## 2022-05-21 ENCOUNTER — Encounter: Payer: Self-pay | Admitting: Internal Medicine

## 2022-05-21 ENCOUNTER — Ambulatory Visit: Payer: Medicare Other | Attending: Internal Medicine | Admitting: Internal Medicine

## 2022-05-21 VITALS — BP 146/74 | HR 54 | Ht 68.0 in | Wt 185.4 lb

## 2022-05-21 DIAGNOSIS — I493 Ventricular premature depolarization: Secondary | ICD-10-CM

## 2022-05-21 DIAGNOSIS — I10 Essential (primary) hypertension: Secondary | ICD-10-CM | POA: Diagnosis not present

## 2022-05-21 NOTE — Progress Notes (Signed)
HPI  Mr. Stines returns today for followup his symptomatic PVC's. He is a pleasant 76 yo man with CAD, s/p NSTEMI who developed severe fatigue associated with PVC's and was started on amiodarone. He has done well in the interim. He c/o feeling hot a lot. He denies chest pain or sob. No edema. No syncope.      Current Outpatient Medications  Medication Sig Dispense Refill   amiodarone (PACERONE) 200 MG tablet TAKE ONE TABLET BY MOUTH DAILY MONDAY - FRIDAY (DO NOT TAKE SATURDAY OR SUNDAY) 90 tablet 3   Ascorbic Acid (VITAMIN C PO) Take 1,000 mg by mouth daily.     Calcium Carb-Cholecalciferol (CALCIUM 600 + D PO) Take 1 tablet by mouth daily.      carboxymethylcellulose (REFRESH PLUS) 0.5 % SOLN Place 1 drop into both eyes daily as needed (dry eyes).     cholecalciferol (VITAMIN D3) 25 MCG (1000 UT) tablet Take 5,000 Units by mouth daily.      clopidogrel (PLAVIX) 75 MG tablet Take 1 tablet (75 mg total) by mouth daily. 90 tablet 3   Cyanocobalamin (B-12) 2500 MCG TABS Take 1 tablet by mouth daily.      metoprolol tartrate (LOPRESSOR) 50 MG tablet TAKE ONE TABLET BY MOUTH TWICE DAILY 180 tablet 2   Misc Natural Products (OSTEO BI-FLEX ADV TRIPLE ST PO) Take 1 tablet by mouth 2 (two) times daily.      Multiple Vitamin (MULTIVITAMIN) tablet Take 1 tablet by mouth daily.     Psyllium (METAMUCIL FIBER PO) Take by mouth See admin instructions. Mix 1 teaspoonful of powder into a glass of water and drink two to three times a day     Turmeric 500 MG CAPS Take 500 mg by mouth daily.      zinc gluconate 50 MG tablet Take 50 mg by mouth daily.     ezetimibe (ZETIA) 10 MG tablet Take 1 tablet (10 mg total) by mouth daily. 90 tablet 3   nitroGLYCERIN (NITROSTAT) 0.3 MG SL tablet Place 1 tablet (0.3 mg total) under the tongue every 5 (five) minutes as needed for chest pain. 30 tablet 3   Current Facility-Administered Medications  Medication Dose Route Frequency Provider Last Rate Last Admin    0.9 %  sodium chloride infusion  500 mL Intravenous Continuous Irene Shipper, MD         Past Medical History:  Diagnosis Date   Coronary artery disease    Diverticulitis     ROS:   All systems reviewed and negative except as noted in the HPI.   Past Surgical History:  Procedure Laterality Date   CORONARY STENT INTERVENTION  08/10/2018   CORONARY STENT INTERVENTION N/A 08/10/2018   Procedure: CORONARY STENT INTERVENTION;  Surgeon: Martinique, Peter M, MD;  Location: [place] [facility];  Service: Cardiovascular;  Laterality: N/A;   LEFT HEART CATH AND CORONARY ANGIOGRAPHY N/A 08/10/2018   Procedure: LEFT HEART CATH AND CORONARY ANGIOGRAPHY;  Surgeon: Martinique, Peter M, MD;  Location: [place] [facility];  Service: Cardiovascular;  Laterality: N/A;     Family History  Problem Relation Age of Onset   Lung cancer Father    Colon cancer Neg Hx      Social History   Socioeconomic History   Marital status: Single    Spouse name: Not on file   Number of children: Not on file   Years of education: Not on file   Highest education level: Not  on file  Occupational History   Not on file  Tobacco Use   Smoking status: Former   Smokeless tobacco: Former    Types: Nurse, children's Use: Never used  Substance and Sexual Activity   Alcohol use: No   Drug use: No   Sexual activity: Not on file  Other Topics Concern   Not on file  Social History Narrative   Not on file   Social Determinants of Health   Financial Resource Strain: Not on file  Food Insecurity: Not on file  Transportation Needs: Not on file  Physical Activity: Not on file  Stress: Not on file  Social Connections: Not on file  Intimate Partner Violence: Not on file     BP (!) 146/74   Pulse (!) 54   Ht '5\' 8"'$  (1.727 m)   Wt 185 lb 6.4 oz (84.1 kg)   SpO2 97%   BMI 28.19 kg/m   Physical Exam:  Well appearing NAD HEENT: Unremarkable Neck:  No JVD, no thyromegally Lymphatics:  No adenopathy Back:   No CVA tenderness Lungs:  Clear HEART:  Regular rate rhythm, no murmurs, no rubs, no clicks Abd:  soft, positive bowel sounds, no organomegally, no rebound, no guarding Ext:  2 plus pulses, no edema, no cyanosis, no clubbing Skin:  No rashes no nodules Neuro:  CN II through XII intact, motor grossly intact  EKG  DEVICE  Normal device function.  See PaceArt for details.   Assess/Plan: 1. PVC's - I recommend he continues his amiodarone at the current dose. His energy level has improved. He will check a 3 day zio monitor and I would anticipate reducing his dose of amiodarone.  2. CAD - he denies anginal symptoms. 3. HTN - his bp is controlled. He is encouraged to remain active. 4. Dyslipidemia - he will continue lipitor.     Carleene Overlie Lerline Valdivia,MD

## 2022-05-21 NOTE — Patient Instructions (Signed)

## 2022-06-07 ENCOUNTER — Other Ambulatory Visit: Payer: Self-pay | Admitting: Internal Medicine

## 2022-08-23 ENCOUNTER — Other Ambulatory Visit: Payer: Self-pay

## 2022-08-23 MED ORDER — CLOPIDOGREL BISULFATE 75 MG PO TABS: 75.0000 mg | ORAL_TABLET | Freq: Every day | ORAL | 1 refills | Status: DC

## 2022-12-15 ENCOUNTER — Other Ambulatory Visit: Payer: Self-pay | Admitting: *Deleted

## 2022-12-15 MED ORDER — METOPROLOL TARTRATE 50 MG PO TABS: 50.0000 mg | ORAL_TABLET | Freq: Two times a day (BID) | ORAL | 3 refills | Status: DC

## 2023-02-15 ENCOUNTER — Other Ambulatory Visit: Payer: Self-pay | Admitting: Internal Medicine

## 2023-02-15 ENCOUNTER — Other Ambulatory Visit: Payer: Self-pay | Admitting: Nurse Practitioner

## 2023-02-15 MED ORDER — AMIODARONE HCL 200 MG PO TABS: ORAL_TABLET | ORAL | 1 refills | Status: DC

## 2023-02-15 NOTE — Telephone Encounter (Signed)
Pt's medication was sent to pt's pharmacy as requested. Confirmation received.  °

## 2023-02-18 ENCOUNTER — Encounter: Payer: Self-pay | Admitting: Cardiology

## 2023-02-18 ENCOUNTER — Ambulatory Visit: Payer: Medicare Other | Attending: Cardiology | Admitting: Cardiology

## 2023-02-18 VITALS — BP 122/72 | HR 53 | Ht 68.0 in | Wt 180.0 lb

## 2023-02-18 DIAGNOSIS — I2510 Atherosclerotic heart disease of native coronary artery without angina pectoris: Secondary | ICD-10-CM | POA: Diagnosis not present

## 2023-02-18 DIAGNOSIS — I10 Essential (primary) hypertension: Secondary | ICD-10-CM

## 2023-02-18 DIAGNOSIS — Z79899 Other long term (current) drug therapy: Secondary | ICD-10-CM | POA: Diagnosis not present

## 2023-02-18 DIAGNOSIS — E782 Mixed hyperlipidemia: Secondary | ICD-10-CM

## 2023-02-18 NOTE — Progress Notes (Signed)
  Cardiology Office Note:  .   Date:  02/18/2023  ID:  Franklin Ward, DOB 1945/08/01, MRN [id number] PCP: Franklin Retort, MD  [facility] Providers Cardiologist:  Donato Schultz, MD    History of Present Illness: .   Franklin Ward is a 77 y.o. male Discussed the use of AI scribe software for clinical note transcription with the patient, who gave verbal consent to proceed.  History of Present Illness   The patient, with a history of coronary artery disease, NSTEMI 2020 RCA stents x2 and premature ventricular contractions on Amio, Dr. Ladona Ridgel, presents for a routine follow-up. He reports excessive sweating, which he attributes to his current medication, amiodarone. The patient notes that he has always perspired a great deal, but it seems to have increased since starting the medication. He also mentions a sensitivity to both direct and indirect sunlight.  The patient's coronary artery disease was managed with the placement of two stents in the right coronary artery in February 2020. There is residual disease that is being managed medically. The patient's premature ventricular contractions have been well controlled with amiodarone, with the patient reporting a significant reduction in symptoms.  The patient also reports a history of muscle weakness and shaking in the legs, which he attributes to previous use of atorvastatin. He discontinued the medication on his own and has since seen an improvement in these symptoms. The patient's cholesterol is currently managed with ezetimibe and a supplement of CoQ10 with red yeast rice. The patient reports a significant reduction in his LDL cholesterol since starting this regimen.       ROS: No CP, no SOB  Studies Reviewed: Marland Kitchen        LABS ALT: 26 (08/2021) LDL: 39 (11/19/2021) LP(a): 72 Hb: 14.0 TSH: 1.8  ECHO 2018: EF 65%  Risk Assessment/Calculations:            Physical Exam:   VS:  BP 122/72   Pulse (!) 53   Ht 5\' 8"  (1.727 m)    Wt 180 lb (81.6 kg)   SpO2 95%   BMI 27.37 kg/m    Wt Readings from Last 3 Encounters:  02/18/23 180 lb (81.6 kg)  05/21/22 185 lb 6.4 oz (84.1 kg)  11/19/21 192 lb (87.1 kg)    GEN: Well nourished, well developed in no acute distress NECK: No JVD; No carotid bruits CARDIAC: No ectopy RRR, no murmurs, rubs, gallops RESPIRATORY:  Clear to auscultation without rales, wheezing or rhonchi  ABDOMEN: Soft, non-tender, non-distended EXTREMITIES:  No edema; No deformity   ASSESSMENT AND PLAN: .    Assessment and Plan    Coronary Artery Disease History of myocardial infarction in February 2020 with two stents placed in the right coronary artery. Residual disease managed medically. No current symptoms of chest pain or shortness of breath. -Continue current medical management.  Premature Ventricular Contractions Previously symptomatic, now well controlled on Amiodarone. PVC now 3.4 % on ZIO 01/25/21. Noted increased perspiration, a known side effect of Amiodarone. -Monitor thyroid and liver function today due to Amiodarone use.  Hyperlipidemia LDL controlled to 39 as of June 2023 with Ezetimibe 10mg  and dietary changes. Patient also taking CoQ10 with red yeast rice supplement. -Check lipid panel today.  Follow-up in 1 year or sooner if any issues arise.              Signed, Donato Schultz, MD

## 2023-02-18 NOTE — Patient Instructions (Signed)
Medication Instructions:  The current medical regimen is effective;  continue present plan and medications.  *If you need a refill on your cardiac medications before your next appointment, please call your pharmacy*   Lab Work: Please have  blood work today (Lipid, CBC, CMP, TSH and Free T4)  If you have labs (blood work) drawn today and your tests are completely normal, you will receive your results only by: MyChart Message (if you have MyChart) OR A paper copy in the mail If you have any lab test that is abnormal or we need to change your treatment, we will call you to review the results.   Follow-Up: At [facility], you and your health needs are our priority.  As part of our continuing mission to provide you with exceptional heart care, we have created designated Provider Care Teams.  These Care Teams include your primary Cardiologist (physician) and Advanced Practice Providers (APPs -  Physician Assistants and Nurse Practitioners) who all work together to provide you with the care you need, when you need it.  We recommend signing up for the patient portal called "MyChart".  Sign up information is provided on this After Visit Summary.  MyChart is used to connect with patients for Virtual Visits (Telemedicine).  Patients are able to view lab/test results, encounter notes, upcoming appointments, etc.  Non-urgent messages can be sent to your provider as well.   To learn more about what you can do with MyChart, go to [email or web address].    Your next appointment:   1 year(s)  Provider:   Dr Donato Schultz

## 2023-02-19 LAB — COMPREHENSIVE METABOLIC PANEL
ALT: 17 IU/L (ref 0–44)
AST: 21 IU/L (ref 0–40)
Albumin: 4.1 g/dL (ref 3.8–4.8)
Alkaline Phosphatase: 48 IU/L (ref 44–121)
BUN/Creatinine Ratio: 18 (ref 10–24)
BUN: 15 mg/dL (ref 8–27)
Bilirubin Total: 0.3 mg/dL (ref 0.0–1.2)
CO2: 23 mmol/L (ref 20–29)
Calcium: 9.6 mg/dL (ref 8.6–10.2)
Chloride: 105 mmol/L (ref 96–106)
Creatinine, Ser: 0.85 mg/dL (ref 0.76–1.27)
Globulin, Total: 2.4 g/dL (ref 1.5–4.5)
Glucose: 83 mg/dL (ref 70–99)
Potassium: 4.8 mmol/L (ref 3.5–5.2)
Sodium: 139 mmol/L (ref 134–144)
Total Protein: 6.5 g/dL (ref 6.0–8.5)
eGFR: 89 mL/min/{1.73_m2} (ref >59)

## 2023-02-19 LAB — TSH: TSH: 2.19 u[IU]/mL (ref 0.450–4.500)

## 2023-02-19 LAB — CBC
Hematocrit: 43.3 % (ref 37.5–51.0)
Hemoglobin: 14.2 g/dL (ref 13.0–17.7)
MCH: 30.9 pg (ref 26.6–33.0)
MCHC: 32.8 g/dL (ref 31.5–35.7)
MCV: 94 fL (ref 79–97)
Platelets: 243 10*3/uL (ref 150–450)
RBC: 4.59 x10E6/uL (ref 4.14–5.80)
RDW: 12.9 % (ref 11.6–15.4)
WBC: 8.6 10*3/uL (ref 3.4–10.8)

## 2023-02-19 LAB — T4, FREE: Free T4: 1.54 ng/dL (ref 0.82–1.77)

## 2023-02-19 LAB — LIPID PANEL
Chol/HDL Ratio: 3.2 ratio (ref 0.0–5.0)
Cholesterol, Total: 113 mg/dL (ref 100–199)
HDL: 35 mg/dL — ABNORMAL LOW (ref >39)
LDL Chol Calc (NIH): 56 mg/dL (ref 0–99)
Triglycerides: 120 mg/dL (ref 0–149)
VLDL Cholesterol Cal: 22 mg/dL (ref 5–40)

## 2023-05-14 ENCOUNTER — Other Ambulatory Visit: Payer: Self-pay | Admitting: Cardiology

## 2023-06-17 ENCOUNTER — Other Ambulatory Visit: Payer: Self-pay | Admitting: Internal Medicine

## 2023-07-08 ENCOUNTER — Encounter: Payer: Self-pay | Admitting: Internal Medicine

## 2023-07-08 ENCOUNTER — Ambulatory Visit: Payer: Medicare Other | Attending: Internal Medicine | Admitting: Internal Medicine

## 2023-07-08 VITALS — BP 144/80 | HR 59 | Ht 68.0 in | Wt 178.8 lb

## 2023-07-08 DIAGNOSIS — I493 Ventricular premature depolarization: Secondary | ICD-10-CM

## 2023-07-08 DIAGNOSIS — Z79899 Other long term (current) drug therapy: Secondary | ICD-10-CM | POA: Diagnosis not present

## 2023-07-08 NOTE — Progress Notes (Signed)
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 He is a pleasant 78 yo man with CAD, s/p NSTEMI who developed severe fatigue associated with PVC's and was started on amiodarone. He has done well in the interim. He c/o feeling hot a lot. He denies chest pain or sob. No edema. No syncope.  Allergies  Allergen Reactions   Bee Venom Hives   Erythromycin Other (See Comments)   Tetracyclines & Related Rash     Current Outpatient Medications  Medication Sig Dispense Refill   amiodarone (PACERONE) 200 MG tablet TAKE ONE TABLET BY MOUTH DAILY MONDAY - FRIDAY (DO NOT TAKE SATURDAY OR SUNDAY) 90 tablet 1   Ascorbic Acid (VITAMIN C PO) Take 1,000 mg by mouth daily.     Calcium Carb-Cholecalciferol (CALCIUM 600 + D PO) Take 1 tablet by mouth daily.      carboxymethylcellulose (REFRESH PLUS) 0.5 % SOLN Place 1 drop into both eyes daily as needed (dry eyes).     cholecalciferol (VITAMIN D3) 25 MCG (1000 UT) tablet Take 5,000 Units by mouth daily.      clopidogrel (PLAVIX) 75 MG tablet TAKE ONE TABLET BY MOUTH DAILY 90 tablet 2   Cyanocobalamin (B-12) 2500 MCG TABS Take 1 tablet by mouth daily.      ezetimibe (ZETIA) 10 MG tablet TAKE ONE TABLET BY MOUTH DAILY 90 tablet 3   metoprolol tartrate (LOPRESSOR) 50 MG tablet Take 1 tablet (50 mg total) by mouth 2 (two) times daily. 180 tablet 3   Misc Natural Products (OSTEO BI-FLEX ADV TRIPLE ST PO) Take 1 tablet by mouth 2 (two) times daily.      Multiple Vitamin (MULTIVITAMIN) tablet Take 1 tablet by mouth daily.     Psyllium (METAMUCIL FIBER PO) Take by mouth See admin instructions. Mix 1 teaspoonful of powder into a glass of water and drink two to three times a day     Turmeric 500 MG CAPS Take 500 mg by mouth daily.      zinc gluconate 50 MG tablet Take 50 mg by mouth daily.     nitroGLYCERIN (NITROSTAT) 0.3 MG SL tablet Place 1 tablet (0.3 mg total) under the tongue every 5 (five) minutes as needed for chest pain. 30 tablet 3    Current Facility-Administered Medications  Medication Dose Route Frequency Provider Last Rate Last Admin   0.9 %  sodium chloride infusion  500 mL Intravenous Continuous Hilarie Fredrickson, MD         Past Medical History:  Diagnosis Date   Coronary artery disease    Diverticulitis     ROS:   All systems reviewed and negative except as noted in the HPI.   Past Surgical History:  Procedure Laterality Date   CORONARY STENT INTERVENTION  08/10/2018   CORONARY STENT INTERVENTION N/A 08/10/2018   Procedure: CORONARY STENT INTERVENTION;  Surgeon: Swaziland, Peter M, MD;  Location: [facility] INVASIVE CV LAB;  Service: Cardiovascular;  Laterality: N/A;   LEFT HEART CATH AND CORONARY ANGIOGRAPHY N/A 08/10/2018   Procedure: LEFT HEART CATH AND CORONARY ANGIOGRAPHY;  Surgeon: Swaziland, Peter M, MD;  Location: [facility] INVASIVE CV LAB;  Service: Cardiovascular;  Laterality: N/A;     Family History  Problem Relation Age of Onset   Lung cancer Father    Colon cancer Neg Hx      Social History   Socioeconomic History   Marital status: Single    Spouse name: Not on file   Number  of children: Not on file   Years of education: Not on file   Highest education level: Not on file  Occupational History   Not on file  Tobacco Use   Smoking status: Former   Smokeless tobacco: Former    Types: Designer, multimedia Use   Vaping status: Never Used  Substance and Sexual Activity   Alcohol use: No   Drug use: No   Sexual activity: Not on file  Other Topics Concern   Not on file  Social History Narrative   Not on file   Social Drivers of Health   Financial Resource Strain: Not on file  Food Insecurity: Not on file  Transportation Needs: Not on file  Physical Activity: Not on file  Stress: Not on file  Social Connections: Not on file  Intimate Partner Violence: Not on file     BP (!) 144/80   Pulse (!) 59   Ht 5\' 8"  (1.727 m)   Wt 178 lb 12.8 oz (81.1 kg)   SpO2 97%   BMI 27.19 kg/m   Physical  Exam:  Well appearing NAD HEENT: Unremarkable Neck:  No JVD, no thyromegally Lymphatics:  No adenopathy Back:  No CVA tenderness Lungs:  Clear HEART:  Regular rate rhythm, no murmurs, no rubs, no clicks Abd:  soft, positive bowel sounds, no organomegally, no rebound, no guarding Ext:  2 plus pulses, no edema, no cyanosis, no clubbing Skin:  No rashes no nodules Neuro:  CN II through XII intact, motor grossly intact  EKG - nsr   Assess/Plan:  1. PVC's - I recommend he continues his amiodarone at the current dose of 1000 mg a week. His energy level has improved. He will check a 3 day zio monitor and I would anticipate reducing his dose of amiodarone.  2. CAD - he denies anginal symptoms. 3. HTN - his bp is controlled. He is encouraged to remain active. 4. Dyslipidemia - he will continue lipitor.     [name] Tod Abrahamsen,MD

## 2023-07-08 NOTE — Patient Instructions (Signed)

## 2023-08-11 ENCOUNTER — Other Ambulatory Visit: Payer: Self-pay | Admitting: Internal Medicine

## 2023-12-26 ENCOUNTER — Other Ambulatory Visit: Payer: Self-pay | Admitting: Cardiology

## 2023-12-28 ENCOUNTER — Telehealth: Payer: Self-pay | Admitting: Cardiology

## 2023-12-28 MED ORDER — METOPROLOL TARTRATE 50 MG PO TABS: 50.0000 mg | ORAL_TABLET | Freq: Two times a day (BID) | ORAL | 0 refills | Status: DC

## 2023-12-28 NOTE — Telephone Encounter (Signed)
 Pt's medication was sent to pt's pharmacy as requested. Confirmation received.

## 2023-12-28 NOTE — Telephone Encounter (Signed)
*  STAT* If patient is at the pharmacy, call can be transferred to refill team.   1. Which medications need to be refilled? (please list name of each medication and dose if known) metoprolol  tartrate (LOPRESSOR ) 50 MG tablet   2. Which pharmacy/location (including street and city if local pharmacy) is medication to be sent to? [facility] [place], [place] - [address]   3. Do they need a 30 day or 90 day supply? 90

## 2024-02-13 ENCOUNTER — Other Ambulatory Visit: Payer: Self-pay | Admitting: Cardiology

## 2024-03-22 ENCOUNTER — Ambulatory Visit: Attending: Cardiology | Admitting: Cardiology

## 2024-03-22 ENCOUNTER — Ambulatory Visit (HOSPITAL_COMMUNITY)
Admission: RE | Admit: 2024-03-22 | Discharge: 2024-03-22 | Disposition: A | Discharge status: Home / Self Care | Source: Ambulatory Visit | Attending: Cardiology | Admitting: Cardiology

## 2024-03-22 ENCOUNTER — Encounter: Payer: Self-pay | Admitting: Cardiology

## 2024-03-22 VITALS — BP 162/70 | HR 70 | Resp 16 | Ht 68.0 in | Wt 171.6 lb

## 2024-03-22 DIAGNOSIS — I10 Essential (primary) hypertension: Secondary | ICD-10-CM | POA: Diagnosis not present

## 2024-03-22 DIAGNOSIS — Z79899 Other long term (current) drug therapy: Secondary | ICD-10-CM | POA: Diagnosis present

## 2024-03-22 DIAGNOSIS — I2510 Atherosclerotic heart disease of native coronary artery without angina pectoris: Secondary | ICD-10-CM | POA: Diagnosis not present

## 2024-03-22 DIAGNOSIS — E782 Mixed hyperlipidemia: Secondary | ICD-10-CM | POA: Diagnosis not present

## 2024-03-22 DIAGNOSIS — I493 Ventricular premature depolarization: Secondary | ICD-10-CM

## 2024-03-22 NOTE — Progress Notes (Signed)
 Cardiology Office Note:  .   Date:  03/22/2024  ID:  Franklin Ward, DOB [birth year], MRN [id number] PCP: Arloa Elsie JONELLE, MD  [facility] Providers Cardiologist:  Oneil Parchment, MD    History of Present Illness: .   Franklin Ward is a 78 y.o. male Discussed the use of AI scribe software for clinical note transcription with the patient, who gave verbal consent to proceed.  History of Present Illness Franklin Ward is a 78 year old male with coronary artery disease and symptomatic PVCs who presents for follow-up. He is a former patient of Dr. Victory Sharps and has been under the care of Dr. Cathlyn Birmingham, who is retiring soon.  He has a history of coronary artery disease and is status post non-ST elevation myocardial infarction. He underwent heart catheterization on August 10, 2018, and has been managed with medical therapy since then. He was previously started on amiodarone , which has reduced his PVC burden from 24% to less than 4%. His last monitor in 2022 showed 3.4% PVCs with no atrial fibrillation.  He is currently taking amiodarone  at a dose of 200 mg Monday through Friday. He also takes Plavix  75 mg, Zetia  10 mg once daily, and metoprolol  50 mg twice daily. No chest pain or edema, but occasionally experiences a sensation of feeling hot. He feels 'tired and wore out'.  His past medical history includes a drug-eluting stent placement in the RCA and residual disease not amenable to PCI, with a mid LAD 90%, D1 90%, and first osteo branch 60% managed medically.  During the review of symptoms, no chest pain, shortness of breath, or edema. Occasionally feels hot. Reports feeling tired and worn out.  He has difficulty with the appointment system, noting challenges in scheduling and communication, as he does not use a smartphone or computer.      Studies Reviewed: SABRA   EKG Interpretation Date/Time:  Thursday March 22 2024 08:38:00 EDT Ventricular Rate:  63 PR  Interval:  174 QRS Duration:  94 QT Interval:  440 QTC Calculation: 450 R Axis:   -10  Text Interpretation: Normal sinus rhythm Moderate voltage criteria for LVH, may be normal variant ( R in aVL , Cornell product ) Anterior infarct , age undetermined ST & T wave abnormality, consider inferolateral ischemia When compared with ECG of 08-Jul-2023 09:35, Anterior infarct is now Present T wave inversion now evident in Inferior leads T wave inversion now evident in Lateral leads Confirmed by Parchment Oneil ([id number]) on 03/22/2024 8:39:41 AM    Results LABS LDL: 53 A1c: 5.7 Hemoglobin: 14.6 Creatinine: 0.85  DIAGNOSTIC Heart catheterization: Coronary intervention (08/10/2018) PVC monitoring: 3.4% PVCs, no atrial fibrillation (2022) Risk Assessment/Calculations:           Physical Exam:   VS:  BP (!) 162/70 (BP Location: Right Arm, Patient Position: Sitting, Cuff Size: Normal)   Pulse 70   Resp 16   Ht 5' 8 (1.727 m)   Wt 171 lb 9.6 oz (77.8 kg)   SpO2 97%   BMI 26.09 kg/m    Wt Readings from Last 3 Encounters:  03/22/24 171 lb 9.6 oz (77.8 kg)  07/08/23 178 lb 12.8 oz (81.1 kg)  02/18/23 180 lb (81.6 kg)    GEN: Well nourished, well developed in no acute distress NECK: No JVD; No carotid bruits CARDIAC: RRR, no murmurs, no rubs, no gallops RESPIRATORY:  Clear to auscultation without rales, wheezing or rhonchi  ABDOMEN: Soft, non-tender, non-distended EXTREMITIES:  No edema; No deformity   ASSESSMENT AND PLAN: .    Assessment and Plan Assessment & Plan Symptomatic premature ventricular contractions (PVCs) The PVC burden has decreased from 24% to less than 4% with amiodarone  therapy. The last monitor in 2022 showed 3.4% PVCs with no atrial fibrillation. Occasionally experiences a sensation of feeling hot but no chest pain or edema. - Continue amiodarone  at 1000 mg per week as prescribed by Dr. Cathlyn Birmingham. - Check blood work including liver function and thyroid  tests to monitor  amiodarone  effects. - Order a stress test to evaluate cardiac function and ensure adequate blood flow with stents. - Check chest x-ray due to amiodarone  use.  Coronary artery disease, status post non-ST elevation myocardial infarction with coronary stents 2020 Coronary artery disease with a non-ST elevation myocardial infarction and coronary stents. Currently asymptomatic with no chest pain or shortness of breath. EKG showed minor changes, possibly due to stress from the morning's events. - Order a stress test to assess blood flow and stent function in the setting of new T wave inversion with possible ischemia. - Continue Plavix  75 mg daily. - Schedule follow-up appointments every six months due to amiodarone  use.  Hyperlipidemia LDL cholesterol is well-controlled at 53 mg/dL. Currently on Ezetimibe  10 mg daily and CoQ10 with red yeast rice. - Continue Ezetimibe  10 mg daily. - Monitor lipid levels regularly.       Informed Consent   Shared Decision Making/Informed Consent The risks [chest pain, shortness of breath, cardiac arrhythmias, dizziness, blood pressure fluctuations, myocardial infarction, stroke/transient ischemic attack, nausea, vomiting, allergic reaction, radiation exposure, metallic taste sensation and life-threatening complications (estimated to be 1 in 10,000)], benefits (risk stratification, diagnosing coronary artery disease, treatment guidance) and alternatives of a nuclear stress test were discussed in detail with Franklin Ward and he agrees to proceed.     Dispo: 6  Signed, Oneil Parchment, MD

## 2024-03-22 NOTE — Patient Instructions (Signed)
 Medication Instructions:  The current medical regimen is effective;  continue present plan and medications.  *If you need a refill on your cardiac medications before your next appointment, please call your pharmacy*  Lab Work: Please have blood work today (CBC, CMP, TSH and Lipid panel)  1st floor  If you have labs (blood work) drawn today and your tests are completely normal, you will receive your results only by: MyChart Message (if you have MyChart) OR A paper copy in the mail If you have any lab test that is abnormal or we need to change your treatment, we will call you to review the results.  Testing/Procedures: You are being scheduled for a Lexiscan Myoview.  You are scheduled for a Lexisscan Myocardial Perfusion Imaging Study on     at     .   Please arrive 15 minutes prior to your appointment time for registration and insurance purposes.   The test will take approximately 3 to 4 hours to complete; you may bring reading material. If someone comes with you to your appointment, they will need to remain in the main lobby due to limited space in the testing area.   How to prepare for your Lexiscan Myocardial Perfusion test:   Do not eat or drink 3 hours prior to your test, except you may have water.    Do not consume products containing caffeine (regular or decaffeinated) 12 hours prior to your test (ex: coffee, chocolate, soda, tea)   Do bring a list of your current medications with you. If not listed below, you may take your medications as normal.   Bring any held medication to your appointment, as you may be required to take it once the test is complete.   Do wear comfortable clothes (no dresses or overalls) and walking shoes. Tennis shoes are preferred. No heels or open toed shoes.  Do not wear cologne, perfume, aftershave or lotions (deodorant is allowed).   If these instructions are not followed, you test will have to be rescheduled.   Please report to [address] for your test. If you have questions or concerns about your appointment, please call the Nuclear Lab at #[phone].  If you cannot keep your appointment, please provide 24 hour notification to the Nuclear lab to avoid a possible $50 charge to your account.   Please have a Chest XRay today on the 2nd floor.  Follow-Up: At [facility], you and your health needs are our priority.  As part of our continuing mission to provide you with exceptional heart care, our providers are all part of one team.  This team includes your primary Cardiologist (physician) and Advanced Practice Providers or APPs (Physician Assistants and Nurse Practitioners) who all work together to provide you with the care you need, when you need it.  Your next appointment:   6 month(s)  Provider:   One of our Advanced Practice Providers (APPs): Morse Clause, PA-C  Lamarr Satterfield, NP Miriam Shams, NP  Olivia Pavy, PA-C Josefa Beauvais, NP  Leontine Salen, PA-C Orren Fabry, PA-C  Hao Meng, PA-C Ernest Dick, NP  Damien Braver, NP Jon Hails, PA-C  Waddell Donath, PA-C    Dayna Dunn, PA-C  Scott Weaver, PA-C Lum Louis, NP Katlyn West, NP Callie Goodrich, PA-C  Xika Zhao, NP Sheng Haley, PA-C    Kathleen Johnson, PA-C       We recommend signing up for the patient portal called MyChart.  Sign up information is provided on this  After Visit Summary.  MyChart is used to connect with patients for Virtual Visits (Telemedicine).  Patients are able to view lab/test results, encounter notes, upcoming appointments, etc.  Non-urgent messages can be sent to your provider as well.   To learn more about what you can do with MyChart, go to [email or web address].

## 2024-03-23 ENCOUNTER — Telehealth (HOSPITAL_COMMUNITY): Payer: Self-pay | Admitting: *Deleted

## 2024-03-23 ENCOUNTER — Other Ambulatory Visit: Payer: Self-pay | Admitting: Cardiology

## 2024-03-23 DIAGNOSIS — I2510 Atherosclerotic heart disease of native coronary artery without angina pectoris: Secondary | ICD-10-CM

## 2024-03-23 LAB — LIPID PANEL
Chol/HDL Ratio: 3 ratio (ref 0.0–5.0)
Cholesterol, Total: 128 mg/dL (ref 100–199)
HDL: 42 mg/dL (ref >39)
LDL Chol Calc (NIH): 71 mg/dL (ref 0–99)
Triglycerides: 76 mg/dL (ref 0–149)
VLDL Cholesterol Cal: 15 mg/dL (ref 5–40)

## 2024-03-23 LAB — CBC
Hematocrit: 45.9 % (ref 37.5–51.0)
Hemoglobin: 15.2 g/dL (ref 13.0–17.7)
MCH: 31.4 pg (ref 26.6–33.0)
MCHC: 33.1 g/dL (ref 31.5–35.7)
MCV: 95 fL (ref 79–97)
Platelets: 265 x10E3/uL (ref 150–450)
RBC: 4.84 x10E6/uL (ref 4.14–5.80)
RDW: 12.6 % (ref 11.6–15.4)
WBC: 10.7 x10E3/uL (ref 3.4–10.8)

## 2024-03-23 LAB — COMPREHENSIVE METABOLIC PANEL WITH GFR
ALT: 18 IU/L (ref 0–44)
AST: 28 IU/L (ref 0–40)
Albumin: 4.6 g/dL (ref 3.8–4.8)
Alkaline Phosphatase: 55 IU/L (ref 47–123)
BUN/Creatinine Ratio: 20 (ref 10–24)
BUN: 16 mg/dL (ref 8–27)
Bilirubin Total: 0.3 mg/dL (ref 0.0–1.2)
CO2: 24 mmol/L (ref 20–29)
Calcium: 9.5 mg/dL (ref 8.6–10.2)
Chloride: 100 mmol/L (ref 96–106)
Creatinine, Ser: 0.8 mg/dL (ref 0.76–1.27)
Globulin, Total: 2.6 g/dL (ref 1.5–4.5)
Glucose: 109 mg/dL — ABNORMAL HIGH (ref 70–99)
Potassium: 5.1 mmol/L (ref 3.5–5.2)
Sodium: 137 mmol/L (ref 134–144)
Total Protein: 7.2 g/dL (ref 6.0–8.5)
eGFR: 90 mL/min/1.73 (ref >59)

## 2024-03-23 LAB — TSH: TSH: 2.74 u[IU]/mL (ref 0.450–4.500)

## 2024-03-23 NOTE — Telephone Encounter (Signed)
 Patient given detailed instructions per Myocardial Perfusion Study Information Sheet for the test on 03/27/2004 at 1:00. Patient notified to arrive 15 minutes early and that it is imperative to arrive on time for appointment to keep from having the test rescheduled.  If you need to cancel or reschedule your appointment, please call the office within 24 hours of your appointment. . Patient verbalized understanding.Franklin Ward

## 2024-03-26 ENCOUNTER — Ambulatory Visit: Payer: Self-pay | Admitting: Cardiology

## 2024-03-27 ENCOUNTER — Ambulatory Visit (HOSPITAL_COMMUNITY)
Admission: RE | Admit: 2024-03-27 | Discharge: 2024-03-27 | Disposition: A | Discharge status: Home / Self Care | Source: Ambulatory Visit | Attending: Student in an Organized Health Care Education/Training Program | Admitting: Student in an Organized Health Care Education/Training Program

## 2024-03-27 DIAGNOSIS — I2510 Atherosclerotic heart disease of native coronary artery without angina pectoris: Secondary | ICD-10-CM | POA: Diagnosis not present

## 2024-03-27 LAB — MYOCARDIAL PERFUSION IMAGING
LV dias vol: 124 mL (ref 62–150)
LV sys vol: 66 mL (ref 4.2–5.8)
Nuc Stress EF: 47 %
Peak HR: 61 {beats}/min
Rest HR: 48 {beats}/min
Rest Nuclear Isotope Dose: 10.1 mCi
SDS: 2
SRS: 9
SSS: 10
ST Depression (mm): 0 mm
Stress Nuclear Isotope Dose: 31 mCi
TID: 0.9

## 2024-03-27 MED ORDER — REGADENOSON 0.4 MG/5ML IV SOLN
0.4000 mg | Freq: Once | INTRAVENOUS | Status: AC
  Administered 2024-03-27: 0.4 mg via INTRAVENOUS

## 2024-03-27 MED ORDER — REGADENOSON 0.4 MG/5ML IV SOLN
INTRAVENOUS | Status: AC
  Filled 2024-03-27: qty 5

## 2024-03-27 MED ORDER — TECHNETIUM TC 99M TETROFOSMIN IV KIT
10.1000 | PACK | Freq: Once | INTRAVENOUS | Status: AC | PRN
  Administered 2024-03-27: 10.1 via INTRAVENOUS

## 2024-03-27 MED ORDER — TECHNETIUM TC 99M TETROFOSMIN IV KIT
31.0000 | PACK | Freq: Once | INTRAVENOUS | Status: AC | PRN
  Administered 2024-03-27: 31 via INTRAVENOUS

## 2024-03-28 ENCOUNTER — Other Ambulatory Visit: Payer: Self-pay | Admitting: Cardiology

## 2024-04-09 ENCOUNTER — Other Ambulatory Visit: Payer: Self-pay | Admitting: Cardiology

## 2024-04-10 ENCOUNTER — Telehealth: Payer: Self-pay | Admitting: Cardiology

## 2024-04-10 MED ORDER — CLOPIDOGREL BISULFATE 75 MG PO TABS: 75.0000 mg | ORAL_TABLET | Freq: Every day | ORAL | 3 refills | Status: AC

## 2024-04-10 NOTE — Telephone Encounter (Signed)
*  STAT* If patient is at the pharmacy, call can be transferred to refill team.   1. Which medications need to be refilled? (please list name of each medication and dose if known) clopidogrel  (PLAVIX ) 75 MG tablet    2. Would you like to learn more about the convenience, safety, & potential cost savings by using the [facility]?      3. Are you open to using the [facility] (Type [facility].  ).   4. Which pharmacy/location (including street and city if local pharmacy) is medication to be sent to?  [facility] [place], [place] - [address]     5. Do they need a 30 day or 90 day supply? 90 day

## 2024-04-10 NOTE — Telephone Encounter (Signed)
 RX sent in

## 2024-06-22 ENCOUNTER — Inpatient Hospital Stay (HOSPITAL_COMMUNITY)

## 2024-06-22 ENCOUNTER — Encounter (HOSPITAL_COMMUNITY): Payer: Self-pay

## 2024-06-22 ENCOUNTER — Other Ambulatory Visit: Payer: Self-pay

## 2024-06-22 ENCOUNTER — Inpatient Hospital Stay (HOSPITAL_COMMUNITY)
Admission: EM | Admit: 2024-06-22 | Discharge: 2024-07-05 | DRG: 335 | Disposition: A | Discharge status: Home Health | Attending: Internal Medicine | Admitting: Internal Medicine

## 2024-06-22 ENCOUNTER — Emergency Department (HOSPITAL_COMMUNITY)

## 2024-06-22 DIAGNOSIS — K5652 Intestinal adhesions [bands] with complete obstruction: Principal | ICD-10-CM | POA: Diagnosis present

## 2024-06-22 DIAGNOSIS — J690 Pneumonitis due to inhalation of food and vomit: Secondary | ICD-10-CM | POA: Diagnosis not present

## 2024-06-22 DIAGNOSIS — J9601 Acute respiratory failure with hypoxia: Secondary | ICD-10-CM | POA: Diagnosis not present

## 2024-06-22 DIAGNOSIS — Z7902 Long term (current) use of antithrombotics/antiplatelets: Secondary | ICD-10-CM | POA: Diagnosis not present

## 2024-06-22 DIAGNOSIS — K5730 Diverticulosis of large intestine without perforation or abscess without bleeding: Secondary | ICD-10-CM | POA: Diagnosis present

## 2024-06-22 DIAGNOSIS — I4891 Unspecified atrial fibrillation: Secondary | ICD-10-CM | POA: Diagnosis not present

## 2024-06-22 DIAGNOSIS — I2510 Atherosclerotic heart disease of native coronary artery without angina pectoris: Secondary | ICD-10-CM | POA: Diagnosis present

## 2024-06-22 DIAGNOSIS — E8809 Other disorders of plasma-protein metabolism, not elsewhere classified: Secondary | ICD-10-CM | POA: Diagnosis present

## 2024-06-22 DIAGNOSIS — I252 Old myocardial infarction: Secondary | ICD-10-CM | POA: Diagnosis not present

## 2024-06-22 DIAGNOSIS — L89151 Pressure ulcer of sacral region, stage 1: Secondary | ICD-10-CM | POA: Diagnosis present

## 2024-06-22 DIAGNOSIS — E872 Acidosis, unspecified: Secondary | ICD-10-CM | POA: Diagnosis present

## 2024-06-22 DIAGNOSIS — K56609 Unspecified intestinal obstruction, unspecified as to partial versus complete obstruction: Secondary | ICD-10-CM | POA: Diagnosis present

## 2024-06-22 DIAGNOSIS — K567 Ileus, unspecified: Secondary | ICD-10-CM | POA: Diagnosis present

## 2024-06-22 DIAGNOSIS — I493 Ventricular premature depolarization: Secondary | ICD-10-CM | POA: Diagnosis present

## 2024-06-22 DIAGNOSIS — R5381 Other malaise: Secondary | ICD-10-CM | POA: Diagnosis not present

## 2024-06-22 DIAGNOSIS — K4090 Unilateral inguinal hernia, without obstruction or gangrene, not specified as recurrent: Secondary | ICD-10-CM | POA: Diagnosis present

## 2024-06-22 DIAGNOSIS — E785 Hyperlipidemia, unspecified: Secondary | ICD-10-CM | POA: Diagnosis present

## 2024-06-22 DIAGNOSIS — I10 Essential (primary) hypertension: Secondary | ICD-10-CM | POA: Diagnosis present

## 2024-06-22 DIAGNOSIS — Z955 Presence of coronary angioplasty implant and graft: Secondary | ICD-10-CM

## 2024-06-22 DIAGNOSIS — Z91030 Bee allergy status: Secondary | ICD-10-CM | POA: Diagnosis not present

## 2024-06-22 DIAGNOSIS — N400 Enlarged prostate without lower urinary tract symptoms: Secondary | ICD-10-CM | POA: Diagnosis present

## 2024-06-22 DIAGNOSIS — K56601 Complete intestinal obstruction, unspecified as to cause: Principal | ICD-10-CM

## 2024-06-22 DIAGNOSIS — Z87891 Personal history of nicotine dependence: Secondary | ICD-10-CM

## 2024-06-22 DIAGNOSIS — Z79899 Other long term (current) drug therapy: Secondary | ICD-10-CM

## 2024-06-22 DIAGNOSIS — Z881 Allergy status to other antibiotic agents status: Secondary | ICD-10-CM

## 2024-06-22 DIAGNOSIS — R490 Dysphonia: Secondary | ICD-10-CM | POA: Diagnosis not present

## 2024-06-22 DIAGNOSIS — R1312 Dysphagia, oropharyngeal phase: Secondary | ICD-10-CM | POA: Diagnosis not present

## 2024-06-22 DIAGNOSIS — K565 Intestinal adhesions [bands], unspecified as to partial versus complete obstruction: Secondary | ICD-10-CM | POA: Diagnosis not present

## 2024-06-22 LAB — CBC WITH DIFFERENTIAL/PLATELET
Abs Immature Granulocytes: 0.01 K/uL (ref 0.00–0.07)
Basophils Absolute: 0 K/uL (ref 0.0–0.1)
Basophils Relative: 0 %
Eosinophils Absolute: 0 K/uL (ref 0.0–0.5)
Eosinophils Relative: 1 %
HCT: 49.8 % (ref 39.0–52.0)
Hemoglobin: 17.3 g/dL — ABNORMAL HIGH (ref 13.0–17.0)
Immature Granulocytes: 0 %
Lymphocytes Relative: 27 %
Lymphs Abs: 1.7 K/uL (ref 0.7–4.0)
MCH: 31.5 pg (ref 26.0–34.0)
MCHC: 34.7 g/dL (ref 30.0–36.0)
MCV: 90.5 fL (ref 80.0–100.0)
Monocytes Absolute: 1 K/uL (ref 0.1–1.0)
Monocytes Relative: 16 %
Neutro Abs: 3.6 K/uL (ref 1.7–7.7)
Neutrophils Relative %: 56 %
Platelets: 257 K/uL (ref 150–400)
RBC: 5.5 MIL/uL (ref 4.22–5.81)
RDW: 13.4 % (ref 11.5–15.5)
WBC: 6.4 K/uL (ref 4.0–10.5)
nRBC: 0 % (ref 0.0–0.2)

## 2024-06-22 LAB — COMPREHENSIVE METABOLIC PANEL WITH GFR
ALT: 23 U/L (ref 0–44)
AST: 50 U/L — ABNORMAL HIGH (ref 15–41)
Albumin: 4.4 g/dL (ref 3.5–5.0)
Alkaline Phosphatase: 55 U/L (ref 38–126)
Anion gap: 17 — ABNORMAL HIGH (ref 5–15)
BUN: 46 mg/dL — ABNORMAL HIGH (ref 8–23)
CO2: 22 mmol/L (ref 22–32)
Calcium: 10.2 mg/dL (ref 8.9–10.3)
Chloride: 96 mmol/L — ABNORMAL LOW (ref 98–111)
Creatinine, Ser: 1.17 mg/dL (ref 0.61–1.24)
GFR, Estimated: >60 mL/min
Glucose, Bld: 105 mg/dL — ABNORMAL HIGH (ref 70–99)
Potassium: 4.5 mmol/L (ref 3.5–5.1)
Sodium: 134 mmol/L — ABNORMAL LOW (ref 135–145)
Total Bilirubin: 1.2 mg/dL (ref 0.0–1.2)
Total Protein: 7.8 g/dL (ref 6.5–8.1)

## 2024-06-22 LAB — I-STAT CHEM 8, ED
BUN: 46 mg/dL — ABNORMAL HIGH (ref 8–23)
Calcium, Ion: 1.15 mmol/L (ref 1.15–1.40)
Chloride: 101 mmol/L (ref 98–111)
Creatinine, Ser: 1.2 mg/dL (ref 0.61–1.24)
Glucose, Bld: 108 mg/dL — ABNORMAL HIGH (ref 70–99)
HCT: 53 % — ABNORMAL HIGH (ref 39.0–52.0)
Hemoglobin: 18 g/dL — ABNORMAL HIGH (ref 13.0–17.0)
Potassium: 4.4 mmol/L (ref 3.5–5.1)
Sodium: 136 mmol/L (ref 135–145)
TCO2: 23 mmol/L (ref 22–32)

## 2024-06-22 LAB — LIPASE, BLOOD: Lipase: 24 U/L (ref 11–51)

## 2024-06-22 LAB — TROPONIN T, HIGH SENSITIVITY
Troponin T High Sensitivity: 70 ng/L — ABNORMAL HIGH (ref 0–19)
Troponin T High Sensitivity: 62 ng/L — ABNORMAL HIGH (ref 0–19)

## 2024-06-22 MED ORDER — METOPROLOL TARTRATE 5 MG/5ML IV SOLN
5.0000 mg | Freq: Three times a day (TID) | INTRAVENOUS | Status: DC
  Administered 2024-06-22 – 2024-06-26 (×11): 5 mg via INTRAVENOUS
  Filled 2024-06-22 (×11): qty 5

## 2024-06-22 MED ORDER — SODIUM CHLORIDE 0.9% FLUSH
3.0000 mL | Freq: Two times a day (BID) | INTRAVENOUS | Status: AC
  Administered 2024-06-22 – 2024-07-05 (×21): 3 mL via INTRAVENOUS

## 2024-06-22 MED ORDER — CARBOXYMETHYLCELLULOSE SODIUM 0.5 % OP SOLN: 1.0000 [drp] | Freq: Every day | OPHTHALMIC | Status: DC | PRN

## 2024-06-22 MED ORDER — ACETAMINOPHEN 325 MG PO TABS: 650.0000 mg | ORAL_TABLET | Freq: Four times a day (QID) | ORAL | Status: AC | PRN

## 2024-06-22 MED ORDER — SODIUM CHLORIDE 0.9 % IV SOLN: INTRAVENOUS | Status: AC

## 2024-06-22 MED ORDER — METOPROLOL TARTRATE 50 MG PO TABS: 50.0000 mg | ORAL_TABLET | Freq: Two times a day (BID) | ORAL | Status: DC

## 2024-06-22 MED ORDER — POLYVINYL ALCOHOL 1.4 % OP SOLN
1.0000 [drp] | Freq: Every day | OPHTHALMIC | Status: DC | PRN
  Administered 2024-06-30 – 2024-07-01 (×2): 1 [drp] via OPHTHALMIC
  Filled 2024-06-22: qty 15

## 2024-06-22 MED ORDER — AMIODARONE HCL 200 MG PO TABS
200.0000 mg | ORAL_TABLET | ORAL | Status: AC
  Administered 2024-06-25 – 2024-07-05 (×8): 200 mg via ORAL
  Filled 2024-06-22 (×8): qty 1

## 2024-06-22 MED ORDER — LACTATED RINGERS IV BOLUS
1000.0000 mL | Freq: Once | INTRAVENOUS | Status: AC
  Administered 2024-06-22: 1000 mL via INTRAVENOUS

## 2024-06-22 MED ORDER — AMIODARONE HCL 200 MG PO TABS: 200.0000 mg | ORAL_TABLET | Freq: Every day | ORAL | Status: DC

## 2024-06-22 MED ORDER — DIATRIZOATE MEGLUMINE & SODIUM 66-10 % PO SOLN
90.0000 mL | Freq: Once | ORAL | Status: AC
  Administered 2024-06-22: 90 mL via NASOGASTRIC
  Filled 2024-06-22: qty 90

## 2024-06-22 MED ORDER — METOPROLOL TARTRATE 5 MG/5ML IV SOLN: 5.0000 mg | Freq: Three times a day (TID) | INTRAVENOUS | Status: DC

## 2024-06-22 MED ORDER — EZETIMIBE 10 MG PO TABS
10.0000 mg | ORAL_TABLET | Freq: Every day | ORAL | Status: AC
  Administered 2024-06-23 – 2024-07-05 (×12): 10 mg via ORAL
  Filled 2024-06-22 (×12): qty 1

## 2024-06-22 MED ORDER — METOPROLOL TARTRATE 5 MG/5ML IV SOLN: 10.0000 mg | Freq: Three times a day (TID) | INTRAVENOUS | Status: DC

## 2024-06-22 MED ORDER — ACETAMINOPHEN 650 MG RE SUPP: 650.0000 mg | Freq: Four times a day (QID) | RECTAL | Status: AC | PRN

## 2024-06-22 MED ORDER — IOHEXOL 350 MG/ML SOLN
75.0000 mL | Freq: Once | INTRAVENOUS | Status: AC | PRN
  Administered 2024-06-22: 75 mL via INTRAVENOUS

## 2024-06-22 MED ORDER — ONDANSETRON HCL 4 MG/2ML IJ SOLN
4.0000 mg | Freq: Once | INTRAMUSCULAR | Status: AC
  Administered 2024-06-22: 4 mg via INTRAVENOUS
  Filled 2024-06-22: qty 2

## 2024-06-22 NOTE — ED Notes (Signed)
 600 ml of black drainage from the n-g tube  alert not distress his daughter is at  the bedside

## 2024-06-22 NOTE — ED Provider Notes (Signed)
 "  EMERGENCY DEPARTMENT AT [facility] Provider Note   CSN: [id number] Arrival date & time: 06/22/24  9163     Patient presents with: Emesis   Franklin Ward is a 79 y.o. male.    Emesis    Patient has a history of coronary artery disease diverticulitis.  He presents to the ED for evaluation of abdominal pain and vomiting.  Patient states he has not been able to eat and drink normally since Wednesday.  He has not had a bowel movement.  He said multiple episodes of vomiting.  He is not able to keep anything down.  Prior to Admission medications  Medication Sig Start Date End Date Taking? Authorizing Provider  amiodarone  (PACERONE ) 200 MG tablet TAKE ONE TABLET BY MOUTH DAILY MONDAY - FRIDAY (DO NOT TAKE SATURDAY OR SUNDAY) 08/11/23   Waddell Danelle ORN, MD  Ascorbic Acid (VITAMIN C PO) Take 1,000 mg by mouth daily.    [provider]  Calcium  Carb-Cholecalciferol (CALCIUM  600 + D PO) Take 1 tablet by mouth daily.     [provider]  carboxymethylcellulose (REFRESH PLUS) 0.5 % SOLN Place 1 drop into both eyes daily as needed (dry eyes).    [provider]  cholecalciferol (VITAMIN D3) 25 MCG (1000 UT) tablet Take 5,000 Units by mouth daily.     [provider]  clopidogrel  (PLAVIX ) 75 MG tablet Take 1 tablet (75 mg total) by mouth daily. 04/10/24   Jeffrie Oneil BROCKS, MD  Cyanocobalamin  (B-12) 2500 MCG TABS Take 1 tablet by mouth daily.     [provider]  ezetimibe  (ZETIA ) 10 MG tablet TAKE ONE TABLET BY MOUTH DAILY 06/17/23   Jeffrie Oneil BROCKS, MD  metoprolol  tartrate (LOPRESSOR ) 50 MG tablet Take 1 tablet (50 mg total) by mouth 2 (two) times daily. 03/28/24   Jeffrie Oneil BROCKS, MD  Misc Natural Products (OSTEO BI-FLEX ADV TRIPLE ST PO) Take 1 tablet by mouth 2 (two) times daily.     [provider]  Multiple Vitamin (MULTIVITAMIN) tablet Take 1 tablet by mouth daily.    [provider]  nitroGLYCERIN  (NITROSTAT )  0.3 MG SL tablet Place 1 tablet (0.3 mg total) under the tongue every 5 (five) minutes as needed for chest pain. 08/11/18 03/22/24  Drusilla Sabas RAMAN, MD  Psyllium (METAMUCIL FIBER PO) Take by mouth See admin instructions. Mix 1 teaspoonful of powder into a glass of water and drink two to three times a day    [provider]  Turmeric 500 MG CAPS Take 500 mg by mouth daily.     [provider]  zinc gluconate 50 MG tablet Take 50 mg by mouth daily.    [provider]    Allergies: Bee venom, Erythromycin, and Tetracyclines & related    Review of Systems  Gastrointestinal:  Positive for vomiting.    Updated Vital Signs BP (!) 165/98 (BP Location: Left Arm)   Pulse 99   Temp (!) 97.4 F (36.3 C)   Resp 18   Ht 1.727 m (5' 8)   Wt 77.8 kg   SpO2 97%   BMI 26.08 kg/m   Physical Exam Vitals and nursing note reviewed.  Constitutional:      General: He is not in acute distress.    Appearance: He is well-developed.  HENT:     Head: Normocephalic and atraumatic.     Right Ear: External ear normal.     Left Ear: External ear normal.  Eyes:     General: No scleral icterus.       Right eye: No discharge.        Left eye: No discharge.     Conjunctiva/sclera: Conjunctivae normal.  Neck:     Trachea: No tracheal deviation.  Cardiovascular:     Rate and Rhythm: Normal rate and regular rhythm.  Pulmonary:     Effort: Pulmonary effort is normal. No respiratory distress.     Breath sounds: Normal breath sounds. No stridor. No wheezing or rales.  Abdominal:     General: Bowel sounds are normal. There is distension.     Palpations: Abdomen is soft.     Tenderness: There is abdominal tenderness. There is no guarding or rebound.  Musculoskeletal:        General: No tenderness or deformity.     Cervical back: Neck supple.  Skin:    General: Skin is warm and dry.     Findings: No rash.  Neurological:     General: No focal deficit present.     Mental Status: He  is alert.     Cranial Nerves: No cranial nerve deficit, dysarthria or facial asymmetry.     Sensory: No sensory deficit.     Motor: No abnormal muscle tone or seizure activity.     Coordination: Coordination normal.  Psychiatric:        Mood and Affect: Mood normal.     (all labs ordered are listed, but only abnormal results are displayed) Labs Reviewed  COMPREHENSIVE METABOLIC PANEL WITH GFR - Abnormal; Notable for the following components:      Result Value   Sodium 134 (*)    Chloride 96 (*)    Glucose, Bld 105 (*)    BUN 46 (*)    AST 50 (*)    Anion gap 17 (*)    All other components within normal limits  CBC WITH DIFFERENTIAL/PLATELET - Abnormal; Notable for the following components:   Hemoglobin 17.3 (*)    All other components within normal limits  I-STAT CHEM 8, ED - Abnormal; Notable for the following components:   BUN 46 (*)    Glucose, Bld 108 (*)    Hemoglobin 18.0 (*)    HCT 53.0 (*)    All other components within normal limits  TROPONIN T, HIGH SENSITIVITY - Abnormal; Notable for the following components:   Troponin T High Sensitivity 70 (*)    All other components within normal limits  TROPONIN T, HIGH SENSITIVITY - Abnormal; Notable for the following components:   Troponin T High Sensitivity 62 (*)    All other components within normal limits  LIPASE, BLOOD  URINALYSIS, ROUTINE W REFLEX MICROSCOPIC    EKG: EKG Interpretation Date/Time:  Friday June 22 2024 09:03:28 EST Ventricular Rate:  96 PR Interval:  162 QRS Duration:  92 QT Interval:  378 QTC Calculation: 477 R Axis:   -49  Text Interpretation: Sinus rhythm with frequent Premature ventricular complexes Possible Left atrial enlargement Left anterior fascicular block Abnormal ECG When compared with ECG of 22-Mar-2024 08:38, PREVIOUS ECG IS PRESENT Confirmed by Elnor Savant ([id number]) on 06/22/2024 9:12:21 AM  Radiology: CT ABDOMEN PELVIS W CONTRAST Result Date: 06/22/2024 CLINICAL DATA:  Bowel  obstruction suspected EXAM: CT ABDOMEN AND PELVIS WITH CONTRAST TECHNIQUE: Multidetector CT imaging of the abdomen and pelvis was performed using the standard protocol following bolus administration of intravenous contrast. RADIATION DOSE REDUCTION: This exam was performed according to the departmental dose-optimization program which  includes automated exposure control, adjustment of the mA and/or kV according to patient size and/or use of iterative reconstruction technique. CONTRAST:  75mL OMNIPAQUE  IOHEXOL  350 MG/ML SOLN COMPARISON:  July 06, 2016 FINDINGS: Lower chest: No acute abnormality. Hepatobiliary: No cholelithiasis or biliary dilatation is noted. Stable right hepatic cyst. Pancreas: Unremarkable. No pancreatic ductal dilatation or surrounding inflammatory changes. Spleen: Normal in size without focal abnormality. Adrenals/Urinary Tract: Adrenal glands appear normal. Nonobstructive renal cyst is noted. No hydronephrosis or renal obstruction is noted. Urinary bladder is unremarkable. Stomach/Bowel: Stomach and appendix are unremarkable. Severe proximal small bowel dilatation is noted with transition zone seen in central portion of abdomen on image number 34 series 2, consistent with small bowel obstruction, potentially due to adhesion. Sigmoid diverticulosis without inflammation. Stool is noted in the nondilated colon. Vascular/Lymphatic: Aortic atherosclerosis. No enlarged abdominal or pelvic lymph nodes. Reproductive: Mild prostatic enlargement. Other: Small fat and fluid-filled left inguinal hernia is noted. No ascites is noted. Musculoskeletal: No acute or significant osseous findings. IMPRESSION: 1. Severe proximal small bowel dilatation is noted with transition zone seen in central portion of abdomen, consistent with small bowel obstruction, potentially due to adhesion. 2. Sigmoid diverticulosis without inflammation. 3. Mild prostatic enlargement. 4. Small fat and fluid-filled left inguinal hernia.  5. Aortic atherosclerosis. Aortic Atherosclerosis (ICD10-I70.0). Electronically Signed   By: Lynwood Landy Raddle M.D.   On: 06/22/2024 11:21     Procedures   Medications Ordered in the ED  lactated ringers  bolus 1,000 mL (has no administration in time range)  ondansetron  (ZOFRAN ) injection 4 mg (has no administration in time range)  iohexol  (OMNIPAQUE ) 350 MG/ML injection 75 mL (75 mLs Intravenous Contrast Given 06/22/24 1022)    Clinical Course as of 06/25/24 1113  Fri Jun 22, 2024  1328 Comprehensive metabolic panel(!) Increased bun [JK]  1329 CBC with Differential(!) nl [JK]  1329 Troponin T, High Sensitivity(!) Increased but stable [JK]  1409 Case discussed with Dr Seena regarding admission [JK]  1417 Case discussed with Lizm general surgery.  Will see pt in consultation [JK]    Clinical Course User Index [JK] Randol Simmonds, MD                                 Medical Decision Making Problems Addressed: Complete intestinal obstruction, unspecified cause [facility]): acute illness or injury that poses a threat to life or bodily functions  Amount and/or Complexity of Data Reviewed Labs: ordered. Decision-making details documented in ED Course. Radiology: ordered.  Risk Prescription drug management. Decision regarding hospitalization.   Patient presented to the ED for evaluation of abdominal pain and vomiting.  Patient's laboratory test show elevated BUN consistent with dehydration.  No leukocytosis noted.  Patient did have troponins ordered at triage.  Troponins mildly elevated but they are flat.  I doubt ACS.  Patient CT scan does show findings consistent with a bowel obstruction.  Most likely related to adhesions.  Will place NG tube.  I have ordered IV fluids.  I will consult the medical service for admission.       Final diagnoses:  Complete intestinal obstruction, unspecified cause [facility])    ED Discharge Orders     None          Randol Simmonds, MD 06/25/24 1114  "

## 2024-06-22 NOTE — H&P (Signed)
 " History and Physical   Franklin Ward FMW:[id number] DOB: 23-Aug-1945 DOA: 06/22/2024  PCP: Arloa Elsie JONELLE, MD   Patient coming from: Home  Chief Complaint: Nausea vomiting, abdominal pain  HPI: Franklin Ward is a 79 y.o. male with medical history significant of hypertension, hyperlipidemia, CAD status post stent, PVCs, diverticulosis with history of diverticulitis and abscess presenting with nausea, vomiting, abdominal pain.  Patient states that he had symptoms starting a 3 days ago.  Has had an persistent abdominal pain with nausea vomiting and inability to tolerate p.o. intake.  No bowel movements in that time.  Denies fevers, chills, chest pain, shortness of breath.  ED Course: Vital signs in the ED notable for blood pressure in the 160s-170s systolic.  Lab workup included CMP with sodium 134, glucose 105, chloride 96, BUN 46, normal bicarb but gap 17, AST 50.  CBC within normal limits.  Troponin trend flat at 70, 62.  Lipase normal.  Urinalysis pending.  CT abdomen pelvis showed severe proximal small bowel dilation with transition zone consistent with SBO.  Diverticulosis without diverticulitis.  Mild prostatic enlargement, left inguinal hernia.  Patient received 1 L IV fluids, Zofran  in the ED.  NG tube placed.  EDP will consult general surgery for evaluation.  Review of Systems: As per HPI otherwise all other systems reviewed and are negative.  Past Medical History:  Diagnosis Date   Coronary artery disease    Diverticulitis    NSTEMI (non-ST elevated myocardial infarction) (HCC) 08/09/2018    Past Surgical History:  Procedure Laterality Date   CORONARY STENT INTERVENTION  08/10/2018   CORONARY STENT INTERVENTION N/A 08/10/2018   Procedure: CORONARY STENT INTERVENTION;  Surgeon: Jordan, Peter M, MD;  Location: [facility] INVASIVE CV LAB;  Service: Cardiovascular;  Laterality: N/A;   LEFT HEART CATH AND CORONARY ANGIOGRAPHY N/A 08/10/2018   Procedure: LEFT HEART CATH AND  CORONARY ANGIOGRAPHY;  Surgeon: Jordan, Peter M, MD;  Location: [facility] INVASIVE CV LAB;  Service: Cardiovascular;  Laterality: N/A;    Social History  reports that he has quit smoking. He has quit using smokeless tobacco.  His smokeless tobacco use included chew. He reports that he does not drink alcohol and does not use drugs.  Allergies[1]  Family History  Problem Relation Age of Onset   Lung cancer Father    Colon cancer Neg Hx   Reviewed on admission  Prior to Admission medications  Medication Sig Start Date End Date Taking? Authorizing Provider  amiodarone  (PACERONE ) 200 MG tablet TAKE ONE TABLET BY MOUTH DAILY MONDAY - FRIDAY (DO NOT TAKE SATURDAY OR SUNDAY) 08/11/23   Waddell Danelle ORN, MD  Ascorbic Acid (VITAMIN C PO) Take 1,000 mg by mouth daily.    [provider]  Calcium  Carb-Cholecalciferol (CALCIUM  600 + D PO) Take 1 tablet by mouth daily.     [provider]  carboxymethylcellulose (REFRESH PLUS) 0.5 % SOLN Place 1 drop into both eyes daily as needed (dry eyes).    [provider]  cholecalciferol (VITAMIN D3) 25 MCG (1000 UT) tablet Take 5,000 Units by mouth daily.     [provider]  clopidogrel  (PLAVIX ) 75 MG tablet Take 1 tablet (75 mg total) by mouth daily. 04/10/24   Jeffrie Oneil BROCKS, MD  Cyanocobalamin (B-12) 2500 MCG TABS Take 1 tablet by mouth daily.     [provider]  ezetimibe  (ZETIA ) 10 MG tablet TAKE ONE TABLET BY MOUTH DAILY 06/17/23   Jeffrie Oneil BROCKS, MD  metoprolol   tartrate (LOPRESSOR ) 50 MG tablet Take 1 tablet (50 mg total) by mouth 2 (two) times daily. 03/28/24   Jeffrie Oneil BROCKS, MD  Misc Natural Products (OSTEO BI-FLEX ADV TRIPLE ST PO) Take 1 tablet by mouth 2 (two) times daily.     [provider]  Multiple Vitamin (MULTIVITAMIN) tablet Take 1 tablet by mouth daily.    [provider]  nitroGLYCERIN  (NITROSTAT ) 0.3 MG SL tablet Place 1 tablet (0.3 mg total) under the tongue every 5 (five) minutes as  needed for chest pain. 08/11/18 03/22/24  Drusilla Sabas RAMAN, MD  Psyllium (METAMUCIL FIBER PO) Take by mouth See admin instructions. Mix 1 teaspoonful of powder into a glass of water and drink two to three times a day    [provider]  Turmeric 500 MG CAPS Take 500 mg by mouth daily.     [provider]  zinc gluconate 50 MG tablet Take 50 mg by mouth daily.    [provider]    Physical Exam: Vitals:   06/22/24 0840 06/22/24 0855 06/22/24 1400 06/22/24 1401  BP: (!) 165/98  (!) 166/93 (!) 166/93  Pulse: 99  85 89  Resp: 18     Temp: (!) 97.4 F (36.3 C)   97.6 F (36.4 C)  TempSrc:    Oral  SpO2: 97%  97% 100%  Weight:  77.8 kg    Height:  5' 8 (1.727 m)      Physical Exam Constitutional:      General: He is not in acute distress.    Appearance: Normal appearance.  HENT:     Head: Normocephalic and atraumatic.     Nose:     Comments: NGT in place on left    Mouth/Throat:     Mouth: Mucous membranes are moist.     Pharynx: Oropharynx is clear.  Eyes:     Extraocular Movements: Extraocular movements intact.     Pupils: Pupils are equal, round, and reactive to light.  Cardiovascular:     Rate and Rhythm: Normal rate and regular rhythm.     Pulses: Normal pulses.     Heart sounds: Normal heart sounds.  Pulmonary:     Effort: Pulmonary effort is normal. No respiratory distress.     Breath sounds: Normal breath sounds.  Abdominal:     General: Bowel sounds are normal. There is no distension.     Palpations: Abdomen is soft.     Tenderness: There is no abdominal tenderness.  Musculoskeletal:        General: No swelling or deformity.  Skin:    General: Skin is warm and dry.  Neurological:     General: No focal deficit present.     Mental Status: Mental status is at baseline.    Labs on Admission: I have personally reviewed following labs and imaging studies  CBC: Recent Labs  Lab 06/22/24 0553 06/22/24 0857  WBC 6.4  --   NEUTROABS 3.6   --   HGB 17.3* 18.0*  HCT 49.8 53.0*  MCV 90.5  --   PLT 257  --     Basic Metabolic Panel: Recent Labs  Lab 06/22/24 0553 06/22/24 0857  NA 134* 136  K 4.5 4.4  CL 96* 101  CO2 22  --   GLUCOSE 105* 108*  BUN 46* 46*  CREATININE 1.17 1.20  CALCIUM  10.2  --     GFR: Estimated Creatinine Clearance: 48.3 mL/min (by C-G formula based on SCr of  1.2 mg/dL).  Liver Function Tests: Recent Labs  Lab 06/22/24 0553  AST 50*  ALT 23  ALKPHOS 55  BILITOT 1.2  PROT 7.8  ALBUMIN 4.4    Urine analysis: No results found for: COLORURINE, APPEARANCEUR, LABSPEC, PHURINE, GLUCOSEU, HGBUR, BILIRUBINUR, KETONESUR, PROTEINUR, UROBILINOGEN, NITRITE, LEUKOCYTESUR  Radiological Exams on Admission: CT ABDOMEN PELVIS W CONTRAST Result Date: 06/22/2024 CLINICAL DATA:  Bowel obstruction suspected EXAM: CT ABDOMEN AND PELVIS WITH CONTRAST TECHNIQUE: Multidetector CT imaging of the abdomen and pelvis was performed using the standard protocol following bolus administration of intravenous contrast. RADIATION DOSE REDUCTION: This exam was performed according to the departmental dose-optimization program which includes automated exposure control, adjustment of the mA and/or kV according to patient size and/or use of iterative reconstruction technique. CONTRAST:  75mL OMNIPAQUE  IOHEXOL  350 MG/ML SOLN COMPARISON:  July 06, 2016 FINDINGS: Lower chest: No acute abnormality. Hepatobiliary: No cholelithiasis or biliary dilatation is noted. Stable right hepatic cyst. Pancreas: Unremarkable. No pancreatic ductal dilatation or surrounding inflammatory changes. Spleen: Normal in size without focal abnormality. Adrenals/Urinary Tract: Adrenal glands appear normal. Nonobstructive renal cyst is noted. No hydronephrosis or renal obstruction is noted. Urinary bladder is unremarkable. Stomach/Bowel: Stomach and appendix are unremarkable. Severe proximal small bowel dilatation is noted with transition  zone seen in central portion of abdomen on image number 34 series 2, consistent with small bowel obstruction, potentially due to adhesion. Sigmoid diverticulosis without inflammation. Stool is noted in the nondilated colon. Vascular/Lymphatic: Aortic atherosclerosis. No enlarged abdominal or pelvic lymph nodes. Reproductive: Mild prostatic enlargement. Other: Small fat and fluid-filled left inguinal hernia is noted. No ascites is noted. Musculoskeletal: No acute or significant osseous findings. IMPRESSION: 1. Severe proximal small bowel dilatation is noted with transition zone seen in central portion of abdomen, consistent with small bowel obstruction, potentially due to adhesion. 2. Sigmoid diverticulosis without inflammation. 3. Mild prostatic enlargement. 4. Small fat and fluid-filled left inguinal hernia. 5. Aortic atherosclerosis. Aortic Atherosclerosis (ICD10-I70.0). Electronically Signed   By: Lynwood Landy Raddle M.D.   On: 06/22/2024 11:21   EKG: Independently reviewed.  Sinus rhythm at 96 bpm.  PVCs noted.  Nonspecific T wave changes.  Assessment/Plan Active Problems:   Benign essential HTN   PVC's (premature ventricular contractions)   Coronary arteriosclerosis   HLD (hyperlipidemia)   SBO (small bowel obstruction) (HCC)   SBO > Patient presenting with nausea vomiting abdominal pain.  No bowel movements.  Symptoms for about 3 days. > CT with evidence of SBO.  Patient received Zofran  and fluids in the ED.  NG tube placed. > History of diverticulitis per chart.  Old problem indicates may have been complicated by abscess in the past.  No surgical history per chart. > EDP is reaching out to general surgery for consult - Monitor on telemetry - Appreciate general surgery recommendations assistance - Continue NG tube - Small bowel protocol - N.p.o. except sips with meds and ice chips - IV fluids - Supportive care  Hypertension - Continue metoprolol  as able  Hyperlipidemia - Continue home  Zetia   CAD > Status post stenting - Continue Zetia , metoprolol  - Holding Plavix  for now in case patient progresses to requiring surgery  PVCs > History of symptomatic PVCs. - Continue metoprolol  - Will be due for next amiodarone  dose on Monday  DVT prophylaxis: SCDs for now Code Status:   Full Family Communication:  None on admission  Disposition Plan:   Patient is from:  Home  Anticipated DC to:  Home  Anticipated DC  date:  2 to 4 days  Anticipated DC barriers: None  Consults called:  Neurosurgery Admission status:  Patient, telemetry  Severity of Illness: The appropriate patient status for this patient is INPATIENT. Inpatient status is judged to be reasonable and necessary in order to provide the required intensity of service to ensure the patient's safety. The patient's presenting symptoms, physical exam findings, and initial radiographic and laboratory data in the context of their chronic comorbidities is felt to place them at high risk for further clinical deterioration. Furthermore, it is not anticipated that the patient will be medically stable for discharge from the hospital within 2 midnights of admission.   * I certify that at the point of admission it is my clinical judgment that the patient will require inpatient hospital care spanning beyond 2 midnights from the point of admission due to high intensity of service, high risk for further deterioration and high frequency of surveillance required.Franklin Marsa KATHEE Seena MD [facility]  How to contact the [facility] Attending or Consulting provider 7A - 7P or covering provider during after hours 7P -7A, for this patient?   Check the care team in [facility] and look for a) attending/consulting [facility] provider listed and b) the [facility] team listed Log into [email or web address] and use [place]'s universal password to access. If you do not have the password, please contact the hospital operator. Locate the [facility] provider you are looking for under  [facility] and page to a number that you can be directly reached. If you still have difficulty reaching the provider, please page the [facility] (Director on Call) for the Hospitalists listed on amion for assistance.  06/22/2024, 2:14 PM       [1]  Allergies Allergen Reactions   Bee Venom Hives   Erythromycin Other (See Comments)   Tetracyclines & Related Rash   "

## 2024-06-22 NOTE — ED Triage Notes (Signed)
 Reports difficulty keeping food down since Wednesday and no bm since there.  Denies pain just belching and unabel to keep water down.

## 2024-06-22 NOTE — ED Notes (Addendum)
 On monitor ccmd

## 2024-06-22 NOTE — ED Notes (Signed)
 NG tube clamped after gastrografin admin, to be restarted on suction in 1hr. 6C RN made aware

## 2024-06-22 NOTE — ED Notes (Signed)
 Patient o2 sat only rose to 94% at 2Lpm on [place] upped o2 to 3 LPM

## 2024-06-22 NOTE — ED Provider Triage Note (Signed)
 Emergency Medicine Provider Triage Evaluation Note  Franklin Ward , a 79 y.o. male  was evaluated in triage.  Pt complains of intolerance to p.o. intake over the last 2 days that is worsened, also states that he has not had a bowel movement in the same interval.  No abdominal pain presently, does have increased belching as well as previously mentioned nausea and vomiting, inability to tolerate solid and liquid intake.  No previous intra-abdominal surgeries however does have a history of sigmoid diverticulitis.  Review of Systems  Positive: As above Negative:   Physical Exam  BP (!) 165/98 (BP Location: Left Arm)   Pulse 99   Temp (!) 97.4 F (36.3 C)   Resp 18   Ht 5' 8 (1.727 m)   Wt 77.8 kg   SpO2 97%   BMI 26.08 kg/m  Gen:   Awake, no distress   Resp:  Normal effort  MSK:   Moves extremities without difficulty  Other:  Mildly tender across the abdomen, poor skin turgor, otherwise no concerning findings on physical exam  Medical Decision Making  Medically screening exam initiated at 9:56 AM.  Appropriate orders placed.  Franklin Ward was informed that the remainder of the evaluation will be completed by another provider, this initial triage assessment does not replace that evaluation, and the importance of remaining in the ED until their evaluation is complete.  Initial imaging and labs obtained.   Myriam Dorn BROCKS, [place] 06/22/24 1037

## 2024-06-22 NOTE — Consult Note (Signed)
 "       Consult Note  Franklin Ward 08/20/1945  [id number].    Requesting MD:  Dr. Marsa Scurry, MD  Chief Complaint/Reason for Consult:  Abdominal pain with concerns of small bowel obstruction  HPI:  Franklin Ward is a 79 year old male with a PMH of HTN, HLD, CAD status post stent placement, PVCs, and hx of diverticulitis that presented to the ED due to abdominal tightness. Patient reports that the pain began 3 days ago. States he ate rice, beans, and spicy peppers on Tuesday morning and by the evening had worsening abdominal distention. He vomited multiple times early Wednesday morning around 0100, then was able to get some rest. States he has had progressive abdominal distention that he can temporarily relieve by self-inducing vomiting. Unable to tolerate PO. His last BM was Wednesday morning and was normal.   Labs in the ED -Lipase 24 -Istat chem 8 abnormalities included BUN 46, glucose 108, HGB 18, and HCT 53 -CMP abnormalities include sodium 134, chloride 96, gluocse 105, BUN 46, AST 50, Anion gap 17 -CBC abnormalities HGB 17.3 -Troponin 70 -Troponin 62  CT imaging showed severe proximal small bowel dilatation is noted with transition zone seen in central portion of abdomen, consistent with small bowel obstruction, potentially due to adhesion. Sigmoid diverticulosis without inflammation. Mild prostatic enlargement. Small fat and fluid-filled left inguinal hernia. Aortic atherosclerosis.  General surgery was asked to consult.  Surgical history: denies history of abdominal surgery. Denies history of upper endoscopy or PUD.  Tobacco use: former smoker Blood Thinners: Plavix  due to history of cardiac stents 2020 At baseline the patient states he lives at home with his wife who has dementia. He also has a daughter in town.   ROS: Review of Systems  All other systems reviewed and are negative.   Family History  Problem Relation Age of Onset   Lung cancer Father     Colon cancer Neg Hx     Past Medical History:  Diagnosis Date   Coronary artery disease    Diverticulitis    NSTEMI (non-ST elevated myocardial infarction) (HCC) 08/09/2018    Past Surgical History:  Procedure Laterality Date   CORONARY STENT INTERVENTION  08/10/2018   CORONARY STENT INTERVENTION N/A 08/10/2018   Procedure: CORONARY STENT INTERVENTION;  Surgeon: Jordan, Peter M, MD;  Location: [facility] INVASIVE CV LAB;  Service: Cardiovascular;  Laterality: N/A;   LEFT HEART CATH AND CORONARY ANGIOGRAPHY N/A 08/10/2018   Procedure: LEFT HEART CATH AND CORONARY ANGIOGRAPHY;  Surgeon: Jordan, Peter M, MD;  Location: [facility] INVASIVE CV LAB;  Service: Cardiovascular;  Laterality: N/A;    Social History:  reports that he has quit smoking. He has quit using smokeless tobacco.  His smokeless tobacco use included chew. He reports that he does not drink alcohol and does not use drugs.  Allergies: Allergies[1]  (Not in a hospital admission)   Blood pressure (!) 180/83, pulse 87, temperature 97.6 F (36.4 C), temperature source Oral, resp. rate 18, height 5' 8 (1.727 m), weight 77.8 kg, SpO2 95%. Physical Exam:  General: pleasant, WD,  male who is laying in bed in NAD HEENT: head is normocephalic, atraumatic.  Sclera are noninjected.  PERRL.  Heart: regular, rate, and rhythm.mild lower extremity edema  Lungs: normal effort on room air Abd: soft, mild distention, NT, ND no masses, hernias, or organomegaly  NG tube recently placed, has not been hooked to suction, I placed to wall suction with return of 600 mL feculent  appearing effluent in < 5 minutes.  MS: all 4 extremities are symmetrical  Skin: warm and dry with no masses, lesions, or rashes Neuro: Cranial nerves 2-12 grossly intact, sensation is normal throughout Psych: A&Ox3 with an appropriate affect.   Results for orders placed or performed during the hospital encounter of 06/22/24 (from the past 48 hours)  Lipase, blood     Status: None    Collection Time: 06/22/24  5:53 AM  Result Value Ref Range   Lipase 24 11 - 51 U/L    Comment: Performed at [facility], [address], [place], [place] [address]  Comprehensive metabolic panel     Status: Abnormal   Collection Time: 06/22/24  5:53 AM  Result Value Ref Range   Sodium 134 (L) 135 - 145 mmol/L   Potassium 4.5 3.5 - 5.1 mmol/L   Chloride 96 (L) 98 - 111 mmol/L   CO2 22 22 - 32 mmol/L   Glucose, Bld 105 (H) 70 - 99 mg/dL    Comment: Glucose reference range applies only to samples taken after fasting for at least 8 hours.   BUN 46 (H) 8 - 23 mg/dL   Creatinine, Ser 8.82 0.61 - 1.24 mg/dL   Calcium  10.2 8.9 - 10.3 mg/dL   Total Protein 7.8 6.5 - 8.1 g/dL   Albumin 4.4 3.5 - 5.0 g/dL   AST 50 (H) 15 - 41 U/L    Comment: HEMOLYSIS AT THIS LEVEL MAY AFFECT RESULT   ALT 23 0 - 44 U/L   Alkaline Phosphatase 55 38 - 126 U/L   Total Bilirubin 1.2 0.0 - 1.2 mg/dL   GFR, Estimated >39 >39 mL/min    Comment: (NOTE) Calculated using the CKD-EPI Creatinine Equation (2021)    Anion gap 17 (H) 5 - 15    Comment: Performed at [facility], [address], [place], [place] [address]  CBC with Differential     Status: Abnormal   Collection Time: 06/22/24  5:53 AM  Result Value Ref Range   WBC 6.4 4.0 - 10.5 K/uL   RBC 5.50 4.22 - 5.81 MIL/uL   Hemoglobin 17.3 (H) 13.0 - 17.0 g/dL   HCT 50.1 60.9 - 47.9 %   MCV 90.5 80.0 - 100.0 fL   MCH 31.5 26.0 - 34.0 pg   MCHC 34.7 30.0 - 36.0 g/dL   RDW 86.5 88.4 - 84.4 %   Platelets 257 150 - 400 K/uL   nRBC 0.0 0.0 - 0.2 %   Neutrophils Relative % 56 %   Neutro Abs 3.6 1.7 - 7.7 K/uL   Lymphocytes Relative 27 %   Lymphs Abs 1.7 0.7 - 4.0 K/uL   Monocytes Relative 16 %   Monocytes Absolute 1.0 0.1 - 1.0 K/uL   Eosinophils Relative 1 %   Eosinophils Absolute 0.0 0.0 - 0.5 K/uL   Basophils Relative 0 %   Basophils Absolute 0.0 0.0 - 0.1 K/uL   Immature Granulocytes 0 %   Abs Immature Granulocytes 0.01 0.00 - 0.07 K/uL     Comment: Performed at [facility], [address], [place], [place] [address]  Troponin T, High Sensitivity     Status: Abnormal   Collection Time: 06/22/24  5:53 AM  Result Value Ref Range   Troponin T High Sensitivity 70 (H) 0 - 19 ng/L    Comment: (NOTE) Biotin concentrations > 1000 ng/mL falsely decrease TnT results.  Serial cardiac troponin measurements are suggested.  Refer to the Links section for chest pain algorithms and additional  guidance. Performed at [facility], [address] [place], [place] [address]   I-stat chem 8, ED (not at [facility], [facility] or [facility])     Status: Abnormal   Collection Time: 06/22/24  8:57 AM  Result Value Ref Range   Sodium 136 135 - 145 mmol/L   Potassium 4.4 3.5 - 5.1 mmol/L   Chloride 101 98 - 111 mmol/L   BUN 46 (H) 8 - 23 mg/dL   Creatinine, Ser 8.79 0.61 - 1.24 mg/dL   Glucose, Bld 891 (H) 70 - 99 mg/dL    Comment: Glucose reference range applies only to samples taken after fasting for at least 8 hours.   Calcium , Ion 1.15 1.15 - 1.40 mmol/L   TCO2 23 22 - 32 mmol/L   Hemoglobin 18.0 (H) 13.0 - 17.0 g/dL   HCT 46.9 (H) 60.9 - 47.9 %  Troponin T, High Sensitivity     Status: Abnormal   Collection Time: 06/22/24 10:37 AM  Result Value Ref Range   Troponin T High Sensitivity 62 (H) 0 - 19 ng/L    Comment: (NOTE) Biotin concentrations > 1000 ng/mL falsely decrease TnT results.  Serial cardiac troponin measurements are suggested.  Refer to the Links section for chest pain algorithms and additional  guidance. Performed at [facility], [address] [place], [place] [address]    CT ABDOMEN PELVIS W CONTRAST Result Date: 06/22/2024 CLINICAL DATA:  Bowel obstruction suspected EXAM: CT ABDOMEN AND PELVIS WITH CONTRAST TECHNIQUE: Multidetector CT imaging of the abdomen and pelvis was performed using the standard protocol following bolus administration of intravenous contrast. RADIATION DOSE REDUCTION: This exam was  performed according to the departmental dose-optimization program which includes automated exposure control, adjustment of the mA and/or kV according to patient size and/or use of iterative reconstruction technique. CONTRAST:  75mL OMNIPAQUE  IOHEXOL  350 MG/ML SOLN COMPARISON:  July 06, 2016 FINDINGS: Lower chest: No acute abnormality. Hepatobiliary: No cholelithiasis or biliary dilatation is noted. Stable right hepatic cyst. Pancreas: Unremarkable. No pancreatic ductal dilatation or surrounding inflammatory changes. Spleen: Normal in size without focal abnormality. Adrenals/Urinary Tract: Adrenal glands appear normal. Nonobstructive renal cyst is noted. No hydronephrosis or renal obstruction is noted. Urinary bladder is unremarkable. Stomach/Bowel: Stomach and appendix are unremarkable. Severe proximal small bowel dilatation is noted with transition zone seen in central portion of abdomen on image number 34 series 2, consistent with small bowel obstruction, potentially due to adhesion. Sigmoid diverticulosis without inflammation. Stool is noted in the nondilated colon. Vascular/Lymphatic: Aortic atherosclerosis. No enlarged abdominal or pelvic lymph nodes. Reproductive: Mild prostatic enlargement. Other: Small fat and fluid-filled left inguinal hernia is noted. No ascites is noted. Musculoskeletal: No acute or significant osseous findings. IMPRESSION: 1. Severe proximal small bowel dilatation is noted with transition zone seen in central portion of abdomen, consistent with small bowel obstruction, potentially due to adhesion. 2. Sigmoid diverticulosis without inflammation. 3. Mild prostatic enlargement. 4. Small fat and fluid-filled left inguinal hernia. 5. Aortic atherosclerosis. Aortic Atherosclerosis (ICD10-I70.0). Electronically Signed   By: Lynwood Landy Raddle M.D.   On: 06/22/2024 11:21    Assessment/Plan SBO  - 79 y/o M with no prior history of abdominal surgery. History, labs, imaging have all been  personally reviewed. 3 days of abdominal distention and vomiting. Last flatus yesterday. Last BM 2 days ago. He is hemodynamically stable with HR 80's and BP 180 mmhg systolic. CT scan with  SBO and transition point in central abdomen. There is a fat containing inguinal hernia and diverticulosis. CBC and CMP overall unremarkable (normal WBC, normal renal function). Troponin 60 from 72.   No emergent role for surgery. Unclear etiology of SBO. NGT to LIWS and decompress, followed by SBO protocol.  CCS will follow Please hold Plavix  for now.     I reviewed nursing notes, ED provider notes, hospitalist notes, last 24 h vitals and pain scores, last 48 h intake and output, last 24 h labs and trends, and last 24 h imaging results.  This care required moderate level of medical decision making.   Marjorie Carlyon Favre, [facility] Surgery 06/22/2024, 2:53 PM Please see Amion for pager number during day hours 7:00am-4:30pm       [1]  Allergies Allergen Reactions   Bee Venom Hives and Anaphylaxis   Erythromycin Rash   Tetracyclines & Related Rash   "

## 2024-06-23 ENCOUNTER — Inpatient Hospital Stay (HOSPITAL_COMMUNITY)

## 2024-06-23 DIAGNOSIS — K56609 Unspecified intestinal obstruction, unspecified as to partial versus complete obstruction: Secondary | ICD-10-CM

## 2024-06-23 LAB — COMPREHENSIVE METABOLIC PANEL WITH GFR
ALT: 23 U/L (ref 0–44)
AST: 41 U/L (ref 15–41)
Albumin: 3.8 g/dL (ref 3.5–5.0)
Alkaline Phosphatase: 53 U/L (ref 38–126)
Anion gap: 13 (ref 5–15)
BUN: 46 mg/dL — ABNORMAL HIGH (ref 8–23)
CO2: 25 mmol/L (ref 22–32)
Calcium: 9.3 mg/dL (ref 8.9–10.3)
Chloride: 100 mmol/L (ref 98–111)
Creatinine, Ser: 1.09 mg/dL (ref 0.61–1.24)
GFR, Estimated: >60 mL/min
Glucose, Bld: 75 mg/dL (ref 70–99)
Potassium: 4.4 mmol/L (ref 3.5–5.1)
Sodium: 138 mmol/L (ref 135–145)
Total Bilirubin: 0.8 mg/dL (ref 0.0–1.2)
Total Protein: 6.9 g/dL (ref 6.5–8.1)

## 2024-06-23 LAB — CBC
HCT: 46.9 % (ref 39.0–52.0)
Hemoglobin: 16.1 g/dL (ref 13.0–17.0)
MCH: 31.8 pg (ref 26.0–34.0)
MCHC: 34.3 g/dL (ref 30.0–36.0)
MCV: 92.7 fL (ref 80.0–100.0)
Platelets: 223 K/uL (ref 150–400)
RBC: 5.06 MIL/uL (ref 4.22–5.81)
RDW: 13.7 % (ref 11.5–15.5)
WBC: 6.7 K/uL (ref 4.0–10.5)
nRBC: 0 % (ref 0.0–0.2)

## 2024-06-23 MED ORDER — PHENOL 1.4 % MT LIQD
1.0000 | OROMUCOSAL | Status: AC | PRN
  Administered 2024-06-23 – 2024-06-24 (×4): 1 via OROMUCOSAL
  Filled 2024-06-23: qty 177

## 2024-06-23 MED ORDER — SODIUM CHLORIDE 0.9 % IV SOLN: INTRAVENOUS | Status: AC

## 2024-06-23 NOTE — Progress Notes (Signed)
 " PROGRESS NOTE    Franklin Ward  FMW:[id number] DOB: 26-May-1946 DOA: 06/22/2024 PCP: Arloa Elsie JONELLE, MD   Brief Narrative:  HPI: Franklin Ward is a 79 y.o. male with medical history significant of hypertension, hyperlipidemia, CAD status post stent, PVCs, diverticulosis with history of diverticulitis and abscess presenting with nausea, vomiting, abdominal pain.   Patient states that he had symptoms starting a 3 days ago.  Has had an persistent abdominal pain with nausea vomiting and inability to tolerate p.o. intake.  No bowel movements in that time.   Denies fevers, chills, chest pain, shortness of breath.   ED Course: Vital signs in the ED notable for blood pressure in the 160s-170s systolic.   Lab workup included CMP with sodium 134, glucose 105, chloride 96, BUN 46, normal bicarb but gap 17, AST 50.  CBC within normal limits.  Troponin trend flat at 70, 62.  Lipase normal.  Urinalysis pending.   CT abdomen pelvis showed severe proximal small bowel dilation with transition zone consistent with SBO.  Diverticulosis without diverticulitis.  Mild prostatic enlargement, left inguinal hernia.   Patient received 1 L IV fluids, Zofran  in the ED.  NG tube placed.  EDP will consult general surgery for evaluation.  Assessment & Plan:   Principal Problem:   SBO (small bowel obstruction) (HCC) Active Problems:   Benign essential HTN   PVC's (premature ventricular contractions)   Coronary arteriosclerosis   HLD (hyperlipidemia)  SBO Confirmed with abdominal series.  General surgery managing.  Has NG tube with suction.  Still not passing flatus.  Appreciate general surgery managing.   Hypertension - Blood pressure fairly controlled, continue metoprolol  as able   Hyperlipidemia - Continue home Zetia    CAD > Status post stenting - Continue Zetia , metoprolol  - Holding Plavix  for now in case patient progresses to requiring surgery   PVCs > History of symptomatic PVCs. -  Continue metoprolol  and amiodarone .  DVT prophylaxis: SCDs Start: 06/22/24 1406   Code Status: Full Code  Family Communication:  None present at bedside.  Plan of care discussed with patient in length and he/she verbalized understanding and agreed with it.  Status is: Inpatient Remains inpatient appropriate because: Still with SBO.   Estimated body mass index is 26.08 kg/m as calculated from the following:   Height as of this encounter: 5' 8 (1.727 m).   Weight as of this encounter: 77.8 kg.    Nutritional Assessment: Body mass index is 26.08 kg/m.SABRA Seen by dietician.  I agree with the assessment and plan as outlined below: Nutrition Status:        . Skin Assessment: I have examined the patient's skin and I agree with the wound assessment as performed by the wound care RN as outlined below:    Consultants:  General surgery  Procedures:  As above  Antimicrobials:  Anti-infectives (From admission, onward)    None         Subjective: Patient seen and examined, denies any abdominal pain or nausea but still not passing flatus.  Objective: Vitals:   06/22/24 2115 06/22/24 2140 06/23/24 0359 06/23/24 0827  BP: (!) 173/95 (!) 163/56 (!) 166/80 (!) 140/57  Pulse: 82  85 62  Resp: 18  17 18   Temp: 98 F (36.7 C)  98.9 F (37.2 C) (!) 97.5 F (36.4 C)  TempSrc: Oral  Oral Oral  SpO2: 98%  94% 94%  Weight:      Height:  Intake/Output Summary (Last 24 hours) at 06/23/2024 1200 Last data filed at 06/23/2024 1128 Gross per 24 hour  Intake 2477.37 ml  Output 1925 ml  Net 552.37 ml   Filed Weights   06/22/24 0855  Weight: 77.8 kg    Examination:  General exam: Appears calm and comfortable  Respiratory system: Clear to auscultation. Respiratory effort normal. Cardiovascular system: S1 & S2 heard, RRR. No JVD, murmurs, rubs, gallops or clicks. No pedal edema. Gastrointestinal system: Abdomen is slightly distended, soft and nontender. No organomegaly or  masses felt.  Sluggish bowel sounds. Central nervous system: Alert and oriented. No focal neurological deficits. Extremities: Symmetric 5 x 5 power. Skin: No rashes, lesions or ulcers Psychiatry: Judgement and insight appear normal. Mood & affect appropriate.    Data Reviewed: I have personally reviewed following labs and imaging studies  CBC: Recent Labs  Lab 06/22/24 0553 06/22/24 0857 06/23/24 0846  WBC 6.4  --  6.7  NEUTROABS 3.6  --   --   HGB 17.3* 18.0* 16.1  HCT 49.8 53.0* 46.9  MCV 90.5  --  92.7  PLT 257  --  223   Basic Metabolic Panel: Recent Labs  Lab 06/22/24 0553 06/22/24 0857 06/23/24 0846  NA 134* 136 138  K 4.5 4.4 4.4  CL 96* 101 100  CO2 22  --  25  GLUCOSE 105* 108* 75  BUN 46* 46* 46*  CREATININE 1.17 1.20 1.09  CALCIUM  10.2  --  9.3   GFR: Estimated Creatinine Clearance: 53.2 mL/min (by C-G formula based on SCr of 1.09 mg/dL). Liver Function Tests: Recent Labs  Lab 06/22/24 0553 06/23/24 0846  AST 50* 41  ALT 23 23  ALKPHOS 55 53  BILITOT 1.2 0.8  PROT 7.8 6.9  ALBUMIN 4.4 3.8   Recent Labs  Lab 06/22/24 0553  LIPASE 24   No results for input(s): AMMONIA in the last 168 hours. Coagulation Profile: No results for input(s): INR, PROTIME in the last 168 hours. Cardiac Enzymes: No results for input(s): CKTOTAL, CKMB, CKMBINDEX, TROPONINI in the last 168 hours. BNP (last 3 results) No results for input(s): PROBNP in the last 8760 hours. HbA1C: No results for input(s): HGBA1C in the last 72 hours. CBG: No results for input(s): GLUCAP in the last 168 hours. Lipid Profile: No results for input(s): CHOL, HDL, LDLCALC, TRIG, CHOLHDL, LDLDIRECT in the last 72 hours. Thyroid  Function Tests: No results for input(s): TSH, T4TOTAL, FREET4, T3FREE, THYROIDAB in the last 72 hours. Anemia Panel: No results for input(s): VITAMINB12, FOLATE, FERRITIN, TIBC, IRON, RETICCTPCT in the last 72  hours. Sepsis Labs: No results for input(s): PROCALCITON, LATICACIDVEN in the last 168 hours.  No results found for this or any previous visit (from the past 240 hours).   Radiology Studies: DG Abd Portable 1V-Small Bowel Obstruction Protocol-initial, 8 hr delay Result Date: 06/23/2024 EXAM: 1 VIEW XRAY OF THE ABDOMEN 06/23/2024 04:51:55 AM COMPARISON: 06/22/2024 CLINICAL HISTORY: FINDINGS: LINES, TUBES AND DEVICES: Enteric tube partially included in the field of view, tip in stomach. BOWEL: Dilute contrast within persistently dilated small bowel. SOFT TISSUES: Vascular calcifications. BONES: No acute fracture. IMPRESSION: 1. Persistently dilated small bowel with dilute contrast, concerning for ongoing small bowel obstruction. 2. Enteric tube tip projects over the stomach. Electronically signed by: Waddell Calk MD 06/23/2024 05:19 AM EST RP Workstation: [id number]   DG Abd Portable 1 View Result Date: 06/22/2024 EXAM: 1 VIEW XRAY OF THE ABDOMEN 06/22/2024 02:27:00 PM COMPARISON: CT abdomen and pelvis 06/22/2024,  chest x-ray 03/22/2024 CLINICAL HISTORY: NG tube Placement FINDINGS: LINES, TUBES AND DEVICES: Enteric tube in place with tip and side-port in the stomach. BOWEL: Nonobstructive bowel gas pattern. SOFT TISSUES: Atherosclerotic plaque. BONES: No acute fracture. LUNGS: Blunting of bilateral costophrenic angles due to paracardial fat and scarring. No pleural effusion noted on the CT abdomen and pelvis. Lung apices collimated off view. IMPRESSION: 1. Enteric tube in place. 2. Blunting of bilateral costophrenic angles due to paracardial fat and scarring. No pleural effusion noted on the CT abdomen and pelvis. Electronically signed by: Morgane Naveau MD 06/22/2024 03:30 PM EST RP Workstation: [id number]   CT ABDOMEN PELVIS W CONTRAST Result Date: 06/22/2024 CLINICAL DATA:  Bowel obstruction suspected EXAM: CT ABDOMEN AND PELVIS WITH CONTRAST TECHNIQUE: Multidetector CT imaging of the abdomen and  pelvis was performed using the standard protocol following bolus administration of intravenous contrast. RADIATION DOSE REDUCTION: This exam was performed according to the departmental dose-optimization program which includes automated exposure control, adjustment of the mA and/or kV according to patient size and/or use of iterative reconstruction technique. CONTRAST:  75mL OMNIPAQUE  IOHEXOL  350 MG/ML SOLN COMPARISON:  July 06, 2016 FINDINGS: Lower chest: No acute abnormality. Hepatobiliary: No cholelithiasis or biliary dilatation is noted. Stable right hepatic cyst. Pancreas: Unremarkable. No pancreatic ductal dilatation or surrounding inflammatory changes. Spleen: Normal in size without focal abnormality. Adrenals/Urinary Tract: Adrenal glands appear normal. Nonobstructive renal cyst is noted. No hydronephrosis or renal obstruction is noted. Urinary bladder is unremarkable. Stomach/Bowel: Stomach and appendix are unremarkable. Severe proximal small bowel dilatation is noted with transition zone seen in central portion of abdomen on image number 34 series 2, consistent with small bowel obstruction, potentially due to adhesion. Sigmoid diverticulosis without inflammation. Stool is noted in the nondilated colon. Vascular/Lymphatic: Aortic atherosclerosis. No enlarged abdominal or pelvic lymph nodes. Reproductive: Mild prostatic enlargement. Other: Small fat and fluid-filled left inguinal hernia is noted. No ascites is noted. Musculoskeletal: No acute or significant osseous findings. IMPRESSION: 1. Severe proximal small bowel dilatation is noted with transition zone seen in central portion of abdomen, consistent with small bowel obstruction, potentially due to adhesion. 2. Sigmoid diverticulosis without inflammation. 3. Mild prostatic enlargement. 4. Small fat and fluid-filled left inguinal hernia. 5. Aortic atherosclerosis. Aortic Atherosclerosis (ICD10-I70.0). Electronically Signed   By: Lynwood Landy Raddle M.D.    On: 06/22/2024 11:21    Scheduled Meds:  [START ON 06/25/2024] amiodarone   200 mg Oral Once per day on Monday Tuesday Wednesday Thursday Friday   ezetimibe   10 mg Oral Daily   metoprolol  tartrate  5 mg Intravenous Q8H   sodium chloride  flush  3 mL Intravenous Q12H   Continuous Infusions:  sodium chloride  75 mL/hr at 06/23/24 1128     LOS: 1 day   Fredia Skeeter, MD [facility]  06/23/2024, 12:00 PM   *Please note that this is a verbal dictation therefore any spelling or grammatical errors are due to the Dragon Medical One system interpretation.  Please page via Amion and do not message via secure chat for urgent patient care matters. Secure chat can be used for non urgent patient care matters.  How to contact the [facility] Attending or Consulting provider 7A - 7P or covering provider during after hours 7P -7A, for this patient?  Check the care team in [facility] and look for a) attending/consulting [facility] provider listed and b) the [facility] team listed. Page or secure chat 7A-7P. Log into [email or web address] and use [place]'s universal password to access. If you do  not have the password, please contact the hospital operator. Locate the [facility] provider you are looking for under [facility] and page to a number that you can be directly reached. If you still have difficulty reaching the provider, please page the [facility] (Director on Call) for the Hospitalists listed on amion for assistance.  "

## 2024-06-23 NOTE — Progress Notes (Signed)
 "      Subjective: Feeling better today with no pain, but no flatus or BM.  NGT currently clamped for medication, but minimal output currently, but 1200cc documented yesterday.      ROS: See above, otherwise other systems negative  Objective: Vital signs in last 24 hours: Temp:  [97.5 F (36.4 C)-98.9 F (37.2 C)] 97.5 F (36.4 C) (01/03 0827) Pulse Rate:  [58-89] 62 (01/03 0827) Resp:  [17-20] 18 (01/03 0827) BP: (137-180)/(56-96) 140/57 (01/03 0827) SpO2:  [93 %-100 %] 94 % (01/03 0827)    Intake/Output from previous day: 01/02 0701 - 01/03 0700 In: 2087.3 [I.V.:2087.3] Out: 1500 [Urine:300; Emesis/NG output:1200] Intake/Output this shift: Total I/O In: 130 [I.V.:130] Out: 425 [Emesis/NG output:425]  PE: Gen: NAD Abd: soft, NT, mild distention, NGT with minimal bilious output currently, but clamped due to recent oral medications administration.  Lab Results:  Recent Labs    06/22/24 0553 06/22/24 0857  WBC 6.4  --   HGB 17.3* 18.0*  HCT 49.8 53.0*  PLT 257  --    BMET Recent Labs    06/22/24 0553 06/22/24 0857  NA 134* 136  K 4.5 4.4  CL 96* 101  CO2 22  --   GLUCOSE 105* 108*  BUN 46* 46*  CREATININE 1.17 1.20  CALCIUM  10.2  --    PT/INR No results for input(s): LABPROT, INR in the last 72 hours. CMP     Component Value Date/Time   NA 136 06/22/2024 0857   NA 137 03/22/2024 0955   K 4.4 06/22/2024 0857   CL 101 06/22/2024 0857   CO2 22 06/22/2024 0553   GLUCOSE 108 (H) 06/22/2024 0857   BUN 46 (H) 06/22/2024 0857   BUN 16 03/22/2024 0955   CREATININE 1.20 06/22/2024 0857   CALCIUM  10.2 06/22/2024 0553   PROT 7.8 06/22/2024 0553   PROT 7.2 03/22/2024 0955   ALBUMIN 4.4 06/22/2024 0553   ALBUMIN 4.6 03/22/2024 0955   AST 50 (H) 06/22/2024 0553   ALT 23 06/22/2024 0553   ALKPHOS 55 06/22/2024 0553   BILITOT 1.2 06/22/2024 0553   BILITOT 0.3 03/22/2024 0955   GFRNONAA >60 06/22/2024 0553   GFRAA 104 08/21/2018 1311   Lipase      Component Value Date/Time   LIPASE 24 06/22/2024 0553       Studies/Results: DG Abd Portable 1V-Small Bowel Obstruction Protocol-initial, 8 hr delay Result Date: 06/23/2024 EXAM: 1 VIEW XRAY OF THE ABDOMEN 06/23/2024 04:51:55 AM COMPARISON: 06/22/2024 CLINICAL HISTORY: FINDINGS: LINES, TUBES AND DEVICES: Enteric tube partially included in the field of view, tip in stomach. BOWEL: Dilute contrast within persistently dilated small bowel. SOFT TISSUES: Vascular calcifications. BONES: No acute fracture. IMPRESSION: 1. Persistently dilated small bowel with dilute contrast, concerning for ongoing small bowel obstruction. 2. Enteric tube tip projects over the stomach. Electronically signed by: Waddell Calk MD 06/23/2024 05:19 AM EST RP Workstation: [id number]   DG Abd Portable 1 View Result Date: 06/22/2024 EXAM: 1 VIEW XRAY OF THE ABDOMEN 06/22/2024 02:27:00 PM COMPARISON: CT abdomen and pelvis 06/22/2024, chest x-ray 03/22/2024 CLINICAL HISTORY: NG tube Placement FINDINGS: LINES, TUBES AND DEVICES: Enteric tube in place with tip and side-port in the stomach. BOWEL: Nonobstructive bowel gas pattern. SOFT TISSUES: Atherosclerotic plaque. BONES: No acute fracture. LUNGS: Blunting of bilateral costophrenic angles due to paracardial fat and scarring. No pleural effusion noted on the CT abdomen and pelvis. Lung apices collimated off view. IMPRESSION: 1. Enteric tube in place. 2. Blunting of  bilateral costophrenic angles due to paracardial fat and scarring. No pleural effusion noted on the CT abdomen and pelvis. Electronically signed by: Morgane Naveau MD 06/22/2024 03:30 PM EST RP Workstation: [id number]   CT ABDOMEN PELVIS W CONTRAST Result Date: 06/22/2024 CLINICAL DATA:  Bowel obstruction suspected EXAM: CT ABDOMEN AND PELVIS WITH CONTRAST TECHNIQUE: Multidetector CT imaging of the abdomen and pelvis was performed using the standard protocol following bolus administration of intravenous contrast. RADIATION  DOSE REDUCTION: This exam was performed according to the departmental dose-optimization program which includes automated exposure control, adjustment of the mA and/or kV according to patient size and/or use of iterative reconstruction technique. CONTRAST:  75mL OMNIPAQUE  IOHEXOL  350 MG/ML SOLN COMPARISON:  July 06, 2016 FINDINGS: Lower chest: No acute abnormality. Hepatobiliary: No cholelithiasis or biliary dilatation is noted. Stable right hepatic cyst. Pancreas: Unremarkable. No pancreatic ductal dilatation or surrounding inflammatory changes. Spleen: Normal in size without focal abnormality. Adrenals/Urinary Tract: Adrenal glands appear normal. Nonobstructive renal cyst is noted. No hydronephrosis or renal obstruction is noted. Urinary bladder is unremarkable. Stomach/Bowel: Stomach and appendix are unremarkable. Severe proximal small bowel dilatation is noted with transition zone seen in central portion of abdomen on image number 34 series 2, consistent with small bowel obstruction, potentially due to adhesion. Sigmoid diverticulosis without inflammation. Stool is noted in the nondilated colon. Vascular/Lymphatic: Aortic atherosclerosis. No enlarged abdominal or pelvic lymph nodes. Reproductive: Mild prostatic enlargement. Other: Small fat and fluid-filled left inguinal hernia is noted. No ascites is noted. Musculoskeletal: No acute or significant osseous findings. IMPRESSION: 1. Severe proximal small bowel dilatation is noted with transition zone seen in central portion of abdomen, consistent with small bowel obstruction, potentially due to adhesion. 2. Sigmoid diverticulosis without inflammation. 3. Mild prostatic enlargement. 4. Small fat and fluid-filled left inguinal hernia. 5. Aortic atherosclerosis. Aortic Atherosclerosis (ICD10-I70.0). Electronically Signed   By: Lynwood Landy Raddle M.D.   On: 06/22/2024 11:21    Anti-infectives: Anti-infectives (From admission, onward)    None         Assessment/Plan SBO -no previous abdominal pain -8-hr delay still with SBO and 1200cc output yesterday, but does look like there may be some faint contrast in his right colon -cont NGT today and order plain film in am to follow up on contrast progression in am.  Given no change clinically, will continue this treatment today as well -CBC stable today, CMET in process -will continue to follow  FEN - NPO, NGT, IVFs per primary  VTE - hold plavix , chemical prophylaxis ok from our standpoint ID - none currently  needed  CAD H/o NSTEMI diverticulosis  I reviewed hospitalist notes, last 24 h vitals and pain scores, last 48 h intake and output, last 24 h labs and trends, and last 24 h imaging results.   LOS: 1 day    Burnard FORBES Banter , [facility] Surgery 06/23/2024, 9:17 AM Please see Amion for pager number during day hours 7:00am-4:30pm or 7:00am -11:30am on weekends  "

## 2024-06-24 ENCOUNTER — Inpatient Hospital Stay (HOSPITAL_COMMUNITY)

## 2024-06-24 DIAGNOSIS — K56609 Unspecified intestinal obstruction, unspecified as to partial versus complete obstruction: Secondary | ICD-10-CM | POA: Diagnosis not present

## 2024-06-24 LAB — BASIC METABOLIC PANEL WITH GFR
Anion gap: 16 — ABNORMAL HIGH (ref 5–15)
BUN: 43 mg/dL — ABNORMAL HIGH (ref 8–23)
CO2: 22 mmol/L (ref 22–32)
Calcium: 8.8 mg/dL — ABNORMAL LOW (ref 8.9–10.3)
Chloride: 102 mmol/L (ref 98–111)
Creatinine, Ser: 0.98 mg/dL (ref 0.61–1.24)
GFR, Estimated: >60 mL/min
Glucose, Bld: 74 mg/dL (ref 70–99)
Potassium: 4.7 mmol/L (ref 3.5–5.1)
Sodium: 140 mmol/L (ref 135–145)

## 2024-06-24 LAB — MAGNESIUM: Magnesium: 2.3 mg/dL (ref 1.7–2.4)

## 2024-06-24 MED ORDER — HYDRALAZINE HCL 20 MG/ML IJ SOLN: 10.0000 mg | Freq: Four times a day (QID) | INTRAMUSCULAR | Status: DC | PRN

## 2024-06-24 MED ORDER — DIATRIZOATE MEGLUMINE & SODIUM 66-10 % PO SOLN
90.0000 mL | Freq: Once | ORAL | Status: AC
  Administered 2024-06-24: 90 mL via NASOGASTRIC
  Filled 2024-06-24: qty 90

## 2024-06-24 NOTE — Progress Notes (Signed)
 [facility]ol[facility] Pack [phone] [facility] Liaison note:   This is a pending outpatient based Palliative care patient with [facility].   Hospital liaison will continue to follow for discharge disposition.?   Please call for any outpatient based palliative care related questions or concerns.   Thank you,   Nat Babe, BSN, [facility] Liaison [phone]

## 2024-06-24 NOTE — Progress Notes (Signed)
 " PROGRESS NOTE    Franklin Ward  FMW:[id number] DOB: [birth year] DOA: 06/22/2024 PCP: Arloa Elsie JONELLE, MD   Brief Narrative:  Franklin Ward is a 79 y.o. male with medical history significant of hypertension, hyperlipidemia, CAD status post stent, PVCs, diverticulosis with history of diverticulitis and abscess presented with nausea, vomiting, abdominal pain for the last 3 days.  Denied any fever or chills, chest pain or shortness of breath.   Hemodynamically stable upon arrival to ED. CT abdomen pelvis showed severe proximal small bowel dilation with transition zone consistent with SBO.  Diverticulosis without diverticulitis.  General surgery consulted.   Assessment & Plan:   Principal Problem:   SBO (small bowel obstruction) (HCC) Active Problems:   Benign essential HTN   PVC's (premature ventricular contractions)   Coronary arteriosclerosis   HLD (hyperlipidemia)  SBO Confirmed with abdominal series.  24-hour delay film worse today with clinically increased abdominal distention and still no bowel function.  General surgery is planning to repeat SBO protocol today, if this confirms high-grade SBO, patient may need general surgery.  All of this has been discussed by general surgery with the patient in greater detail.  I personally discussed with general surgery outside patient's room as well.   Hypertension PTA only on metoprolol  50 mg twice daily which is on hold and currently he is on Lopressor  IV 5 mg 3 times daily but blood pressure still elevated so I will add as needed IV hydralazine .   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 - Continue metoprolol  and amiodarone .  DVT prophylaxis: SCDs Start: 06/22/24 1406   Code Status: Full Code  Family Communication:  None present at bedside.  Plan of care discussed  with patient in length and he/she verbalized understanding and agreed with it.  Status is: Inpatient Remains inpatient appropriate because: Still with SBO.   Estimated body mass index is 26.08 kg/m as calculated from the following:   Height as of this encounter: 5' 8 (1.727 m).   Weight as of this encounter: 77.8 kg.    Nutritional Assessment: Body mass index is 26.08 kg/m.SABRA Seen by dietician.  I agree with the assessment and plan as outlined below: Nutrition Status:        . Skin Assessment: I have examined the patient's skin and I agree with the wound assessment as performed by the wound care RN as outlined below:    Consultants:  General surgery  Procedures:  As above  Antimicrobials:  Anti-infectives (From admission, onward)    None         Subjective: Patient seen and examined.  Complains of increased abdominal distention and not passing flatus.  No nausea or ends of complaint.  Objective: Vitals:   06/24/24 0055 06/24/24 0531 06/24/24 0637 06/24/24 0740  BP: (!) 163/71 (!) 180/90 (!) 177/87 (!) 179/92  Pulse: 81 75  81  Resp: 16 19  16   Temp: 97.6 F (36.4 C) 97.6 F (36.4 C)  98.6 F (37 C)  TempSrc: Oral Oral  Oral  SpO2: 93% 93%  92%  Weight:      Height:        Intake/Output Summary (Last 24 hours) at 06/24/2024 1042 Last data filed at 06/24/2024 0700 Gross per 24 hour  Intake 1659.62 ml  Output 1800 ml  Net -140.38 ml  Filed Weights   06/22/24 0855  Weight: 77.8 kg    Examination:  General exam: Appears calm and comfortable  Respiratory system: Clear to auscultation. Respiratory effort normal. Cardiovascular system: S1 & S2 heard, RRR. No JVD, murmurs, rubs, gallops or clicks. No pedal edema. Gastrointestinal system: Abdomen is moderately distended but soft and nontender. No organomegaly or masses felt.  No bowel sounds. Central nervous system: Alert and oriented. No focal neurological deficits. Extremities: Symmetric 5 x 5  power. Skin: No rashes, lesions or ulcers.  Psychiatry: Judgement and insight appear normal. Mood & affect appropriate.   Data Reviewed: I have personally reviewed following labs and imaging studies  CBC: Recent Labs  Lab 06/22/24 0553 06/22/24 0857 06/23/24 0846  WBC 6.4  --  6.7  NEUTROABS 3.6  --   --   HGB 17.3* 18.0* 16.1  HCT 49.8 53.0* 46.9  MCV 90.5  --  92.7  PLT 257  --  223   Basic Metabolic Panel: Recent Labs  Lab 06/22/24 0553 06/22/24 0857 06/23/24 0846 06/24/24 0626  NA 134* 136 138 140  K 4.5 4.4 4.4 4.7  CL 96* 101 100 102  CO2 22  --  25 22  GLUCOSE 105* 108* 75 74  BUN 46* 46* 46* 43*  CREATININE 1.17 1.20 1.09 0.98  CALCIUM  10.2  --  9.3 8.8*  MG  --   --   --  2.3   GFR: Estimated Creatinine Clearance: 59.1 mL/min (by C-G formula based on SCr of 0.98 mg/dL). Liver Function Tests: Recent Labs  Lab 06/22/24 0553 06/23/24 0846  AST 50* 41  ALT 23 23  ALKPHOS 55 53  BILITOT 1.2 0.8  PROT 7.8 6.9  ALBUMIN 4.4 3.8   Recent Labs  Lab 06/22/24 0553  LIPASE 24   No results for input(s): AMMONIA in the last 168 hours. Coagulation Profile: No results for input(s): INR, PROTIME in the last 168 hours. Cardiac Enzymes: No results for input(s): CKTOTAL, CKMB, CKMBINDEX, TROPONINI in the last 168 hours. BNP (last 3 results) No results for input(s): PROBNP in the last 8760 hours. HbA1C: No results for input(s): HGBA1C in the last 72 hours. CBG: No results for input(s): GLUCAP in the last 168 hours. Lipid Profile: No results for input(s): CHOL, HDL, LDLCALC, TRIG, CHOLHDL, LDLDIRECT in the last 72 hours. Thyroid  Function Tests: No results for input(s): TSH, T4TOTAL, FREET4, T3FREE, THYROIDAB in the last 72 hours. Anemia Panel: No results for input(s): VITAMINB12, FOLATE, FERRITIN, TIBC, IRON, RETICCTPCT in the last 72 hours. Sepsis Labs: No results for input(s): PROCALCITON,  LATICACIDVEN in the last 168 hours.  No results found for this or any previous visit (from the past 240 hours).   Radiology Studies: DG Abd Portable 1V Result Date: 06/24/2024 EXAM: 1 VIEW XRAY OF THE ABDOMEN 06/24/2024 04:38:00 AM COMPARISON: 06/23/2024 CLINICAL HISTORY: SBO (small bowel obstruction) (HCC) FINDINGS: LINES, TUBES AND DEVICES: Enteric tube in place with tip and side port projecting over the stomach. BOWEL: Marked gaseous distension of several loops of small bowel in the central abdomen, up to 4.9 cm in diameter, increased from prior, consistent with small bowel obstruction. Mild gaseous distension of the stomach. SOFT TISSUES: No abnormal calcifications. BONES: No acute fracture. LUNGS AND PLEURAL SPACES: Mild bibasilar atelectasis. Bilateral pleural effusions. IMPRESSION: 1. Marked gaseous distension of several loops of small bowel in the central abdomen, up to 4.9 cm in diameter, increased from prior, consistent with small bowel obstruction. 2. Mild gaseous distension of  the stomach. Electronically signed by: Waddell Calk MD 06/24/2024 07:44 AM EST RP Workstation: [id number]   DG Abd Portable 1V-Small Bowel Obstruction Protocol-initial, 8 hr delay Result Date: 06/23/2024 EXAM: 1 VIEW XRAY OF THE ABDOMEN 06/23/2024 04:51:55 AM COMPARISON: 06/22/2024 CLINICAL HISTORY: FINDINGS: LINES, TUBES AND DEVICES: Enteric tube partially included in the field of view, tip in stomach. BOWEL: Dilute contrast within persistently dilated small bowel. SOFT TISSUES: Vascular calcifications. BONES: No acute fracture. IMPRESSION: 1. Persistently dilated small bowel with dilute contrast, concerning for ongoing small bowel obstruction. 2. Enteric tube tip projects over the stomach. Electronically signed by: Waddell Calk MD 06/23/2024 05:19 AM EST RP Workstation: [id number]   DG Abd Portable 1 View Result Date: 06/22/2024 EXAM: 1 VIEW XRAY OF THE ABDOMEN 06/22/2024 02:27:00 PM COMPARISON: CT abdomen and pelvis  06/22/2024, chest x-ray 03/22/2024 CLINICAL HISTORY: NG tube Placement FINDINGS: LINES, TUBES AND DEVICES: Enteric tube in place with tip and side-port in the stomach. BOWEL: Nonobstructive bowel gas pattern. SOFT TISSUES: Atherosclerotic plaque. BONES: No acute fracture. LUNGS: Blunting of bilateral costophrenic angles due to paracardial fat and scarring. No pleural effusion noted on the CT abdomen and pelvis. Lung apices collimated off view. IMPRESSION: 1. Enteric tube in place. 2. Blunting of bilateral costophrenic angles due to paracardial fat and scarring. No pleural effusion noted on the CT abdomen and pelvis. Electronically signed by: Morgane Naveau MD 06/22/2024 03:30 PM EST RP Workstation: [id number]    Scheduled Meds:  [START ON 06/25/2024] amiodarone   200 mg Oral Once per day on Monday Tuesday Wednesday Thursday Friday   diatrizoate  meglumine -sodium  90 mL Per NG tube Once   ezetimibe   10 mg Oral Daily   metoprolol  tartrate  5 mg Intravenous Q8H   sodium chloride  flush  3 mL Intravenous Q12H   Continuous Infusions:  sodium chloride  75 mL/hr at 06/24/24 0320     LOS: 2 days   Fredia Skeeter, MD [facility]  06/24/2024, 10:42 AM   *Please note that this is a verbal dictation therefore any spelling or grammatical errors are due to the Dragon Medical One system interpretation.  Please page via Amion and do not message via secure chat for urgent patient care matters. Secure chat can be used for non urgent patient care matters.  How to contact the [facility] Attending or Consulting provider 7A - 7P or covering provider during after hours 7P -7A, for this patient?  Check the care team in [facility] and look for a) attending/consulting [facility] provider listed and b) the [facility] team listed. Page or secure chat 7A-7P. Log into [email or web address] and use 's universal password to access. If you do not have the password, please contact the hospital operator. Locate the [facility] provider you are looking for  under [facility] and page to a number that you can be directly reached. If you still have difficulty reaching the provider, please page the [facility] (Director on Call) for the Hospitalists listed on amion for assistance.  "

## 2024-06-24 NOTE — Progress Notes (Signed)
" °   06/24/24 1300  Unmeasured Output  Emesis Occurrence 600   Patient vomited 600 ML, NG tube position checked by auscultation, marking unchanged at 67. Shortened the tubing to suction. Intermittent low suction resumed.  400 brown gastric content obtained.Patient reported to be feeling much better. MD made aware "

## 2024-06-24 NOTE — Plan of Care (Signed)

## 2024-06-24 NOTE — Progress Notes (Signed)
 "      Subjective: Doesn't feel as well today.  More distended.  No bowel function yet.  Still with some pain, but mild    Objective: Vital signs in last 24 hours: Temp:  [97.5 F (36.4 C)-99 F (37.2 C)] 98.6 F (37 C) (01/04 0740) Pulse Rate:  [61-81] 81 (01/04 0740) Resp:  [16-19] 16 (01/04 0740) BP: (144-180)/(55-92) 179/92 (01/04 0740) SpO2:  [92 %-95 %] 92 % (01/04 0740)    Intake/Output from previous day: 01/03 0701 - 01/04 0700 In: 1789.6 [I.V.:1789.6] Out: 2225 [Urine:200; Emesis/NG output:2025] Intake/Output this shift: No intake/output data recorded.  PE: Gen: NAD Abd: soft, but increased distention today, NGT in place and working despite worsening distention, 2L of output noted yesterday, some pain noted particularly on the left side of his abdomen, but no guarding or peritonitis  Lab Results:  Recent Labs    06/22/24 0553 06/22/24 0857 06/23/24 0846  WBC 6.4  --  6.7  HGB 17.3* 18.0* 16.1  HCT 49.8 53.0* 46.9  PLT 257  --  223   BMET Recent Labs    06/23/24 0846 06/24/24 0626  NA 138 140  K 4.4 4.7  CL 100 102  CO2 25 22  GLUCOSE 75 74  BUN 46* 43*  CREATININE 1.09 0.98  CALCIUM  9.3 8.8*   PT/INR No results for input(s): LABPROT, INR in the last 72 hours. CMP     Component Value Date/Time   NA 140 06/24/2024 0626   NA 137 03/22/2024 0955   K 4.7 06/24/2024 0626   CL 102 06/24/2024 0626   CO2 22 06/24/2024 0626   GLUCOSE 74 06/24/2024 0626   BUN 43 (H) 06/24/2024 0626   BUN 16 03/22/2024 0955   CREATININE 0.98 06/24/2024 0626   CALCIUM  8.8 (L) 06/24/2024 0626   PROT 6.9 06/23/2024 0846   PROT 7.2 03/22/2024 0955   ALBUMIN 3.8 06/23/2024 0846   ALBUMIN 4.6 03/22/2024 0955   AST 41 06/23/2024 0846   ALT 23 06/23/2024 0846   ALKPHOS 53 06/23/2024 0846   BILITOT 0.8 06/23/2024 0846   BILITOT 0.3 03/22/2024 0955   GFRNONAA >60 06/24/2024 0626   GFRAA 104 08/21/2018 1311   Lipase     Component Value Date/Time   LIPASE 24  06/22/2024 0553       Studies/Results: DG Abd Portable 1V Result Date: 06/24/2024 EXAM: 1 VIEW XRAY OF THE ABDOMEN 06/24/2024 04:38:00 AM COMPARISON: 06/23/2024 CLINICAL HISTORY: SBO (small bowel obstruction) (HCC) FINDINGS: LINES, TUBES AND DEVICES: Enteric tube in place with tip and side port projecting over the stomach. BOWEL: Marked gaseous distension of several loops of small bowel in the central abdomen, up to 4.9 cm in diameter, increased from prior, consistent with small bowel obstruction. Mild gaseous distension of the stomach. SOFT TISSUES: No abnormal calcifications. BONES: No acute fracture. LUNGS AND PLEURAL SPACES: Mild bibasilar atelectasis. Bilateral pleural effusions. IMPRESSION: 1. Marked gaseous distension of several loops of small bowel in the central abdomen, up to 4.9 cm in diameter, increased from prior, consistent with small bowel obstruction. 2. Mild gaseous distension of the stomach. Electronically signed by: Waddell Calk MD 06/24/2024 07:44 AM EST RP Workstation: [id number]   DG Abd Portable 1V-Small Bowel Obstruction Protocol-initial, 8 hr delay Result Date: 06/23/2024 EXAM: 1 VIEW XRAY OF THE ABDOMEN 06/23/2024 04:51:55 AM COMPARISON: 06/22/2024 CLINICAL HISTORY: FINDINGS: LINES, TUBES AND DEVICES: Enteric tube partially included in the field of view, tip in stomach. BOWEL: Dilute contrast within persistently dilated  small bowel. SOFT TISSUES: Vascular calcifications. BONES: No acute fracture. IMPRESSION: 1. Persistently dilated small bowel with dilute contrast, concerning for ongoing small bowel obstruction. 2. Enteric tube tip projects over the stomach. Electronically signed by: Waddell Calk MD 06/23/2024 05:19 AM EST RP Workstation: [id number]   DG Abd Portable 1 View Result Date: 06/22/2024 EXAM: 1 VIEW XRAY OF THE ABDOMEN 06/22/2024 02:27:00 PM COMPARISON: CT abdomen and pelvis 06/22/2024, chest x-ray 03/22/2024 CLINICAL HISTORY: NG tube Placement FINDINGS: LINES, TUBES  AND DEVICES: Enteric tube in place with tip and side-port in the stomach. BOWEL: Nonobstructive bowel gas pattern. SOFT TISSUES: Atherosclerotic plaque. BONES: No acute fracture. LUNGS: Blunting of bilateral costophrenic angles due to paracardial fat and scarring. No pleural effusion noted on the CT abdomen and pelvis. Lung apices collimated off view. IMPRESSION: 1. Enteric tube in place. 2. Blunting of bilateral costophrenic angles due to paracardial fat and scarring. No pleural effusion noted on the CT abdomen and pelvis. Electronically signed by: Morgane Naveau MD 06/22/2024 03:30 PM EST RP Workstation: [id number]   CT ABDOMEN PELVIS W CONTRAST Result Date: 06/22/2024 CLINICAL DATA:  Bowel obstruction suspected EXAM: CT ABDOMEN AND PELVIS WITH CONTRAST TECHNIQUE: Multidetector CT imaging of the abdomen and pelvis was performed using the standard protocol following bolus administration of intravenous contrast. RADIATION DOSE REDUCTION: This exam was performed according to the departmental dose-optimization program which includes automated exposure control, adjustment of the mA and/or kV according to patient size and/or use of iterative reconstruction technique. CONTRAST:  75mL OMNIPAQUE  IOHEXOL  350 MG/ML SOLN COMPARISON:  July 06, 2016 FINDINGS: Lower chest: No acute abnormality. Hepatobiliary: No cholelithiasis or biliary dilatation is noted. Stable right hepatic cyst. Pancreas: Unremarkable. No pancreatic ductal dilatation or surrounding inflammatory changes. Spleen: Normal in size without focal abnormality. Adrenals/Urinary Tract: Adrenal glands appear normal. Nonobstructive renal cyst is noted. No hydronephrosis or renal obstruction is noted. Urinary bladder is unremarkable. Stomach/Bowel: Stomach and appendix are unremarkable. Severe proximal small bowel dilatation is noted with transition zone seen in central portion of abdomen on image number 34 series 2, consistent with small bowel obstruction,  potentially due to adhesion. Sigmoid diverticulosis without inflammation. Stool is noted in the nondilated colon. Vascular/Lymphatic: Aortic atherosclerosis. No enlarged abdominal or pelvic lymph nodes. Reproductive: Mild prostatic enlargement. Other: Small fat and fluid-filled left inguinal hernia is noted. No ascites is noted. Musculoskeletal: No acute or significant osseous findings. IMPRESSION: 1. Severe proximal small bowel dilatation is noted with transition zone seen in central portion of abdomen, consistent with small bowel obstruction, potentially due to adhesion. 2. Sigmoid diverticulosis without inflammation. 3. Mild prostatic enlargement. 4. Small fat and fluid-filled left inguinal hernia. 5. Aortic atherosclerosis. Aortic Atherosclerosis (ICD10-I70.0). Electronically Signed   By: Lynwood Landy Raddle M.D.   On: 06/22/2024 11:21    Anti-infectives: Anti-infectives (From admission, onward)    None        Assessment/Plan SBO -no previous abdominal surgery -24 hr delay film worse today with increase in abdominal distention and gastric distention.  Clinically patient more distended and with more pain today -2L of output yesterday noted -will repeat SBO protocol today to give him one more shot at improvement without surgery.  If he continues to remain obstructed, surgical intervention will need to be considered. -d/w patient who expresses understanding. -d/w hospitalist on the ward  FEN - NPO, NGT, IVFs per primary  VTE - hold plavix , chemical prophylaxis ok from our standpoint ID - none currently  needed  CAD H/o NSTEMI diverticulosis  I  reviewed hospitalist notes, last 24 h vitals and pain scores, last 48 h intake and output, last 24 h labs and trends, and last 24 h imaging results.   LOS: 2 days    Burnard FORBES Banter , [facility] 06/24/2024, 9:42 AM Please see Amion for pager number during day hours 7:00am-4:30pm or 7:00am -11:30am on weekends  "

## 2024-06-25 ENCOUNTER — Inpatient Hospital Stay (HOSPITAL_COMMUNITY)

## 2024-06-25 DIAGNOSIS — K56609 Unspecified intestinal obstruction, unspecified as to partial versus complete obstruction: Secondary | ICD-10-CM | POA: Diagnosis not present

## 2024-06-25 MED ORDER — SODIUM CHLORIDE 0.9 % IV SOLN: INTRAVENOUS | Status: AC

## 2024-06-25 NOTE — Progress Notes (Signed)
 " PROGRESS NOTE    Franklin Ward  FMW:[id number] DOB: May 04, 1946 DOA: 06/22/2024 PCP: Arloa Elsie JONELLE, MD   Brief Narrative:  Franklin Ward is a 79 y.o. male with medical history significant of hypertension, hyperlipidemia, CAD status post stent, PVCs, diverticulosis with history of diverticulitis and abscess presented with nausea, vomiting, abdominal pain for the last 3 days.  Denied any fever or chills, chest pain or shortness of breath.   Hemodynamically stable upon arrival to ED. CT abdomen pelvis showed severe proximal small bowel dilation with transition zone consistent with SBO.  Diverticulosis without diverticulitis.  General surgery consulted.   Assessment & Plan:   Principal Problem:   SBO (small bowel obstruction) (HCC) Active Problems:   Benign essential HTN   PVC's (premature ventricular contractions)   Coronary arteriosclerosis   HLD (hyperlipidemia)  SBO Confirmed with abdominal series.  24-hour delay film worse today with clinically increased abdominal distention and still no bowel function.  Due to not passing flatus and increased abdominal distention, general surgery.  SBO protocol yesterday which still shows obstruction however patient says that he feels better, less distended but still not passing flatus.  General surgery plans to repeat scans again tomorrow.  Patient wants to avoid surgery.   Hypertension PTA only on metoprolol  50 mg twice daily which is on hold and currently he is on Lopressor  IV 5 mg 3 times daily but blood pressure still elevated at times, continue as needed hydralazine .     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 - Continue metoprolol  and amiodarone .  DVT prophylaxis: SCDs Start: 06/22/24 1406   Code Status: Full Code  Family Communication:  None present at bedside.   Plan of care discussed with patient in length and he/she verbalized understanding and agreed with it.  Status is: Inpatient Remains inpatient appropriate because: Still with SBO.   Estimated body mass index is 26.08 kg/m as calculated from the following:   Height as of this encounter: 5' 8 (1.727 m).   Weight as of this encounter: 77.8 kg.    Nutritional Assessment: Body mass index is 26.08 kg/m.SABRA Seen by dietician.  I agree with the assessment and plan as outlined below: Nutrition Status:        . Skin Assessment: I have examined the patient's skin and I agree with the wound assessment as performed by the wound care RN as outlined below:    Consultants:  General surgery  Procedures:  As above  Antimicrobials:  Anti-infectives (From admission, onward)    None         Subjective: Seen and examined.  Feels less distended.  Still not passing flatus though.  No nausea today.  No other complaint.  Objective: Vitals:   06/24/24 1944 06/24/24 2343 06/25/24 0330 06/25/24 0800  BP: (!) 153/92 (!) 154/62 (!) 171/86 (!) 157/75  Pulse: 90 79 91 64  Resp: 19 18 19 19   Temp: 98 F (36.7 C) 98 F (36.7 C) 98.4 F (36.9 C) (!) 97.5 F (36.4 C)  TempSrc: Oral Oral Oral Oral  SpO2: 92% 94% 92% 91%  Weight:      Height:        Intake/Output Summary (Last 24 hours) at 06/25/2024 1036 Last data filed at 06/25/2024 0528 Gross per 24 hour  Intake 303.75 ml  Output 2150  ml  Net -1846.25 ml   Filed Weights   06/22/24 0855  Weight: 77.8 kg    Examination:  General exam: Appears calm and comfortable  Respiratory system: Clear to auscultation. Respiratory effort normal. Cardiovascular system: S1 & S2 heard, RRR. No JVD, murmurs, rubs, gallops or clicks. No pedal edema. Gastrointestinal system: Abdomen is slightly distended but soft and nontender. No organomegaly or masses felt. Normal bowel sounds heard. Central nervous system: Alert and oriented. No focal  neurological deficits. Extremities: Symmetric 5 x 5 power. Skin: No rashes, lesions or ulcers.  Psychiatry: Judgement and insight appear normal. Mood & affect appropriate.   Data Reviewed: I have personally reviewed following labs and imaging studies  CBC: Recent Labs  Lab 06/22/24 0553 06/22/24 0857 06/23/24 0846  WBC 6.4  --  6.7  NEUTROABS 3.6  --   --   HGB 17.3* 18.0* 16.1  HCT 49.8 53.0* 46.9  MCV 90.5  --  92.7  PLT 257  --  223   Basic Metabolic Panel: Recent Labs  Lab 06/22/24 0553 06/22/24 0857 06/23/24 0846 06/24/24 0626  NA 134* 136 138 140  K 4.5 4.4 4.4 4.7  CL 96* 101 100 102  CO2 22  --  25 22  GLUCOSE 105* 108* 75 74  BUN 46* 46* 46* 43*  CREATININE 1.17 1.20 1.09 0.98  CALCIUM  10.2  --  9.3 8.8*  MG  --   --   --  2.3   GFR: Estimated Creatinine Clearance: 59.1 mL/min (by C-G formula based on SCr of 0.98 mg/dL). Liver Function Tests: Recent Labs  Lab 06/22/24 0553 06/23/24 0846  AST 50* 41  ALT 23 23  ALKPHOS 55 53  BILITOT 1.2 0.8  PROT 7.8 6.9  ALBUMIN 4.4 3.8   Recent Labs  Lab 06/22/24 0553  LIPASE 24   No results for input(s): AMMONIA in the last 168 hours. Coagulation Profile: No results for input(s): INR, PROTIME in the last 168 hours. Cardiac Enzymes: No results for input(s): CKTOTAL, CKMB, CKMBINDEX, TROPONINI in the last 168 hours. BNP (last 3 results) No results for input(s): PROBNP in the last 8760 hours. HbA1C: No results for input(s): HGBA1C in the last 72 hours. CBG: No results for input(s): GLUCAP in the last 168 hours. Lipid Profile: No results for input(s): CHOL, HDL, LDLCALC, TRIG, CHOLHDL, LDLDIRECT in the last 72 hours. Thyroid  Function Tests: No results for input(s): TSH, T4TOTAL, FREET4, T3FREE, THYROIDAB in the last 72 hours. Anemia Panel: No results for input(s): VITAMINB12, FOLATE, FERRITIN, TIBC, IRON, RETICCTPCT in the last 72 hours. Sepsis  Labs: No results for input(s): PROCALCITON, LATICACIDVEN in the last 168 hours.  No results found for this or any previous visit (from the past 240 hours).   Radiology Studies: DG Abd Portable 1V-Small Bowel Obstruction Protocol-initial, 8 hr delay Result Date: 06/24/2024 EXAM: 1 VIEW XRAY OF THE ABDOMEN 06/24/2024 10:11:00 PM COMPARISON: Comparison with 06/24/2024. CLINICAL HISTORY: FINDINGS: LINES, TUBES AND DEVICES: The endotracheal tube is present with the tip projecting over the left upper quadrant, consistent with its location in the body of the stomach. BOWEL: Mid abdominal gas-filled distended small bowel similar to prior study. Appearance is consistent with small bowel obstruction. No improvement since prior study. Paucity of gas in the colon. SOFT TISSUES: No abnormal calcifications. No radiopaque stones. BONES: Degenerative changes in the spine. No acute fracture. IMPRESSION: 1. Endotracheal tube tip projects over the stomach, compatible with esophageal intubation. 2. Small bowel obstruction, no improvement since  prior study. Electronically signed by: Elsie Gravely MD 06/24/2024 10:23 PM EST RP Workstation: [id number]   DG Abd Portable 1V Result Date: 06/24/2024 EXAM: 1 VIEW XRAY OF THE ABDOMEN 06/24/2024 04:38:00 AM COMPARISON: 06/23/2024 CLINICAL HISTORY: SBO (small bowel obstruction) (HCC) FINDINGS: LINES, TUBES AND DEVICES: Enteric tube in place with tip and side port projecting over the stomach. BOWEL: Marked gaseous distension of several loops of small bowel in the central abdomen, up to 4.9 cm in diameter, increased from prior, consistent with small bowel obstruction. Mild gaseous distension of the stomach. SOFT TISSUES: No abnormal calcifications. BONES: No acute fracture. LUNGS AND PLEURAL SPACES: Mild bibasilar atelectasis. Bilateral pleural effusions. IMPRESSION: 1. Marked gaseous distension of several loops of small bowel in the central abdomen, up to 4.9 cm in diameter,  increased from prior, consistent with small bowel obstruction. 2. Mild gaseous distension of the stomach. Electronically signed by: Waddell Calk MD 06/24/2024 07:44 AM EST RP Workstation: [id number]    Scheduled Meds:  amiodarone   200 mg Oral Once per day on Monday Tuesday Wednesday Thursday Friday   ezetimibe   10 mg Oral Daily   metoprolol  tartrate  5 mg Intravenous Q8H   sodium chloride  flush  3 mL Intravenous Q12H   Continuous Infusions:     LOS: 3 days   Fredia Skeeter, MD [facility]  06/25/2024, 10:36 AM   *Please note that this is a verbal dictation therefore any spelling or grammatical errors are due to the Dragon Medical One system interpretation.  Please page via Amion and do not message via secure chat for urgent patient care matters. Secure chat can be used for non urgent patient care matters.  How to contact the [facility] Attending or Consulting provider 7A - 7P or covering provider during after hours 7P -7A, for this patient?  Check the care team in [facility] and look for a) attending/consulting [facility] provider listed and b) the [facility] team listed. Page or secure chat 7A-7P. Log into [email or web address] and use [place]'s universal password to access. If you do not have the password, please contact the hospital operator. Locate the [facility] provider you are looking for under [facility] and page to a number that you can be directly reached. If you still have difficulty reaching the provider, please page the [facility] (Director on Call) for the Hospitalists listed on amion for assistance.  "

## 2024-06-25 NOTE — Plan of Care (Signed)

## 2024-06-25 NOTE — Progress Notes (Signed)
 "  Progress Note     Subjective: Patient feels that he is a little less distended this morning. Denies pain at rest. No BM or flatulence. Denies nausea currently. Had episode of emesis yesterday.   ROS  All negative with the exception of above.  Objective: Vital signs in last 24 hours: Temp:  [97.5 F (36.4 C)-98.4 F (36.9 C)] 97.5 F (36.4 C) (01/05 0800) Pulse Rate:  [64-97] 64 (01/05 0800) Resp:  [18-19] 19 (01/05 0800) BP: (153-171)/(62-92) 157/75 (01/05 0800) SpO2:  [91 %-94 %] 91 % (01/05 0800)    Intake/Output from previous day: 01/04 0701 - 01/05 0700 In: 303.8 [I.V.:303.8] Out: 2150 [Urine:650; Emesis/NG output:1500] Intake/Output this shift: No intake/output data recorded.  PE: General: Pleasant male who is laying in bed in NAD. HEENT: Head is normocephalic, atraumatic.  Heart: HR normal during encounter.  Lungs: Respiratory effort nonlabored. Abd: Soft with mild distention. Tenderness to palpation of left upper abdomen and left lower abdomen. No rebound tenderness or guarding.  MS: Able to move all 4 extremities.  Skin: Warm and dry.  Psych: A&Ox3 with an appropriate affect.    Lab Results:  Recent Labs    06/22/24 0857 06/23/24 0846  WBC  --  6.7  HGB 18.0* 16.1  HCT 53.0* 46.9  PLT  --  223   BMET Recent Labs    06/23/24 0846 06/24/24 0626  NA 138 140  K 4.4 4.7  CL 100 102  CO2 25 22  GLUCOSE 75 74  BUN 46* 43*  CREATININE 1.09 0.98  CALCIUM  9.3 8.8*   PT/INR No results for input(s): LABPROT, INR in the last 72 hours. CMP     Component Value Date/Time   NA 140 06/24/2024 0626   NA 137 03/22/2024 0955   K 4.7 06/24/2024 0626   CL 102 06/24/2024 0626   CO2 22 06/24/2024 0626   GLUCOSE 74 06/24/2024 0626   BUN 43 (H) 06/24/2024 0626   BUN 16 03/22/2024 0955   CREATININE 0.98 06/24/2024 0626   CALCIUM  8.8 (L) 06/24/2024 0626   PROT 6.9 06/23/2024 0846   PROT 7.2 03/22/2024 0955   ALBUMIN 3.8 06/23/2024 0846   ALBUMIN 4.6  03/22/2024 0955   AST 41 06/23/2024 0846   ALT 23 06/23/2024 0846   ALKPHOS 53 06/23/2024 0846   BILITOT 0.8 06/23/2024 0846   BILITOT 0.3 03/22/2024 0955   GFRNONAA >60 06/24/2024 0626   GFRAA 104 08/21/2018 1311   Lipase     Component Value Date/Time   LIPASE 24 06/22/2024 0553       Studies/Results: DG Abd Portable 1V-Small Bowel Obstruction Protocol-initial, 8 hr delay Result Date: 06/24/2024 EXAM: 1 VIEW XRAY OF THE ABDOMEN 06/24/2024 10:11:00 PM COMPARISON: Comparison with 06/24/2024. CLINICAL HISTORY: FINDINGS: LINES, TUBES AND DEVICES: The endotracheal tube is present with the tip projecting over the left upper quadrant, consistent with its location in the body of the stomach. BOWEL: Mid abdominal gas-filled distended small bowel similar to prior study. Appearance is consistent with small bowel obstruction. No improvement since prior study. Paucity of gas in the colon. SOFT TISSUES: No abnormal calcifications. No radiopaque stones. BONES: Degenerative changes in the spine. No acute fracture. IMPRESSION: 1. Endotracheal tube tip projects over the stomach, compatible with esophageal intubation. 2. Small bowel obstruction, no improvement since prior study. Electronically signed by: Elsie Gravely MD 06/24/2024 10:23 PM EST RP Workstation: [id number]   DG Abd Portable 1V Result Date: 06/24/2024 EXAM: 1 VIEW XRAY OF THE  ABDOMEN 06/24/2024 04:38:00 AM COMPARISON: 06/23/2024 CLINICAL HISTORY: SBO (small bowel obstruction) (HCC) FINDINGS: LINES, TUBES AND DEVICES: Enteric tube in place with tip and side port projecting over the stomach. BOWEL: Marked gaseous distension of several loops of small bowel in the central abdomen, up to 4.9 cm in diameter, increased from prior, consistent with small bowel obstruction. Mild gaseous distension of the stomach. SOFT TISSUES: No abnormal calcifications. BONES: No acute fracture. LUNGS AND PLEURAL SPACES: Mild bibasilar atelectasis. Bilateral pleural  effusions. IMPRESSION: 1. Marked gaseous distension of several loops of small bowel in the central abdomen, up to 4.9 cm in diameter, increased from prior, consistent with small bowel obstruction. 2. Mild gaseous distension of the stomach. Electronically signed by: Waddell Calk MD 06/24/2024 07:44 AM EST RP Workstation: [id number]    Anti-infectives: Anti-infectives (From admission, onward)    None        Assessment/Plan SBO -No previous abdominal surgery -Afebrile. -WBC from 1/3 WNL -NGT output 1500 from 1/4-1/5. -No abdominal pain at rest. Still having nausea and last vomited yesterday. Distention improved slightly. -Underwent SBO protocol again yesterday.  -8 hr film showed continued small bowel obstruction with no improvement compared to previous study.  -Will plan for repeat film this morning.  -If he continues to remain obstructed, surgical intervention will need to be considered. Will discuss further with attending.  -D/w patient who expresses understanding. -Will continue to follow.    FEN - NPO, NGT, IVFs per primary  VTE - hold plavix , chemical prophylaxis ok from our standpoint ID - none currently  needed   CAD H/o NSTEMI diverticulosis     LOS: 3 days   I reviewed specialist notes, nursing notes, hospitalist notes, last 24 h vitals and pain scores, last 48 h intake and output, last 24 h labs and trends, and last 24 h imaging results.  This care required moderate level of medical decision making.    Marjorie Carlyon Favre, [facility] Surgery 06/25/2024, 8:35 AM Please see Amion for pager number during day hours 7:00am-4:30pm  "

## 2024-06-25 NOTE — Progress Notes (Signed)
 Franklin Ward [phone] [facility] Liaison note:    This is a pending outpatient based Palliative care patient with [facility].    Hospital liaison will continue to follow for discharge disposition.?    Please call for any outpatient based palliative care related questions or concerns.    Thank you,    Greig Basket, BSN, [facility] Liaison [phone]

## 2024-06-25 NOTE — Plan of Care (Signed)
   Problem: Activity: Goal: Risk for activity intolerance will decrease Outcome: Progressing

## 2024-06-26 ENCOUNTER — Inpatient Hospital Stay (HOSPITAL_COMMUNITY)

## 2024-06-26 DIAGNOSIS — K56609 Unspecified intestinal obstruction, unspecified as to partial versus complete obstruction: Secondary | ICD-10-CM | POA: Diagnosis not present

## 2024-06-26 LAB — BASIC METABOLIC PANEL WITH GFR
Anion gap: 12 (ref 5–15)
BUN: 32 mg/dL — ABNORMAL HIGH (ref 8–23)
CO2: 20 mmol/L — ABNORMAL LOW (ref 22–32)
Calcium: 8.1 mg/dL — ABNORMAL LOW (ref 8.9–10.3)
Chloride: 111 mmol/L (ref 98–111)
Creatinine, Ser: 0.73 mg/dL (ref 0.61–1.24)
GFR, Estimated: >60 mL/min
Glucose, Bld: 81 mg/dL (ref 70–99)
Potassium: 3.8 mmol/L (ref 3.5–5.1)
Sodium: 144 mmol/L (ref 135–145)

## 2024-06-26 MED ORDER — HYDRALAZINE HCL 20 MG/ML IJ SOLN
10.0000 mg | Freq: Four times a day (QID) | INTRAMUSCULAR | Status: DC | PRN
  Administered 2024-06-26: 10 mg via INTRAVENOUS
  Filled 2024-06-26: qty 1

## 2024-06-26 MED ORDER — HYDRALAZINE HCL 20 MG/ML IJ SOLN: 10.0000 mg | Freq: Four times a day (QID) | INTRAMUSCULAR | Status: DC | PRN

## 2024-06-26 MED ORDER — ONDANSETRON HCL 4 MG/2ML IJ SOLN
4.0000 mg | Freq: Four times a day (QID) | INTRAMUSCULAR | Status: AC | PRN
  Administered 2024-06-26: 4 mg via INTRAVENOUS
  Filled 2024-06-26: qty 2

## 2024-06-26 MED ORDER — BISACODYL 10 MG RE SUPP
10.0000 mg | Freq: Once | RECTAL | Status: AC
  Administered 2024-06-26: 10 mg via RECTAL
  Filled 2024-06-26: qty 1

## 2024-06-26 MED ORDER — ORAL CARE MOUTH RINSE: 15.0000 mL | OROMUCOSAL | Status: DC | PRN

## 2024-06-26 MED ORDER — VITAMIN B-12 1000 MCG PO TABS
2000.0000 ug | ORAL_TABLET | Freq: Every day | ORAL | Status: DC
  Administered 2024-06-26 – 2024-07-05 (×9): 2000 ug via ORAL
  Filled 2024-06-26 (×9): qty 2

## 2024-06-26 MED ORDER — METOPROLOL TARTRATE 50 MG PO TABS
50.0000 mg | ORAL_TABLET | Freq: Two times a day (BID) | ORAL | Status: DC
  Administered 2024-06-26 – 2024-06-28 (×5): 50 mg via ORAL
  Filled 2024-06-26 (×5): qty 1

## 2024-06-26 NOTE — Plan of Care (Signed)

## 2024-06-26 NOTE — Plan of Care (Signed)
   Problem: Education: Goal: Knowledge of General Education information will improve Description Including pain rating scale, medication(s)/side effects and non-pharmacologic comfort measures Outcome: Progressing

## 2024-06-26 NOTE — Progress Notes (Signed)
 "  Progress Note     Subjective: Patient feels better overall. Feels less distended. Has had a small bowel movement and flatulence. Denies n/v. Currently NPO.   ROS  All negative with the exception of above.  Objective: Vital signs in last 24 hours: Temp:  [97.3 F (36.3 C)-98.9 F (37.2 C)] 98.9 F (37.2 C) (01/06 0728) Pulse Rate:  [49-101] 91 (01/06 0728) Resp:  [16-20] 20 (01/06 0728) BP: (127-165)/(73-93) 164/73 (01/06 0728) SpO2:  [92 %-96 %] 94 % (01/06 0728) Last BM Date : 06/26/24  Intake/Output from previous day: 01/05 0701 - 01/06 0700 In: 1169.5 [I.V.:1169.5] Out: 770 [Urine:220; Emesis/NG output:550] Intake/Output this shift: Total I/O In: -  Out: 200 [Urine:200]  PE: General: Pleasant male who is laying in bed in NAD. HEENT: Head is normocephalic, atraumatic. NGT in place. Heart: HR normal during encounter.  Lungs: Respiratory effort nonlabored. Abd: Soft with mild distention (improved since yesterday). NT. No rebound tenderness or guarding.  MS: Able to move all 4 extremities.  Skin: Warm and dry.  Psych: A&Ox3 with an appropriate affect.  Lab Results:  No results for input(s): WBC, HGB, HCT, PLT in the last 72 hours. BMET Recent Labs    06/24/24 0626 06/26/24 0906  NA 140 144  K 4.7 3.8  CL 102 111  CO2 22 20*  GLUCOSE 74 81  BUN 43* 32*  CREATININE 0.98 0.73  CALCIUM  8.8* 8.1*   PT/INR No results for input(s): LABPROT, INR in the last 72 hours. CMP     Component Value Date/Time   NA 144 06/26/2024 0906   NA 137 03/22/2024 0955   K 3.8 06/26/2024 0906   CL 111 06/26/2024 0906   CO2 20 (L) 06/26/2024 0906   GLUCOSE 81 06/26/2024 0906   BUN 32 (H) 06/26/2024 0906   BUN 16 03/22/2024 0955   CREATININE 0.73 06/26/2024 0906   CALCIUM  8.1 (L) 06/26/2024 0906   PROT 6.9 06/23/2024 0846   PROT 7.2 03/22/2024 0955   ALBUMIN 3.8 06/23/2024 0846   ALBUMIN 4.6 03/22/2024 0955   AST 41 06/23/2024 0846   ALT 23 06/23/2024 0846    ALKPHOS 53 06/23/2024 0846   BILITOT 0.8 06/23/2024 0846   BILITOT 0.3 03/22/2024 0955   GFRNONAA >60 06/26/2024 0906   GFRAA 104 08/21/2018 1311   Lipase     Component Value Date/Time   LIPASE 24 06/22/2024 0553       Studies/Results: DG Abd Portable 1V Result Date: 06/26/2024 EXAM: 1 VIEW XRAY OF THE ABDOMEN 06/26/2024 06:03:00 AM COMPARISON: 06/25/2024 CLINICAL HISTORY: SBO (small bowel obstruction) (HCC) FINDINGS: LINES, TUBES AND DEVICES: Esophagogastric tube terminates in the stomach. BOWEL: Dilation of multiple small bowel loops in the abdomen, unchanged from prior study, consistent with small bowel obstruction. Maximal small bowel diameter 4.9 cm. SOFT TISSUES: No abnormal calcifications. BONES: No acute fracture. 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 IMPRESSION: 1. Unchanged appearance of the high grade small bowel obstruction. 2. Similarly positioned esophagogastric tube terminating in the stomach. Electronically signed by: Rogelia Myers MD 06/26/2024 08:38 AM EST RP Workstation: [id number]   DG Abd Portable 1V Result Date: 06/25/2024 CLINICAL DATA:  Follow-up small bowel obstruction. EXAM: DG ABD PORTABLE 1V COMPARISON:  06/24/2024.  Abdomen and pelvis CT dated 06/22/2024. FINDINGS: Multiple dilated small bowel loops are again demonstrated with mild improvement. Decreased gaseous distention of the stomach. Nasogastric tube tip and side hole in the proximal stomach. Minimal gas and stool is again demonstrated in  the right colon, normal in caliber. Thoracic and lumbar spine degenerative changes. IMPRESSION: Mildly improved small bowel obstruction. Electronically Signed   By: Elspeth Bathe M.D.   On: 06/25/2024 17:49   DG Abd Portable 1V-Small Bowel Obstruction Protocol-initial, 8 hr delay Result Date: 06/24/2024 EXAM: 1 VIEW XRAY OF THE ABDOMEN 06/24/2024 10:11:00 PM COMPARISON: Comparison with 06/24/2024. CLINICAL HISTORY: FINDINGS: LINES, TUBES AND DEVICES:  The endotracheal tube is present with the tip projecting over the left upper quadrant, consistent with its location in the body of the stomach. BOWEL: Mid abdominal gas-filled distended small bowel similar to prior study. Appearance is consistent with small bowel obstruction. No improvement since prior study. Paucity of gas in the colon. SOFT TISSUES: No abnormal calcifications. No radiopaque stones. BONES: Degenerative changes in the spine. No acute fracture. IMPRESSION: 1. Endotracheal tube tip projects over the stomach, compatible with esophageal intubation. 2. Small bowel obstruction, no improvement since prior study. Electronically signed by: Elsie Gravely MD 06/24/2024 10:23 PM EST RP Workstation: [id number]    Anti-infectives: Anti-infectives (From admission, onward)    None        Assessment/Plan SBO -No previous abdominal surgery -Underwent SBO protocol again yesterday.  -8 hr film showed continued small bowel obstruction with no improvement compared to previous study.  -Most recent abdominal xray unchanged. -Afebrile. -WBC from 1/3 WNL -NGT output 550 from 1/5-1/6. Has been clamped since yesterday afternoon. -No abdominal pain. Denies n/v. Distention improved. Had a BM and flatulence. -Will plan to leave NGT clamped and let him have FLD. Pending tolerance, may consider removing NGT this afternoon.  -If he continues to remain obstructed, surgical intervention will need to be considered. -D/w patient who expresses understanding. -Will continue to follow.    FEN - FLD, NGT clamped, IVFs per primary  VTE - hold plavix , chemical prophylaxis ok from our standpoint ID - none currently  needed   CAD H/o NSTEMI diverticulosis    LOS: 4 days   I reviewed specialist notes, hospitalist notes, consulting provider notes, nursing notes, last 24 h vitals and pain scores, last 48 h intake and output, last 24 h labs and trends, and last 24 h imaging results.  This care required  moderate level of medical decision making.    Marjorie Carlyon Favre, [facility] Surgery 06/26/2024, 11:02 AM Please see Amion for pager number during day hours 7:00am-4:30pm  "

## 2024-06-26 NOTE — Progress Notes (Signed)
 " PROGRESS NOTE    Franklin Ward  FMW:[id number] DOB: 1945-08-20 DOA: 06/22/2024 PCP: Arloa Elsie JONELLE, MD   Brief Narrative:  Franklin Ward is a 79 y.o. male with medical history significant of hypertension, hyperlipidemia, CAD status post stent, PVCs, diverticulosis with history of diverticulitis and abscess presented with nausea, vomiting, abdominal pain for the last 3 days.  Denied any fever or chills, chest pain or shortness of breath.   Hemodynamically stable upon arrival to ED. CT abdomen pelvis showed severe proximal small bowel dilation with transition zone consistent with SBO.  Diverticulosis without diverticulitis.  General surgery consulted.   Assessment & Plan:   Principal Problem:   SBO (small bowel obstruction) (HCC) Active Problems:   Benign essential HTN   PVC's (premature ventricular contractions)   Coronary arteriosclerosis   HLD (hyperlipidemia)  SBO Confirmed with abdominal series.  24-hour delay film worse today with clinically increased abdominal distention and still no bowel function.  Due to not passing flatus and increased abdominal distention, general surgery.  SBO protocol 06/24/2024 which again showed obstruction however patient started feeling better yesterday.  Reportedly, overnight he had a bowel movement as well.  Hopefully surgery can be avoided.  General surgery managing.   Hypertension PTA only on metoprolol  50 mg twice daily which is on hold and currently he is on Lopressor  IV 5 mg 3 times daily, blood pressure labile, he is able to take medications, will resume p.o. metoprolol  and stop IV metoprolol .  Continue as needed IV hydralazine .   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 - Continue metoprolol  and amiodarone .  DVT prophylaxis: SCDs Start: 06/22/24 1406   Code Status:  Full Code  Family Communication:  None present at bedside.  Plan of care discussed with patient in length and he/she verbalized understanding and agreed with it.  Status is: Inpatient Remains inpatient appropriate because: Still with SBO.   Estimated body mass index is 26.08 kg/m as calculated from the following:   Height as of this encounter: 5' 8 (1.727 m).   Weight as of this encounter: 77.8 kg.    Nutritional Assessment: Body mass index is 26.08 kg/m.SABRA Seen by dietician.  I agree with the assessment and plan as outlined below: Nutrition Status:        . Skin Assessment: I have examined the patient's skin and I agree with the wound assessment as performed by the wound care RN as outlined below:    Consultants:  General surgery  Procedures:  As above  Antimicrobials:  Anti-infectives (From admission, onward)    None         Subjective: Patient seen and examined.  He says that his abdomen is feeling better.  He confirmed that he had a bowel movement overnight.  Passing flatus.  No nausea.  Objective: Vitals:   06/26/24 0000 06/26/24 0151 06/26/24 0432 06/26/24 0728  BP: (!) 165/82 (!) 163/85 127/85 (!) 164/73  Pulse: 85  (!) 101 91  Resp: 17  18 20   Temp: (!) 97.5 F (36.4 C)  (!) 97.3 F (36.3 C) 98.9 F (37.2 C)  TempSrc: Oral  Oral   SpO2: 92%  96% 94%  Weight:      Height:        Intake/Output Summary (Last 24 hours) at 06/26/2024 0741 Last data filed at  06/26/2024 0715 Gross per 24 hour  Intake 1169.5 ml  Output 950 ml  Net 219.5 ml   Filed Weights   06/22/24 0855  Weight: 77.8 kg    Examination:  General exam: Appears calm and comfortable  Respiratory system: Clear to auscultation. Respiratory effort normal. Cardiovascular system: S1 & S2 heard, RRR. No JVD, murmurs, rubs, gallops or clicks. No pedal edema. Gastrointestinal system: Abdomen is very minimally distended, soft and nontender. No organomegaly or masses felt. Normal bowel  sounds heard. Central nervous system: Alert and oriented. No focal neurological deficits. Extremities: Symmetric 5 x 5 power. Skin: No rashes, lesions or ulcers.  Psychiatry: Judgement and insight appear normal. Mood & affect appropriate.   Data Reviewed: I have personally reviewed following labs and imaging studies  CBC: Recent Labs  Lab 06/22/24 0553 06/22/24 0857 06/23/24 0846  WBC 6.4  --  6.7  NEUTROABS 3.6  --   --   HGB 17.3* 18.0* 16.1  HCT 49.8 53.0* 46.9  MCV 90.5  --  92.7  PLT 257  --  223   Basic Metabolic Panel: Recent Labs  Lab 06/22/24 0553 06/22/24 0857 06/23/24 0846 06/24/24 0626  NA 134* 136 138 140  K 4.5 4.4 4.4 4.7  CL 96* 101 100 102  CO2 22  --  25 22  GLUCOSE 105* 108* 75 74  BUN 46* 46* 46* 43*  CREATININE 1.17 1.20 1.09 0.98  CALCIUM  10.2  --  9.3 8.8*  MG  --   --   --  2.3   GFR: Estimated Creatinine Clearance: 59.1 mL/min (by C-G formula based on SCr of 0.98 mg/dL). Liver Function Tests: Recent Labs  Lab 06/22/24 0553 06/23/24 0846  AST 50* 41  ALT 23 23  ALKPHOS 55 53  BILITOT 1.2 0.8  PROT 7.8 6.9  ALBUMIN 4.4 3.8   Recent Labs  Lab 06/22/24 0553  LIPASE 24   No results for input(s): AMMONIA in the last 168 hours. Coagulation Profile: No results for input(s): INR, PROTIME in the last 168 hours. Cardiac Enzymes: No results for input(s): CKTOTAL, CKMB, CKMBINDEX, TROPONINI in the last 168 hours. BNP (last 3 results) No results for input(s): PROBNP in the last 8760 hours. HbA1C: No results for input(s): HGBA1C in the last 72 hours. CBG: No results for input(s): GLUCAP in the last 168 hours. Lipid Profile: No results for input(s): CHOL, HDL, LDLCALC, TRIG, CHOLHDL, LDLDIRECT in the last 72 hours. Thyroid  Function Tests: No results for input(s): TSH, T4TOTAL, FREET4, T3FREE, THYROIDAB in the last 72 hours. Anemia Panel: No results for input(s): VITAMINB12, FOLATE,  FERRITIN, TIBC, IRON, RETICCTPCT in the last 72 hours. Sepsis Labs: No results for input(s): PROCALCITON, LATICACIDVEN in the last 168 hours.  No results found for this or any previous visit (from the past 240 hours).   Radiology Studies: DG Abd Portable 1V Result Date: 06/25/2024 CLINICAL DATA:  Follow-up small bowel obstruction. EXAM: DG ABD PORTABLE 1V COMPARISON:  06/24/2024.  Abdomen and pelvis CT dated 06/22/2024. FINDINGS: Multiple dilated small bowel loops are again demonstrated with mild improvement. Decreased gaseous distention of the stomach. Nasogastric tube tip and side hole in the proximal stomach. Minimal gas and stool is again demonstrated in the right colon, normal in caliber. Thoracic and lumbar spine degenerative changes. IMPRESSION: Mildly improved small bowel obstruction. Electronically Signed   By: Elspeth Bathe M.D.   On: 06/25/2024 17:49   DG Abd Portable 1V-Small Bowel Obstruction Protocol-initial, 8 hr delay Result Date:  06/24/2024 EXAM: 1 VIEW XRAY OF THE ABDOMEN 06/24/2024 10:11:00 PM COMPARISON: Comparison with 06/24/2024. CLINICAL HISTORY: FINDINGS: LINES, TUBES AND DEVICES: The endotracheal tube is present with the tip projecting over the left upper quadrant, consistent with its location in the body of the stomach. BOWEL: Mid abdominal gas-filled distended small bowel similar to prior study. Appearance is consistent with small bowel obstruction. No improvement since prior study. Paucity of gas in the colon. SOFT TISSUES: No abnormal calcifications. No radiopaque stones. BONES: Degenerative changes in the spine. No acute fracture. IMPRESSION: 1. Endotracheal tube tip projects over the stomach, compatible with esophageal intubation. 2. Small bowel obstruction, no improvement since prior study. Electronically signed by: Elsie Gravely MD 06/24/2024 10:23 PM EST RP Workstation: [id number]    Scheduled Meds:  amiodarone   200 mg Oral Once per day on Monday Tuesday  Wednesday Thursday Friday   ezetimibe   10 mg Oral Daily   metoprolol  tartrate  5 mg Intravenous Q8H   sodium chloride  flush  3 mL Intravenous Q12H   Continuous Infusions:  sodium chloride  100 mL/hr at 06/26/24 9660      LOS: 4 days   Fredia Skeeter, MD [facility]  06/26/2024, 7:41 AM   *Please note that this is a verbal dictation therefore any spelling or grammatical errors are due to the Dragon Medical One system interpretation.  Please page via Amion and do not message via secure chat for urgent patient care matters. Secure chat can be used for non urgent patient care matters.  How to contact the [facility] Attending or Consulting provider 7A - 7P or covering provider during after hours 7P -7A, for this patient?  Check the care team in [facility] and look for a) attending/consulting [facility] provider listed and b) the [facility] team listed. Page or secure chat 7A-7P. Log into [email or web address] and use [place]'s universal password to access. If you do not have the password, please contact the hospital operator. Locate the [facility] provider you are looking for under [facility] and page to a number that you can be directly reached. If you still have difficulty reaching the provider, please page the [facility] (Director on Call) for the Hospitalists listed on amion for assistance.  "

## 2024-06-26 NOTE — Plan of Care (Addendum)
" °  RN reported that patient has a bowel movement (moderate amount of liquid) after 7 days.  Patient being admitted for SBO currently NG tube protocol in place. -Recommended to hold up the NG tube suction however do not remove the NG tube until being seen by general surgery in the daytime.  Franklin Gethers, MD [facility] 06/26/2024, 4:36 AM   "

## 2024-06-26 NOTE — Progress Notes (Signed)
 Patient c/o moderate nausea, no orders for anti nausea medications.  Dr Sundil made aware, orders for zofran  IV given and implemented.

## 2024-06-26 NOTE — Progress Notes (Signed)
 Patient had a large form bowel movement type 3.

## 2024-06-27 ENCOUNTER — Inpatient Hospital Stay (HOSPITAL_COMMUNITY)

## 2024-06-27 DIAGNOSIS — K56609 Unspecified intestinal obstruction, unspecified as to partial versus complete obstruction: Secondary | ICD-10-CM | POA: Diagnosis not present

## 2024-06-27 MED ORDER — FUROSEMIDE 10 MG/ML IJ SOLN
40.0000 mg | Freq: Once | INTRAMUSCULAR | Status: AC
  Administered 2024-06-27: 40 mg via INTRAVENOUS
  Filled 2024-06-27: qty 4

## 2024-06-27 NOTE — Progress Notes (Signed)
 " PROGRESS NOTE    Franklin Ward  FMW:[id number] DOB: 1945-07-20 DOA: 06/22/2024 PCP: Arloa Elsie JONELLE, MD   Brief Narrative:  Franklin Ward is a 79 y.o. male with medical history significant of hypertension, hyperlipidemia, CAD status post stent, PVCs, diverticulosis with history of diverticulitis and abscess presented with nausea, vomiting, abdominal pain for the last 3 days.  Denied any fever or chills, chest pain or shortness of breath.   Hemodynamically stable upon arrival to ED. CT abdomen pelvis showed severe proximal small bowel dilation with transition zone consistent with SBO.  Diverticulosis without diverticulitis.  General surgery consulted.   Assessment & Plan:   Principal Problem:   SBO (small bowel obstruction) (HCC) Active Problems:   Benign essential HTN   PVC's (premature ventricular contractions)   Coronary arteriosclerosis   HLD (hyperlipidemia)  SBO Confirmed with abdominal series.  24-hour delay film worse today with clinically increased abdominal distention and still no bowel function.  Due to not passing flatus and increased abdominal distention, general surgery.  SBO protocol 06/24/2024 which again showed obstruction however patient started feeling better yesterday.  Patient had bowel movement on the night of 06/25/2024, NG tube clamped 06/27/2023, started on clear liquid diet, advanced on full liquid diet which she is tolerating.  Had more bowel movement yesterday and overall feels better but slightly distended abdomen today but abdominal x-ray still shows marked gaseous distention compatible with small bowel obstruction.  Surgery on board and managing.   Hypertension Continue PTA dose of p.o. metoprolol .  Continue as needed IV hydralazine .  Blood pressure controlled.   Hyperlipidemia - Continue home Zetia    CAD > Status post stenting - Continue Zetia , metoprolol  - Holding Plavix  for now in case patient progresses to requiring surgery   PVCs > History of  symptomatic PVCs. - Continue metoprolol  and amiodarone .  DVT prophylaxis: SCDs Start: 06/22/24 1406   Code Status: Full Code  Family Communication:  None present at bedside.  Plan of care discussed with patient in length and he/she verbalized understanding and agreed with it.  Status is: Inpatient Remains inpatient appropriate because: Still with SBO.   Estimated body mass index is 26.08 kg/m as calculated from the following:   Height as of this encounter: 5' 8 (1.727 m).   Weight as of this encounter: 77.8 kg.    Nutritional Assessment: Body mass index is 26.08 kg/m.SABRA Seen by dietician.  I agree with the assessment and plan as outlined below: Nutrition Status:        . Skin Assessment: I have examined the patient's skin and I agree with the wound assessment as performed by the wound care RN as outlined below:    Consultants:  General surgery  Procedures:  As above  Antimicrobials:  Anti-infectives (From admission, onward)    None         Subjective: Patient seen and examined, he states that he is feeling better, he had another bowel movement yesterday and he has been passing flatus.  No nausea.  Tolerating full liquid diet.  Objective: Vitals:   06/26/24 1109 06/26/24 1557 06/26/24 2033 06/27/24 0441  BP: (!) 160/73 (!) 142/75 (!) 154/71 133/77  Pulse: 67 80 94 86  Resp: 19 18 17 17   Temp: 98.1 F (36.7 C) (!) 97.5 F (36.4 C) 98.1 F (36.7 C) 98.4 F (36.9 C)  TempSrc:   Oral Oral  SpO2: 94% 94% 91% 91%  Weight:      Height:  Intake/Output Summary (Last 24 hours) at 06/27/2024 0748 Last data filed at 06/26/2024 1400 Gross per 24 hour  Intake 1023.61 ml  Output --  Net 1023.61 ml   Filed Weights   06/22/24 0855  Weight: 77.8 kg    Examination:  General exam: Appears calm and comfortable  Respiratory system: Clear to auscultation. Respiratory effort normal. Cardiovascular system: S1 & S2 heard, RRR. No JVD, murmurs, rubs, gallops or  clicks. No pedal edema. Gastrointestinal system: Abdomen is lightly more distended than yesterday but remains soft and nontender. No organomegaly or masses felt. Normal bowel sounds heard. Central nervous system: Alert and oriented. No focal neurological deficits. Extremities: Symmetric 5 x 5 power. Skin: No rashes, lesions or ulcers.  Psychiatry: Judgement and insight appear normal. Mood & affect appropriate.   Data Reviewed: I have personally reviewed following labs and imaging studies  CBC: Recent Labs  Lab 06/22/24 0553 06/22/24 0857 06/23/24 0846  WBC 6.4  --  6.7  NEUTROABS 3.6  --   --   HGB 17.3* 18.0* 16.1  HCT 49.8 53.0* 46.9  MCV 90.5  --  92.7  PLT 257  --  223   Basic Metabolic Panel: Recent Labs  Lab 06/22/24 0553 06/22/24 0857 06/23/24 0846 06/24/24 0626 06/26/24 0906  NA 134* 136 138 140 144  K 4.5 4.4 4.4 4.7 3.8  CL 96* 101 100 102 111  CO2 22  --  25 22 20*  GLUCOSE 105* 108* 75 74 81  BUN 46* 46* 46* 43* 32*  CREATININE 1.17 1.20 1.09 0.98 0.73  CALCIUM  10.2  --  9.3 8.8* 8.1*  MG  --   --   --  2.3  --    GFR: Estimated Creatinine Clearance: 72.4 mL/min (by C-G formula based on SCr of 0.73 mg/dL). Liver Function Tests: Recent Labs  Lab 06/22/24 0553 06/23/24 0846  AST 50* 41  ALT 23 23  ALKPHOS 55 53  BILITOT 1.2 0.8  PROT 7.8 6.9  ALBUMIN 4.4 3.8   Recent Labs  Lab 06/22/24 0553  LIPASE 24   No results for input(s): AMMONIA in the last 168 hours. Coagulation Profile: No results for input(s): INR, PROTIME in the last 168 hours. Cardiac Enzymes: No results for input(s): CKTOTAL, CKMB, CKMBINDEX, TROPONINI in the last 168 hours. BNP (last 3 results) No results for input(s): PROBNP in the last 8760 hours. HbA1C: No results for input(s): HGBA1C in the last 72 hours. CBG: No results for input(s): GLUCAP in the last 168 hours. Lipid Profile: No results for input(s): CHOL, HDL, LDLCALC, TRIG, CHOLHDL,  LDLDIRECT in the last 72 hours. Thyroid  Function Tests: No results for input(s): TSH, T4TOTAL, FREET4, T3FREE, THYROIDAB in the last 72 hours. Anemia Panel: No results for input(s): VITAMINB12, FOLATE, FERRITIN, TIBC, IRON, RETICCTPCT in the last 72 hours. Sepsis Labs: No results for input(s): PROCALCITON, LATICACIDVEN in the last 168 hours.  No results found for this or any previous visit (from the past 240 hours).   Radiology Studies: DG Abd Portable 1V Result Date: 06/26/2024 EXAM: 1 VIEW XRAY OF THE ABDOMEN 06/26/2024 06:03:00 AM COMPARISON: 06/25/2024 CLINICAL HISTORY: SBO (small bowel obstruction) (HCC) FINDINGS: LINES, TUBES AND DEVICES: Esophagogastric tube terminates in the stomach. BOWEL: Dilation of multiple small bowel loops in the abdomen, unchanged from prior study, consistent with small bowel obstruction. Maximal small bowel diameter 4.9 cm. SOFT TISSUES: No abnormal calcifications. BONES: No acute fracture. 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 IMPRESSION: 1. Unchanged appearance  of the high grade small bowel obstruction. 2. Similarly positioned esophagogastric tube terminating in the stomach. Electronically signed by: Rogelia Myers MD 06/26/2024 08:38 AM EST RP Workstation: [id number]   DG Abd Portable 1V Result Date: 06/25/2024 CLINICAL DATA:  Follow-up small bowel obstruction. EXAM: DG ABD PORTABLE 1V COMPARISON:  06/24/2024.  Abdomen and pelvis CT dated 06/22/2024. FINDINGS: Multiple dilated small bowel loops are again demonstrated with mild improvement. Decreased gaseous distention of the stomach. Nasogastric tube tip and side hole in the proximal stomach. Minimal gas and stool is again demonstrated in the right colon, normal in caliber. Thoracic and lumbar spine degenerative changes. IMPRESSION: Mildly improved small bowel obstruction. Electronically Signed   By: Elspeth Bathe M.D.   On: 06/25/2024 17:49    Scheduled Meds:   amiodarone   200 mg Oral Once per day on Monday Tuesday Wednesday Thursday Friday   cyanocobalamin   2,000 mcg Oral Daily   ezetimibe   10 mg Oral Daily   metoprolol  tartrate  50 mg Oral BID   sodium chloride  flush  3 mL Intravenous Q12H   Continuous Infusions:      LOS: 5 days   Fredia Skeeter, MD [facility]  06/27/2024, 7:48 AM   *Please note that this is a verbal dictation therefore any spelling or grammatical errors are due to the Dragon Medical One system interpretation.  Please page via Amion and do not message via secure chat for urgent patient care matters. Secure chat can be used for non urgent patient care matters.  How to contact the [facility] Attending or Consulting provider 7A - 7P or covering provider during after hours 7P -7A, for this patient?  Check the care team in [facility] and look for a) attending/consulting [facility] provider listed and b) the [facility] team listed. Page or secure chat 7A-7P. Log into [email or web address] and use [place]'s universal password to access. If you do not have the password, please contact the hospital operator. Locate the [facility] provider you are looking for under [facility] and page to a number that you can be directly reached. If you still have difficulty reaching the provider, please page the [facility] (Director on Call) for the Hospitalists listed on amion for assistance.  "

## 2024-06-27 NOTE — Evaluation (Signed)
 Physical Therapy Evaluation Patient Details Name: Franklin Ward MRN: [id number] DOB: 1946-06-06 Today's Date: 06/27/2024  History of Present Illness  79 y.o. male presents to [facility] hospital on 06/22/2024 with nausea, vomiting and abdominal pain. Imaging notable for SBO. PMH includes HTN, HLD, CAD, PVCs, diverticulosis and abscess.  Clinical Impression  Pt presents to PT with deficits in functional mobility, gait, balance, strength, power, endurance, cardiopulmonary function. Pt requires physical assistance to perform bed mobility and to transfer, fatiguing rather quickly at this time. PT notes wet voice quality and productive cough during session. After completing transfer to chair PT checks the pt's SpO2, reading at 84%. Pt requires 5L of supplemental oxygen to bring SpO2 up to 90%, RN made aware and present. PT also notes the pt to cough multiple times when drinking mild during session. PT provides incentive spirometer to aide in improving pulmonary function. The patient was independent and often caring for his spouse prior to this admission. PT recommends high intensity inpatient PT services, >3hours/day, at the time of discharge. Pt will need to demonstrate improvement in activity tolerance to remain an ideal candidate for this intensity of rehab.      If plan is discharge home, recommend the following: A lot of help with walking and/or transfers;A lot of help with bathing/dressing/bathroom;Assistance with cooking/housework;Assist for transportation;Help with stairs or ramp for entrance   Can travel by private vehicle        Equipment Recommendations BSC/3in1;Wheelchair (measurements PT);Wheelchair cushion (measurements PT)  Recommendations for Other Services  Rehab consult    Functional Status Assessment Patient has had a recent decline in their functional status and demonstrates the ability to make significant improvements in function in a reasonable and predictable amount of time.      Precautions / Restrictions Precautions Precautions: Fall Recall of Precautions/Restrictions: Intact Precaution/Restrictions Comments: hypoxia with wet cough during PT session, RN made aware Restrictions Weight Bearing Restrictions Per Provider Order: No      Mobility  Bed Mobility Overal bed mobility: Needs Assistance Bed Mobility: Supine to Sit     Supine to sit: Min assist          Transfers Overall transfer level: Needs assistance Equipment used: Rolling walker (2 wheels) Transfers: Sit to/from Stand, Bed to chair/wheelchair/BSC Sit to Stand: Min assist, From elevated surface   Step pivot transfers: Min assist            Ambulation/Gait Ambulation/Gait assistance:  (deferred due to fatigue)                Stairs            Wheelchair Mobility     Tilt Bed    Modified Rankin (Stroke Patients Only)       Balance Overall balance assessment: Needs assistance Sitting-balance support: No upper extremity supported, Feet supported Sitting balance-Leahy Scale: Fair     Standing balance support: Bilateral upper extremity supported, Reliant on assistive device for balance Standing balance-Leahy Scale: Poor                               Pertinent Vitals/Pain Pain Assessment Pain Assessment: No/denies pain    Home Living Family/patient expects to be discharged to:: Private residence Living Arrangements: Spouse/significant other Available Help at Discharge: Family;Available PRN/intermittently (pt is primary caregiver for his spouse, starting hospice services this week. Daughter and SIL live close) Type of Home: House Home Access: Stairs to enter Entrance Stairs-Rails: None  Entrance Stairs-Number of Steps: 2   Home Layout: One level Home Equipment: Agricultural Consultant (2 wheels);Cane - single point      Prior Function Prior Level of Function : Independent/Modified Independent             Mobility Comments: ambulatory with  PRN use of SPC       Extremity/Trunk Assessment   Upper Extremity Assessment Upper Extremity Assessment: Generalized weakness    Lower Extremity Assessment Lower Extremity Assessment: Generalized weakness    Cervical / Trunk Assessment Cervical / Trunk Assessment: Kyphotic  Communication   Communication Communication: Impaired (soft and wet voice at this time)    Cognition Arousal: Alert Behavior During Therapy: WFL for tasks assessed/performed   PT - Cognitive impairments: Difficult to assess                       PT - Cognition Comments: pt appears to be cognitively intact but difficult to consistently hear the patient due to very soft and wet voice Following commands: Intact       Cueing Cueing Techniques: Verbal cues     General Comments General comments (skin integrity, edema, etc.): pt on room air upon PT arrival, not on monitor. PT notes wet voice quality and frequent productive cough. After transfer pt's SpO2 found to be at 84%. Pt requires 4L of supplemental oxygen to raise sats to 88% and 5L to get sats to stay at 90%, RN present and aware.    Exercises Other Exercises Other Exercises: PT provides patient with incentive spirometer, education provided on use to improve pulmonary function   Assessment/Plan    PT Assessment Patient needs continued PT services  PT Problem List Decreased strength;Decreased activity tolerance;Decreased balance;Decreased mobility;Decreased knowledge of use of DME;Cardiopulmonary status limiting activity       PT Treatment Interventions DME instruction;Gait training;Stair training;Functional mobility training;Therapeutic activities;Therapeutic exercise;Neuromuscular re-education;Balance training;Patient/family education    PT Goals (Current goals can be found in the Care Plan section)  Acute Rehab PT Goals Patient Stated Goal: to return to independence PT Goal Formulation: With patient Time For Goal Achievement:  07/11/24 Potential to Achieve Goals: Fair    Frequency Min 2X/week     Co-evaluation               AM-PAC PT 6 Clicks Mobility  Outcome Measure Help needed turning from your back to your side while in a flat bed without using bedrails?: A Little Help needed moving from lying on your back to sitting on the side of a flat bed without using bedrails?: A Little Help needed moving to and from a bed to a chair (including a wheelchair)?: A Little Help needed standing up from a chair using your arms (e.g., wheelchair or bedside chair)?: A Little Help needed to walk in hospital room?: Total Help needed climbing 3-5 steps with a railing? : Total 6 Click Score: 14    End of Session Equipment Utilized During Treatment: Gait belt;Oxygen Activity Tolerance: Patient limited by fatigue Patient left: in chair;with call bell/phone within reach;with chair alarm set Nurse Communication: Mobility status PT Visit Diagnosis: Other abnormalities of gait and mobility (R26.89);Muscle weakness (generalized) (M62.81)    Time: 8291-8259 PT Time Calculation (min) (ACUTE ONLY): 32 min   Charges:   PT Evaluation $PT Eval Low Complexity: 1 Low PT Treatments $Therapeutic Activity: 8-22 mins PT General Charges $$ ACUTE PT VISIT: 1 Visit         Bernardino JINNY Ruth, PT,  DPT Acute Rehabilitation Office [phone]   Bernardino JINNY Ruth 06/27/2024, 6:17 PM

## 2024-06-27 NOTE — Progress Notes (Signed)
 "  Progress Note     Subjective: Patient sitting on bedside commode during encounter.   NGT came out. Patient report nausea yesterday. Denies nausea currently. Denies vomiting. Has tolerated FLD. BM reported at 6:00PM yesterday. Having flatulence. Having generalized abdominal pain that has not worsened.  ROS  All negative with the exception of above.  Objective: Vital signs in last 24 hours: Temp:  [97.5 F (36.4 C)-98.5 F (36.9 C)] 98.5 F (36.9 C) (01/07 0830) Pulse Rate:  [67-94] 86 (01/07 0830) Resp:  [17-20] 20 (01/07 0830) BP: (133-160)/(71-84) 145/84 (01/07 0830) SpO2:  [91 %-94 %] 92 % (01/07 0830) Last BM Date : 06/26/24  Intake/Output from previous day: 01/06 0701 - 01/07 0700 In: 1023.6 [I.V.:1023.6] Out: 200 [Urine:200] Intake/Output this shift: No intake/output data recorded.  PE: General: Pleasant male who is laying in bed in NAD. HEENT: Head is normocephalic, atraumatic. NGT no longer in place. Heart: HR normal during encounter.  Lungs: Respiratory effort nonlabored. Abd: Soft with distention. Generalized tenderness to palpation. No rebound tenderness or guarding.  MS: Able to move all 4 extremities.  Skin: Warm and dry.  Psych: A&Ox3 with an appropriate affect.   Lab Results:  No results for input(s): WBC, HGB, HCT, PLT in the last 72 hours. BMET Recent Labs    06/26/24 0906  NA 144  K 3.8  CL 111  CO2 20*  GLUCOSE 81  BUN 32*  CREATININE 0.73  CALCIUM  8.1*   PT/INR No results for input(s): LABPROT, INR in the last 72 hours. CMP     Component Value Date/Time   NA 144 06/26/2024 0906   NA 137 03/22/2024 0955   K 3.8 06/26/2024 0906   CL 111 06/26/2024 0906   CO2 20 (L) 06/26/2024 0906   GLUCOSE 81 06/26/2024 0906   BUN 32 (H) 06/26/2024 0906   BUN 16 03/22/2024 0955   CREATININE 0.73 06/26/2024 0906   CALCIUM  8.1 (L) 06/26/2024 0906   PROT 6.9 06/23/2024 0846   PROT 7.2 03/22/2024 0955   ALBUMIN 3.8 06/23/2024 0846    ALBUMIN 4.6 03/22/2024 0955   AST 41 06/23/2024 0846   ALT 23 06/23/2024 0846   ALKPHOS 53 06/23/2024 0846   BILITOT 0.8 06/23/2024 0846   BILITOT 0.3 03/22/2024 0955   GFRNONAA >60 06/26/2024 0906   GFRAA 104 08/21/2018 1311   Lipase     Component Value Date/Time   LIPASE 24 06/22/2024 0553       Studies/Results: DG Abd Portable 1V Result Date: 06/27/2024 CLINICAL DATA:  Small bowel obstruction. EXAM: PORTABLE ABDOMEN - 1 VIEW COMPARISON:  06/26/2024 FINDINGS: Stomach is markedly distended with gas. There is marked gaseous distention of small bowel measuring up to 5.5 cm diameter, similar to mildly progressive in the interval. Gas is visible in the rectum with gas and stool seen scattered along the length of a nondistended colon. IMPRESSION: Marked gaseous distention of the stomach and small bowel, similar to mildly progressive in the interval. Imaging features remain compatible with small bowel obstruction. Electronically Signed   By: Camellia Candle M.D.   On: 06/27/2024 08:10   DG Abd Portable 1V Result Date: 06/26/2024 EXAM: 1 VIEW XRAY OF THE ABDOMEN 06/26/2024 06:03:00 AM COMPARISON: 06/25/2024 CLINICAL HISTORY: SBO (small bowel obstruction) (HCC) FINDINGS: LINES, TUBES AND DEVICES: Esophagogastric tube terminates in the stomach. BOWEL: Dilation of multiple small bowel loops in the abdomen, unchanged from prior study, consistent with small bowel obstruction. Maximal small bowel diameter 4.9 cm. SOFT TISSUES:  No abnormal calcifications. BONES: No acute fracture. 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 IMPRESSION: 1. Unchanged appearance of the high grade small bowel obstruction. 2. Similarly positioned esophagogastric tube terminating in the stomach. Electronically signed by: Rogelia Myers MD 06/26/2024 08:38 AM EST RP Workstation: [id number]    Anti-infectives: Anti-infectives (From admission, onward)    None        Assessment/Plan SBO -No previous  abdominal surgery -Underwent SBO protocol again 1/5.  -8 hr film showed continued small bowel obstruction with no improvement compared to previous study.  -Most recent abdominal xray showing continued dilation of bowel.  -Afebrile. -NGT came out yesterday/overnight. -Some generalized abdominal pain. Denies n/v currently. Having flatulence. CM reported yesterday afternoon. Tolerating FLD. -If experiences nausea, increased abdominal pain, vomiting, or increased distention, recommend replacing NGT. -If symptoms do not resolve, surgical intervention will need to be considered. -D/w patient who expresses understanding. -Will continue to follow.    FEN - FLD, NGT fell out, IVFs per primary  VTE - hold plavix , chemical prophylaxis ok from our standpoint ID - none currently  needed   CAD H/o NSTEMI diverticulosis   LOS: 5 days   I reviewed specialist notes, hospitalist notes, nursing notes, last 24 h vitals and pain scores, last 48 h intake and output, last 24 h labs and trends, and last 24 h imaging results.  This care required moderate level of medical decision making.    Marjorie Carlyon Favre, [facility] 06/27/2024, 8:39 AM Please see Amion for pager number during day hours 7:00am-4:30pm  "

## 2024-06-27 NOTE — Plan of Care (Signed)

## 2024-06-27 NOTE — TOC Initial Note (Addendum)
 Transition of Care [facility]) - Initial/Assessment Note    Patient Details  Name: Franklin Ward MRN: [id number] Date of Birth: 1946-05-30  Transition of Care [facility]) CM/SW Contact:    Marval Gell, RN Phone Number: 06/27/2024, 12:19 PM  Clinical Narrative:                  Patient admitted for SBO, now w NGT out, and FLD. Spoke to him in room, very difficult to understand, was speaking at a whisper.  Attempted to call contact listed, left a VM. Spoke w nurse who confirms that he is week, requested PT OT evals from provider.  Per notes, [facility] is following for potential palliative care after DC.   16:30 received call from daughter, Jon, inquiring about custodial/ private duty care through [facility] after discharge as she was informed by [facility] who is caring for her mother- pt's wife- for hospice services, that hiospital can set this up.  Called [facility] Medicare with number provided by daughter from insurance card [phone]. Spoke w [employer] rep who could not verify that they provide any custodial services through private duty care. Updated daughter. Discussed that I requested PT OT evals from MD earlier today and we can set up [facility] services prior to discharge. Encouraged her to look up rating for [facility] agencies on [email or web address]   Expected Discharge Plan: Home w Home Health Services Barriers to Discharge: Continued Medical Work up   Patient Goals and CMS Choice            Expected Discharge Plan and Services   Discharge Planning Services: CM Consult   Living arrangements for the past 2 months: Single Family Home                                      Prior Living Arrangements/Services Living arrangements for the past 2 months: Single Family Home Lives with:: Spouse                   Activities of Daily Living   ADL Screening (condition at time of admission) Independently performs ADLs?: Yes (appropriate for developmental age) Is the patient deaf or have difficulty  hearing?: No Does the patient have difficulty seeing, even when wearing glasses/contacts?: No Does the patient have difficulty concentrating, remembering, or making decisions?: No  Permission Sought/Granted                  Emotional Assessment              Admission diagnosis:  Small bowel obstruction (HCC) [K56.609] SBO (small bowel obstruction) (HCC) [K56.609] Complete intestinal obstruction, unspecified cause (HCC) [K56.601] Patient Active Problem List   Diagnosis Date Noted   Coronary arteriosclerosis 06/22/2024   SBO (small bowel obstruction) (HCC) 06/22/2024   PVC's (premature ventricular contractions) 05/13/2020   Benign essential HTN 08/09/2018   Abscess of sigmoid colon due to diverticulitis 07/06/2016   HLD (hyperlipidemia) 04/06/2013   PCP:  Arloa Elsie JONELLE, MD Pharmacy:   [facility] [place], [place] - [address] [place] [address] Phone: [phone] Fax: [phone]     Social Drivers of Health (SDOH) Social History: SDOH Screenings   Food Insecurity: No Food Insecurity (06/22/2024)  Housing: Low Risk (06/22/2024)  Transportation Needs: No Transportation Needs (06/22/2024)  Utilities: Not At Risk (06/22/2024)  Social Connections: Moderately Isolated (06/22/2024)  Tobacco Use: Medium Risk (06/22/2024)   SDOH Interventions:     Readmission Risk Interventions     No data to display

## 2024-06-27 NOTE — Progress Notes (Signed)
 During rounding found that patient had removed NG tube from nare unintentionally.  No trauma, currently no nausea or vomiting.  Dr Sundil made aware, order received to leave out for now.  Will monitor closely for nausea, vomiting, increased abdominal distention.

## 2024-06-28 ENCOUNTER — Inpatient Hospital Stay (HOSPITAL_COMMUNITY)

## 2024-06-28 DIAGNOSIS — K56609 Unspecified intestinal obstruction, unspecified as to partial versus complete obstruction: Secondary | ICD-10-CM | POA: Diagnosis not present

## 2024-06-28 LAB — BASIC METABOLIC PANEL WITH GFR
Anion gap: 12 (ref 5–15)
BUN: 45 mg/dL — ABNORMAL HIGH (ref 8–23)
CO2: 20 mmol/L — ABNORMAL LOW (ref 22–32)
Calcium: 8.9 mg/dL (ref 8.9–10.3)
Chloride: 108 mmol/L (ref 98–111)
Creatinine, Ser: 1.37 mg/dL — ABNORMAL HIGH (ref 0.61–1.24)
GFR, Estimated: 52 mL/min — ABNORMAL LOW
Glucose, Bld: 104 mg/dL — ABNORMAL HIGH (ref 70–99)
Potassium: 4.6 mmol/L (ref 3.5–5.1)
Sodium: 140 mmol/L (ref 135–145)

## 2024-06-28 LAB — CBC WITH DIFFERENTIAL/PLATELET
Abs Immature Granulocytes: 0.1 K/uL — ABNORMAL HIGH (ref 0.00–0.07)
Basophils Absolute: 0.1 K/uL (ref 0.0–0.1)
Basophils Relative: 1 %
Eosinophils Absolute: 0.1 K/uL (ref 0.0–0.5)
Eosinophils Relative: 1 %
HCT: 49.9 % (ref 39.0–52.0)
Hemoglobin: 16.2 g/dL (ref 13.0–17.0)
Immature Granulocytes: 1 %
Lymphocytes Relative: 14 %
Lymphs Abs: 1.5 K/uL (ref 0.7–4.0)
MCH: 30.7 pg (ref 26.0–34.0)
MCHC: 32.5 g/dL (ref 30.0–36.0)
MCV: 94.5 fL (ref 80.0–100.0)
Monocytes Absolute: 0.9 K/uL (ref 0.1–1.0)
Monocytes Relative: 8 %
Neutro Abs: 8.3 K/uL — ABNORMAL HIGH (ref 1.7–7.7)
Neutrophils Relative %: 75 %
Platelets: 246 K/uL (ref 150–400)
RBC: 5.28 MIL/uL (ref 4.22–5.81)
RDW: 13.9 % (ref 11.5–15.5)
WBC: 10.9 K/uL — ABNORMAL HIGH (ref 4.0–10.5)
nRBC: 0 % (ref 0.0–0.2)

## 2024-06-28 MED ORDER — SODIUM CHLORIDE 0.9 % IV SOLN
3.0000 g | Freq: Four times a day (QID) | INTRAVENOUS | Status: DC
  Administered 2024-06-28 – 2024-07-05 (×27): 3 g via INTRAVENOUS
  Filled 2024-06-28 (×30): qty 8

## 2024-06-28 MED ORDER — BISACODYL 10 MG RE SUPP
10.0000 mg | Freq: Once | RECTAL | Status: AC
  Administered 2024-06-28: 10 mg via RECTAL
  Filled 2024-06-28: qty 1

## 2024-06-28 NOTE — Plan of Care (Signed)

## 2024-06-28 NOTE — Progress Notes (Addendum)
 "  Progress Note     Subjective: Discussed with nursing staff: Pt had episode during PT session yesterday in which his abdomen became more distended and he was experiencing abdominal pain. Patient required oxygen therapy. Accordingly, NGT was replaced.  Discussed these events with patient, but he states that he is unsure why NGT was replaced. Currently, he denies pain, nausea, vomiting. Reports that abdominal distention is better. Has not had bowel movement since 1/6. Having some flatulence.   ROS  All negative with the exception of above.  Objective: Vital signs in last 24 hours: Temp:  [98.2 F (36.8 C)-98.8 F (37.1 C)] 98.2 F (36.8 C) (01/08 0433) Pulse Rate:  [74-94] 74 (01/08 0752) Resp:  [17-20] 18 (01/08 0752) BP: (124-145)/(67-84) 143/74 (01/08 0752) SpO2:  [90 %-99 %] 96 % (01/08 0752) Last BM Date : 06/26/24  Intake/Output from previous day: 01/07 0701 - 01/08 0700 In: 600 [P.O.:600] Out: 1700 [Urine:200; Emesis/NG output:1500] Intake/Output this shift: Total I/O In: -  Out: 950 [Emesis/NG output:950]  PE: General: Pleasant male who is laying in bed in NAD. HEENT: Head is normocephalic, atraumatic. NGT in place. Heart: HR normal during encounter.  Lungs: Respiratory effort nonlabored on O2. Abd: Soft with mild distention. NTTP. No rebound tenderness or guarding.  MS: Able to move all 4 extremities.  Skin: Warm and dry.  Psych: A&Ox3 with an appropriate affect.   Lab Results:  No results for input(s): WBC, HGB, HCT, PLT in the last 72 hours. BMET Recent Labs    06/26/24 0906  NA 144  K 3.8  CL 111  CO2 20*  GLUCOSE 81  BUN 32*  CREATININE 0.73  CALCIUM  8.1*   PT/INR No results for input(s): LABPROT, INR in the last 72 hours. CMP     Component Value Date/Time   NA 144 06/26/2024 0906   NA 137 03/22/2024 0955   K 3.8 06/26/2024 0906   CL 111 06/26/2024 0906   CO2 20 (L) 06/26/2024 0906   GLUCOSE 81 06/26/2024 0906   BUN 32 (H)  06/26/2024 0906   BUN 16 03/22/2024 0955   CREATININE 0.73 06/26/2024 0906   CALCIUM  8.1 (L) 06/26/2024 0906   PROT 6.9 06/23/2024 0846   PROT 7.2 03/22/2024 0955   ALBUMIN  3.8 06/23/2024 0846   ALBUMIN  4.6 03/22/2024 0955   AST 41 06/23/2024 0846   ALT 23 06/23/2024 0846   ALKPHOS 53 06/23/2024 0846   BILITOT 0.8 06/23/2024 0846   BILITOT 0.3 03/22/2024 0955   GFRNONAA >60 06/26/2024 0906   GFRAA 104 08/21/2018 1311   Lipase     Component Value Date/Time   LIPASE 24 06/22/2024 0553       Studies/Results: DG Abd 1 View Result Date: 06/27/2024 EXAM: 1 VIEW XRAY OF THE ABDOMEN 06/27/2024 06:45:00 PM COMPARISON: 06/27/2024 CLINICAL HISTORY: Encounter for nasogastric (NG) tube placement FINDINGS: LINES, TUBES AND DEVICES: Enteric tube in place with tip and side port overlying the expected region of the gastric lumen. BOWEL: Multiple dilated loops of small bowel in the visualized upper abdomen consistent with possible small-bowel obstruction. SOFT TISSUES: No abnormal calcifications. BONES: No acute fracture. LUNGS AND PLEURAL SPACES: Trace left pleural effusion. Bibasilar atelectasis. IMPRESSION: 1. Enteric tube in place with tip and side port overlying the expected region of the gastric lumen. 2. Possible small-bowel obstruction. 3. Trace left pleural effusion with bibasilar atelectasis. Electronically signed by: Greig Pique MD MD 06/27/2024 08:11 PM EST RP Workstation: [id number]   DG CHEST PORT 1  VIEW Result Date: 06/27/2024 EXAM: 1 VIEW(S) XRAY OF THE CHEST 06/27/2024 06:45:00 PM COMPARISON: 03/22/2024 CLINICAL HISTORY: 200810 Hypoxic 200810 FINDINGS: LINES, TUBES AND DEVICES: Enteric tube in place with distal tip beyond the inferior margin of the film. LUNGS AND PLEURA: Low lung volumes. Bibasilar airspace opacities, could represent atelectasis or pneumonia. No pleural effusion. No pneumothorax. HEART AND MEDIASTINUM: Aortic atherosclerosis. No acute abnormality of the cardiac and  mediastinal silhouettes. BONES AND SOFT TISSUES: No acute osseous abnormality. IMPRESSION: 1. Bibasilar airspace opacities, favored atelectasis though pneumonia is not excluded. 2. Enteric tube tip projects beyond the inferior margin of the film. Electronically signed by: Greig Pique MD MD 06/27/2024 08:10 PM EST RP Workstation: [id number]   DG Abd Portable 1V Result Date: 06/27/2024 CLINICAL DATA:  Small bowel obstruction. EXAM: PORTABLE ABDOMEN - 1 VIEW COMPARISON:  06/26/2024 FINDINGS: Stomach is markedly distended with gas. There is marked gaseous distention of small bowel measuring up to 5.5 cm diameter, similar to mildly progressive in the interval. Gas is visible in the rectum with gas and stool seen scattered along the length of a nondistended colon. IMPRESSION: Marked gaseous distention of the stomach and small bowel, similar to mildly progressive in the interval. Imaging features remain compatible with small bowel obstruction. Electronically Signed   By: Camellia Candle M.D.   On: 06/27/2024 08:10    Anti-infectives: Anti-infectives (From admission, onward)    None        Assessment/Plan SBO -No previous abdominal surgery -Underwent SBO protocol again 1/5.  -8 hr film showed continued small bowel obstruction with no improvement compared to previous study.  -Most recent abdominal xray showing continued dilation of bowel.  -Planning for repeat xray this AM due to replacement of NGT yesterday after having abdominal pain, distention. -Currently without n/v, improved distention, and reports no pain. Has not had BM since 1/6. Having flatulence.  -Afebrile. -Recommend NPO and maintaining NGT.  -If symptoms do not resolve, surgical intervention will need to be considered. -D/w patient who expresses understanding. -Will continue to follow.    FEN - NPO/NGT, IVFs per primary  VTE - hold plavix , chemical prophylaxis ok from our standpoint ID - none currently  needed   CAD H/o  NSTEMI diverticulosis  ADDENDUM: Patient further discussed and examined with attending. Xray this AM showing contrast throughout the colon. Patient without n/v, abdominal pain, and feels that he is not distended. We discussed removing NGT tube and letting him trial FLD again. If he does not tolerate this, will plan for OR possibly tomorrow. Patient agrees with plan and all questions were answered. This was communicated to primary team and patient's nurse.    LOS: 6 days   I reviewed specialist notes, hospitalist notes, nursing notes, last 24 h vitals and pain scores, last 48 h intake and output, last 24 h labs and trends, and last 24 h imaging results.  This care required moderate level of medical decision making.    Marjorie Carlyon Favre, [facility] 06/28/2024, 8:21 AM Please see Amion for pager number during day hours 7:00am-4:30pm  "

## 2024-06-28 NOTE — Progress Notes (Signed)
 Inpatient Rehab Admissions Coordinator:   Per therapy recommendations pt was screened for CIR by Reche Lowers, PT, DPT.  Chart reviewed: pt admitted with abdominal pain, workup revealing SBO without perforation.  SBO protocol with general surgery following.  Potential for surgery tomorrow if pt does not improve.  Would favor seeing what, if any, surgical management is needed.  With pt's current presentation, if he improves in the next day or so would not likely be able to get insurance approval with [facility] Medicare.  Will rescreen in 1-2 days.   Reche Lowers, PT, DPT Admissions Coordinator [phone] 06/28/2024 11:55 AM

## 2024-06-28 NOTE — Progress Notes (Signed)
 " PROGRESS NOTE    Franklin Ward  FMW:[id number] DOB: 24-Apr-1946 DOA: 06/22/2024 PCP: Arloa Elsie JONELLE, MD   Brief Narrative:  Franklin Ward is a 79 y.o. male with medical history significant of hypertension, hyperlipidemia, CAD status post stent, PVCs, diverticulosis with history of diverticulitis and abscess presented with nausea, vomiting, abdominal pain for the last 3 days.  Denied any fever or chills, chest pain or shortness of breath.   Hemodynamically stable upon arrival to ED. CT abdomen pelvis showed severe proximal small bowel dilation with transition zone consistent with SBO.  Diverticulosis without diverticulitis.  General surgery consulted.   Assessment & Plan:   Principal Problem:   SBO (small bowel obstruction) (HCC) Active Problems:   Benign essential HTN   PVC's (premature ventricular contractions)   Coronary arteriosclerosis   HLD (hyperlipidemia)  SBO Confirmed with abdominal series.  24-hour delay film worse today with clinically increased abdominal distention and still no bowel function.  Due to not passing flatus and increased abdominal distention, general surgery.  SBO protocol 06/24/2024 which again showed obstruction however patient started feeling better yesterday.  Patient had bowel movement on the night of 06/25/2024, NG tube clamped 06/27/2023, started on clear liquid diet, which was eventually advanced to full liquid diet, patient had bowel movement 2 days back-to-back however on the late afternoon of 06/27/2024, patient was noted to have aspiration and becoming hypoxic with worsened abdominal distention so NG tube was reinserted.  General surgery has reassessed him today, patient has been passing flatus, x-ray shows that the contrast is in the colon, neurosurgery believes he is improving.  They are planning to remove NG tube and start on the liquid diet again.  If no improvement by tomorrow, surgical intervention will be considered.  General surgery will update  family.  Acute hypoxic respiratory failure secondary to aspiration pneumonia, not POA: On late afternoon of 06/27/2024, while working with PT patient was noted to have possible aspiration, became hypoxic with oxygen saturation of 85% requiring 5 L of oxygen.  X-ray was obtained which shows bibasilar airspace opacities which could be pneumonia or atelectasis.  Patient this morning is complaining of sore throat and he appears to have upper airway sounds and bilateral rhonchi as well.  I am concerned about his swallowing ability.  I have consulted SLP.  Waiting for CBC to check for leukocytosis.  Clinically, he does appear to have aspiration pneumonia so I will empirically start on Unasyn .  Wean oxygen as able to.  Provide incentive spirometry.   Hypertension Continue home dose of metoprolol  as well as needed IV hydralazine .  Blood pressure controlled.   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 - Continue metoprolol  and amiodarone .    DVT prophylaxis: SCDs Start: 06/22/24 1406   Code Status: Full Code  Family Communication:  None present at bedside.  Plan of care discussed with patient in length and he/she verbalized understanding and agreed with it.  Status is: Inpatient Remains inpatient appropriate because: Still with SBO.   Estimated body mass index is 26.08 kg/m as calculated from the following:   Height as of this encounter: 5' 8 (1.727 m).   Weight as of this encounter: 77.8 kg.    Nutritional Assessment: Body mass index is 26.08 kg/m.SABRA Seen by dietician.  I agree with the assessment and plan  as outlined below: Nutrition Status:        . Skin Assessment: I have examined the patient's skin and I agree with the wound assessment as performed by the wound care RN as outlined below:    Consultants:  General  surgery  Procedures:  As above  Antimicrobials:  Anti-infectives (From admission, onward)    None         Subjective: Patient seen and examined.  He says that he feels well.  He has not had any bowel movement since the 1 he had day before yesterday but has been passing flatus.  No nausea.  Denied any shortness of breath as well.  Objective: Vitals:   06/27/24 1933 06/27/24 1953 06/28/24 0433 06/28/24 0752  BP:  128/71 124/67 (!) 143/74  Pulse:  82 74 74  Resp:  17 17 18   Temp:  98.8 F (37.1 C) 98.2 F (36.8 C)   TempSrc:  Oral Oral   SpO2: 94% 94% 99% 96%  Weight:      Height:        Intake/Output Summary (Last 24 hours) at 06/28/2024 1115 Last data filed at 06/28/2024 0726 Gross per 24 hour  Intake 600 ml  Output 2650 ml  Net -2050 ml   Filed Weights   06/22/24 0855  Weight: 77.8 kg    Examination:  General exam: Appears calm and comfortable but very tired Respiratory system: Rhonchi bilaterally as well as upper airway audible sounds. Cardiovascular system: S1 & S2 heard, RRR. No JVD, murmurs, rubs, gallops or clicks. No pedal edema. Gastrointestinal system: Abdomen is soft, nondistended and nontender. No organomegaly or masses felt. Normal bowel sounds heard. Central nervous system: Alert and oriented. No focal neurological deficits. Extremities: Symmetric 5 x 5 power. Skin: No rashes, lesions or ulcers.  Psychiatry: Judgement and insight appear normal. Mood & affect appropriate.    Data Reviewed: I have personally reviewed following labs and imaging studies  CBC: Recent Labs  Lab 06/22/24 0553 06/22/24 0857 06/23/24 0846  WBC 6.4  --  6.7  NEUTROABS 3.6  --   --   HGB 17.3* 18.0* 16.1  HCT 49.8 53.0* 46.9  MCV 90.5  --  92.7  PLT 257  --  223   Basic Metabolic Panel: Recent Labs  Lab 06/22/24 0553 06/22/24 0857 06/23/24 0846 06/24/24 0626 06/26/24 0906  NA 134* 136 138 140 144  K 4.5 4.4 4.4 4.7 3.8  CL 96* 101 100 102 111  CO2 22  --   25 22 20*  GLUCOSE 105* 108* 75 74 81  BUN 46* 46* 46* 43* 32*  CREATININE 1.17 1.20 1.09 0.98 0.73  CALCIUM  10.2  --  9.3 8.8* 8.1*  MG  --   --   --  2.3  --    GFR: Estimated Creatinine Clearance: 72.4 mL/min (by C-G formula based on SCr of 0.73 mg/dL). Liver Function Tests: Recent Labs  Lab 06/22/24 0553 06/23/24 0846  AST 50* 41  ALT 23 23  ALKPHOS 55 53  BILITOT 1.2 0.8  PROT 7.8 6.9  ALBUMIN  4.4 3.8   Recent Labs  Lab 06/22/24 0553  LIPASE 24   No results for input(s): AMMONIA in the last 168 hours. Coagulation Profile: No results for input(s): INR, PROTIME in the last 168 hours. Cardiac Enzymes: No results for input(s): CKTOTAL, CKMB, CKMBINDEX, TROPONINI in the last 168 hours. BNP (last 3 results) No results for input(s): PROBNP in the last 8760 hours. HbA1C: No results  for input(s): HGBA1C in the last 72 hours. CBG: No results for input(s): GLUCAP in the last 168 hours. Lipid Profile: No results for input(s): CHOL, HDL, LDLCALC, TRIG, CHOLHDL, LDLDIRECT in the last 72 hours. Thyroid  Function Tests: No results for input(s): TSH, T4TOTAL, FREET4, T3FREE, THYROIDAB in the last 72 hours. Anemia Panel: No results for input(s): VITAMINB12, FOLATE, FERRITIN, TIBC, IRON, RETICCTPCT in the last 72 hours. Sepsis Labs: No results for input(s): PROCALCITON, LATICACIDVEN in the last 168 hours.  No results found for this or any previous visit (from the past 240 hours).   Radiology Studies: DG Abd 1 View Result Date: 06/27/2024 EXAM: 1 VIEW XRAY OF THE ABDOMEN 06/27/2024 06:45:00 PM COMPARISON: 06/27/2024 CLINICAL HISTORY: Encounter for nasogastric (NG) tube placement FINDINGS: LINES, TUBES AND DEVICES: Enteric tube in place with tip and side port overlying the expected region of the gastric lumen. BOWEL: Multiple dilated loops of small bowel in the visualized upper abdomen consistent with possible small-bowel  obstruction. SOFT TISSUES: No abnormal calcifications. BONES: No acute fracture. LUNGS AND PLEURAL SPACES: Trace left pleural effusion. Bibasilar atelectasis. IMPRESSION: 1. Enteric tube in place with tip and side port overlying the expected region of the gastric lumen. 2. Possible small-bowel obstruction. 3. Trace left pleural effusion with bibasilar atelectasis. Electronically signed by: Greig Pique MD MD 06/27/2024 08:11 PM EST RP Workstation: [id number]   DG CHEST PORT 1 VIEW Result Date: 06/27/2024 EXAM: 1 VIEW(S) XRAY OF THE CHEST 06/27/2024 06:45:00 PM COMPARISON: 03/22/2024 CLINICAL HISTORY: 200810 Hypoxic 200810 FINDINGS: LINES, TUBES AND DEVICES: Enteric tube in place with distal tip beyond the inferior margin of the film. LUNGS AND PLEURA: Low lung volumes. Bibasilar airspace opacities, could represent atelectasis or pneumonia. No pleural effusion. No pneumothorax. HEART AND MEDIASTINUM: Aortic atherosclerosis. No acute abnormality of the cardiac and mediastinal silhouettes. BONES AND SOFT TISSUES: No acute osseous abnormality. IMPRESSION: 1. Bibasilar airspace opacities, favored atelectasis though pneumonia is not excluded. 2. Enteric tube tip projects beyond the inferior margin of the film. Electronically signed by: Greig Pique MD MD 06/27/2024 08:10 PM EST RP Workstation: [id number]   DG Abd Portable 1V Result Date: 06/27/2024 CLINICAL DATA:  Small bowel obstruction. EXAM: PORTABLE ABDOMEN - 1 VIEW COMPARISON:  06/26/2024 FINDINGS: Stomach is markedly distended with gas. There is marked gaseous distention of small bowel measuring up to 5.5 cm diameter, similar to mildly progressive in the interval. Gas is visible in the rectum with gas and stool seen scattered along the length of a nondistended colon. IMPRESSION: Marked gaseous distention of the stomach and small bowel, similar to mildly progressive in the interval. Imaging features remain compatible with small bowel obstruction. Electronically  Signed   By: Camellia Candle M.D.   On: 06/27/2024 08:10    Scheduled Meds:  amiodarone   200 mg Oral Once per day on Monday Tuesday Wednesday Thursday Friday   cyanocobalamin   2,000 mcg Oral Daily   ezetimibe   10 mg Oral Daily   metoprolol  tartrate  50 mg Oral BID   sodium chloride  flush  3 mL Intravenous Q12H   Continuous Infusions:      LOS: 6 days   Fredia Skeeter, MD [facility]  06/28/2024, 11:15 AM   *Please note that this is a verbal dictation therefore any spelling or grammatical errors are due to the Dragon Medical One system interpretation.  Please page via Amion and do not message via secure chat for urgent patient care matters. Secure chat can be used for non urgent patient care matters.  How to contact the [facility] Attending or Consulting provider 7A - 7P or covering provider during after hours 7P -7A, for this patient?  Check the care team in [facility] and look for a) attending/consulting [facility] provider listed and b) the [facility] team listed. Page or secure chat 7A-7P. Log into [email or web address] and use [facility]'s universal password to access. If you do not have the password, please contact the hospital operator. Locate the [facility] provider you are looking for under [facility] and page to a number that you can be directly reached. If you still have difficulty reaching the provider, please page the [facility] (Director on Call) for the Hospitalists listed on amion for assistance.  "

## 2024-06-28 NOTE — Progress Notes (Signed)
 Physical Therapy Treatment Patient Details Name: Franklin Ward MRN: [id number] DOB: 13-Dec-1945 Today's Date: 06/28/2024   History of Present Illness 79 y.o. male presents to [facility] hospital on 06/22/2024 with nausea, vomiting and abdominal pain. Imaging notable for SBO. PMH includes HTN, HLD, CAD, PVCs, diverticulosis and abscess.    PT Comments  Pt greeted seated in recliner chair, pleasant and agreeable to PT session. He advance OOB mobility ambulating into the hallway using RW. Pt demonstrated a step-through gait pattern. He required cues for closer proximity to RW, improved technique, increased safety awareness, and PLB. Pt demonstrated a quick turn when changing directions becoming slightly unsteady, but no overt LOB. Weaned pt to RA with SpO2 89-95%. Reviewed incentive spirometer and instructed pt to complete 10 reps every hour he is awake. Encouraged frequent ambulation with staff assistance, OOB<>chair 3x/day, and going into the restroom when he needs to use the bathroom. Will continue to follow acutely and advance appropriately.    If plan is discharge home, recommend the following: Assistance with cooking/housework;Assist for transportation;Help with stairs or ramp for entrance;A little help with walking and/or transfers;A little help with bathing/dressing/bathroom   Can travel by private vehicle        Equipment Recommendations  BSC/3in1    Recommendations for Other Services       Precautions / Restrictions Precautions Precautions: Fall Recall of Precautions/Restrictions: Intact Restrictions Weight Bearing Restrictions Per Provider Order: No     Mobility  Bed Mobility               General bed mobility comments: Not assessed. Pt greeted seated in recliner chair and returned there at end of session.    Transfers Overall transfer level: Needs assistance Equipment used: Rolling walker (2 wheels) Transfers: Sit to/from Stand Sit to Stand: Min assist            General transfer comment: Pt stood from recliner chair. Cued proper hand placement using RW. Powered up with minA. Fair eccentric control, reaching back to arm rest.    Ambulation/Gait Ambulation/Gait assistance: Min assist Gait Distance (Feet): 80 Feet (x2, standing rest break between bouts) Assistive device: Rolling walker (2 wheels) Gait Pattern/deviations: Step-through pattern, Decreased step length - right, Decreased step length - left, Decreased stride length, Trunk flexed Gait velocity: decr Gait velocity interpretation: <1.8 ft/sec, indicate of risk for recurrent falls   General Gait Details: Pt ambulated with short slow steps. He demonstrated a fwd lean. Cues for upright posture and closer proximity to RW. Pt navigated room/hallway well, no LOB.   Stairs             Wheelchair Mobility     Tilt Bed    Modified Rankin (Stroke Patients Only)       Balance Overall balance assessment: Needs assistance Sitting-balance support: No upper extremity supported, Feet supported Sitting balance-Leahy Scale: Fair     Standing balance support: Reliant on assistive device for balance, Bilateral upper extremity supported, During functional activity Standing balance-Leahy Scale: Poor Standing balance comment: Pt dependent on RW and minA                            Communication Communication Communication: Impaired Factors Affecting Communication: Difficulty expressing self  Cognition Arousal: Alert Behavior During Therapy: WFL for tasks assessed/performed   PT - Cognitive impairments: No apparent impairments  Following commands: Intact      Cueing Cueing Techniques: Verbal cues  Exercises      General Comments General comments (skin integrity, edema, etc.): Pt greeted on 5L O2 via [place]. Weaned to RA with SpO2 89-95% while ambulating. Cued PLB technique. Reviewed incentive spirometer use and instructed pt to complete 10  reps every hour.      Pertinent Vitals/Pain Pain Assessment Pain Assessment: No/denies pain    Home Living Family/patient expects to be discharged to:: Private residence Living Arrangements: Spouse/significant other Available Help at Discharge: Family;Available PRN/intermittently Type of Home: House Home Access: Stairs to enter Entrance Stairs-Rails: None Entrance Stairs-Number of Steps: 2   Home Layout: One level Home Equipment: [employer] (2 wheels);Cane - single point      Prior Function            PT Goals (current goals can now be found in the care plan section) Acute Rehab PT Goals Patient Stated Goal: Regain independence PT Goal Formulation: With patient Time For Goal Achievement: 07/11/24 Potential to Achieve Goals: Fair Progress towards PT goals: Progressing toward goals    Frequency    Min 2X/week      PT Plan      Co-evaluation              AM-PAC PT 6 Clicks Mobility   Outcome Measure  Help needed turning from your back to your side while in a flat bed without using bedrails?: A Little Help needed moving from lying on your back to sitting on the side of a flat bed without using bedrails?: A Little Help needed moving to and from a bed to a chair (including a wheelchair)?: A Little Help needed standing up from a chair using your arms (e.g., wheelchair or bedside chair)?: A Little Help needed to walk in hospital room?: A Little Help needed climbing 3-5 steps with a railing? : Total 6 Click Score: 16    End of Session Equipment Utilized During Treatment: Gait belt Activity Tolerance: Patient tolerated treatment well;Patient limited by fatigue Patient left: in chair;with call bell/phone within reach;with chair alarm set Nurse Communication: Mobility status;Other (comment) (Weaned to RA & SpO2) PT Visit Diagnosis: Other abnormalities of gait and mobility (R26.89);Muscle weakness (generalized) (M62.81)     Time: 1206-1224 PT Time  Calculation (min) (ACUTE ONLY): 18 min  Charges:    $Gait Training: 8-22 mins PT General Charges $$ ACUTE PT VISIT: 1 Visit                     Randall SAUNDERS, PT, DPT Acute Rehabilitation Services Office: [phone] Secure Chat Preferred  Franklin Ward 06/28/2024, 1:51 PM

## 2024-06-28 NOTE — Care Management Important Message (Signed)
 Important Message  Patient Details  Name: SAHARSH STERLING MRN: [id number] Date of Birth: 1945-09-06   Important Message Given:  Yes - Medicare IM     Jennie Laneta Dragon 06/28/2024, 12:11 PM

## 2024-06-28 NOTE — Progress Notes (Signed)
 SLP Cancellation Note  Patient Details Name: Franklin Ward MRN: [id number] DOB: 1946/04/29   Cancelled treatment:       Reason Eval/Treat Not Completed: Medical issues which prohibited therapy. Pt currently NPO possible SBO. NGT in place per RN. Will continue efforts.  Jackilyn Umphlett B. Dory, [facility], CCC-SLP Speech Language Pathologist Office: [phone]  Dory Caprice Daring 06/28/2024, 9:00 AM

## 2024-06-28 NOTE — Evaluation (Signed)
 Occupational Therapy Evaluation Patient Details Name: Franklin Ward MRN: [id number] DOB: August 23, 1945 Today's Date: 06/28/2024   History of Present Illness   79 y.o. male presents to [facility] on 06/22/2024 with nausea, vomiting and abdominal pain. Imaging notable for SBO. PMH includes HTN, HLD, CAD, PVCs, diverticulosis and abscess.     Clinical Impressions Patient admitted for the diagnosis above.  PTA he lives at home, and cares for his spouse who has dementia.  Daughter is currently caring for her.  Patient presents with the deficits listed below, needing Mod A for lower body ADL and Min to Mod A for simple transfers with cane.  OT will continue efforts in the acute setting to address deficits, and Patient will benefit from intensive inpatient follow-up therapy, >3 hours/day     If plan is discharge home, recommend the following:   A lot of help with bathing/dressing/bathroom;A lot of help with walking and/or transfers     Functional Status Assessment   Patient has had a recent decline in their functional status and demonstrates the ability to make significant improvements in function in a reasonable and predictable amount of time.     Equipment Recommendations   None recommended by OT     Recommendations for Other Services         Precautions/Restrictions   Precautions Precautions: Fall Recall of Precautions/Restrictions: Intact Restrictions Weight Bearing Restrictions Per Provider Order: No     Mobility Bed Mobility Overal bed mobility: Needs Assistance Bed Mobility: Supine to Sit     Supine to sit: Min assist          Transfers Overall transfer level: Needs assistance Equipment used: Rolling walker (2 wheels) Transfers: Sit to/from Stand, Bed to chair/wheelchair/BSC Sit to Stand: Min assist, From elevated surface     Step pivot transfers: Min assist            Balance Overall balance assessment: Needs assistance Sitting-balance  support: No upper extremity supported, Feet supported Sitting balance-Leahy Scale: Fair     Standing balance support: Reliant on assistive device for balance Standing balance-Leahy Scale: Poor                             ADL either performed or assessed with clinical judgement   ADL Overall ADL's : Needs assistance/impaired Eating/Feeding: NPO   Grooming: Wash/dry hands;Wash/dry face;Set up;Sitting   Upper Body Bathing: Set up;Sitting   Lower Body Bathing: Moderate assistance;Sit to/from stand   Upper Body Dressing : Set up;Sitting   Lower Body Dressing: Moderate assistance;Sit to/from stand   Toilet Transfer: Minimal assistance;BSC/3in1;Stand-pivot                   Vision Baseline Vision/History: 1 Wears glasses Patient Visual Report: No change from baseline       Perception Perception: Not tested       Praxis Praxis: Not tested       Pertinent Vitals/Pain Pain Assessment Pain Assessment: Faces Faces Pain Scale: No hurt Pain Intervention(s): Monitored during session     Extremity/Trunk Assessment Upper Extremity Assessment Upper Extremity Assessment: Overall WFL for tasks assessed   Lower Extremity Assessment Lower Extremity Assessment: Defer to PT evaluation   Cervical / Trunk Assessment Cervical / Trunk Assessment: Kyphotic   Communication Communication Communication: Impaired Factors Affecting Communication: Difficulty expressing self   Cognition Arousal: Alert Behavior During Therapy: WFL for tasks assessed/performed Cognition: No apparent impairments  Following commands: Intact       Cueing  General Comments   Cueing Techniques: Verbal cues   VSS on O2   Exercises     Shoulder Instructions      Home Living Family/patient expects to be discharged to:: Private residence Living Arrangements: Spouse/significant other Available Help at Discharge: Family;Available  PRN/intermittently Type of Home: House Home Access: Stairs to enter [employer] of Steps: 2 Entrance Stairs-Rails: None Home Layout: One level     Bathroom Shower/Tub: Tub/shower unit;Walk-in shower   Bathroom Toilet: Standard     Home Equipment: [employer]ant (2 wheels);Cane - single point          Prior Functioning/Environment Prior Level of Function : Independent/Modified Independent             Mobility Comments: ambulatory with PRN use of SPC ADLs Comments: completed ADL, iADL and meal prep.  Cares for his spouse who has significant dementia.  Sounds like bedbound with transfers only.    OT Problem List: Decreased strength;Decreased activity tolerance;Impaired balance (sitting and/or standing);Decreased safety awareness   OT Treatment/Interventions: Self-care/ADL training;Patient/family education;Therapeutic activities;Balance training      OT Goals(Current goals can be found in the care plan section)   Acute Rehab OT Goals Patient Stated Goal: Return home OT Goal Formulation: With patient Time For Goal Achievement: 07/12/24 Potential to Achieve Goals: Good ADL Goals Pt Will Perform Grooming: with modified independence;standing Pt Will Perform Lower Body Dressing: with modified independence;sit to/from stand Pt Will Transfer to Toilet: with modified independence;ambulating;regular height toilet   OT Frequency:  Min 2X/week    Co-evaluation              AM-PAC OT 6 Clicks Daily Activity     Outcome Measure Help from another person eating meals?: None Help from another person taking care of personal grooming?: None Help from another person toileting, which includes using toliet, bedpan, or urinal?: A Lot Help from another person bathing (including washing, rinsing, drying)?: A Lot Help from another person to put on and taking off regular upper body clothing?: A Little Help from another person to put on and taking off regular lower body  clothing?: A Lot 6 Click Score: 17   End of Session Equipment Utilized During Treatment: Other (comment);Oxygen Nurse Communication: Mobility status  Activity Tolerance: Patient tolerated treatment well Patient left: in chair;with call bell/phone within reach;with chair alarm set  OT Visit Diagnosis: Unsteadiness on feet (R26.81);Muscle weakness (generalized) (M62.81)                Time: 9044-8986 OT Time Calculation (min): 18 min Charges:  OT General Charges $OT Visit: 1 Visit OT Evaluation $OT Eval Moderate Complexity: 1 Mod  06/28/2024  RP, OTR/L  Acute Rehabilitation Services  Office:  [phone]   Charlie JONETTA Halsted 06/28/2024, 10:19 AM

## 2024-06-29 ENCOUNTER — Encounter (HOSPITAL_COMMUNITY)
Admission: EM | Disposition: A | Discharge status: Home Health | Payer: Self-pay | Source: Home / Self Care | Attending: Family Medicine

## 2024-06-29 ENCOUNTER — Inpatient Hospital Stay (HOSPITAL_COMMUNITY): Admitting: Certified Registered Nurse Anesthetist

## 2024-06-29 ENCOUNTER — Encounter (HOSPITAL_COMMUNITY): Payer: Self-pay | Admitting: Internal Medicine

## 2024-06-29 DIAGNOSIS — K565 Intestinal adhesions [bands], unspecified as to partial versus complete obstruction: Secondary | ICD-10-CM | POA: Diagnosis not present

## 2024-06-29 DIAGNOSIS — I10 Essential (primary) hypertension: Secondary | ICD-10-CM

## 2024-06-29 DIAGNOSIS — I4891 Unspecified atrial fibrillation: Secondary | ICD-10-CM | POA: Diagnosis not present

## 2024-06-29 DIAGNOSIS — I2510 Atherosclerotic heart disease of native coronary artery without angina pectoris: Secondary | ICD-10-CM

## 2024-06-29 DIAGNOSIS — Z87891 Personal history of nicotine dependence: Secondary | ICD-10-CM | POA: Diagnosis not present

## 2024-06-29 HISTORY — PX: LAPAROSCOPIC LYSIS OF ADHESIONS: SHX5905

## 2024-06-29 HISTORY — PX: LAPAROSCOPY: SHX197

## 2024-06-29 LAB — BASIC METABOLIC PANEL WITH GFR
Anion gap: 19 — ABNORMAL HIGH (ref 5–15)
BUN: 44 mg/dL — ABNORMAL HIGH (ref 8–23)
CO2: 12 mmol/L — ABNORMAL LOW (ref 22–32)
Calcium: 8.6 mg/dL — ABNORMAL LOW (ref 8.9–10.3)
Chloride: 108 mmol/L (ref 98–111)
Creatinine, Ser: 1.12 mg/dL (ref 0.61–1.24)
GFR, Estimated: >60 mL/min
Glucose, Bld: 86 mg/dL (ref 70–99)
Potassium: 5 mmol/L (ref 3.5–5.1)
Sodium: 139 mmol/L (ref 135–145)

## 2024-06-29 LAB — CBC WITH DIFFERENTIAL/PLATELET
Abs Immature Granulocytes: 0.16 K/uL — ABNORMAL HIGH (ref 0.00–0.07)
Basophils Absolute: 0 K/uL (ref 0.0–0.1)
Basophils Relative: 0 %
Eosinophils Absolute: 0 K/uL (ref 0.0–0.5)
Eosinophils Relative: 0 %
HCT: 47.8 % (ref 39.0–52.0)
Hemoglobin: 15.8 g/dL (ref 13.0–17.0)
Immature Granulocytes: 2 %
Lymphocytes Relative: 6 %
Lymphs Abs: 0.7 K/uL (ref 0.7–4.0)
MCH: 31.3 pg (ref 26.0–34.0)
MCHC: 33.1 g/dL (ref 30.0–36.0)
MCV: 94.8 fL (ref 80.0–100.0)
Monocytes Absolute: 0.4 K/uL (ref 0.1–1.0)
Monocytes Relative: 4 %
Neutro Abs: 9.7 K/uL — ABNORMAL HIGH (ref 1.7–7.7)
Neutrophils Relative %: 88 %
Platelets: 160 K/uL (ref 150–400)
RBC: 5.04 MIL/uL (ref 4.22–5.81)
RDW: 14 % (ref 11.5–15.5)
WBC: 11 K/uL — ABNORMAL HIGH (ref 4.0–10.5)
nRBC: 0 % (ref 0.0–0.2)

## 2024-06-29 LAB — TYPE AND SCREEN
ABO/RH(D): A POS
Antibody Screen: NEGATIVE

## 2024-06-29 MED ORDER — ORAL CARE MOUTH RINSE: 15.0000 mL | Freq: Once | OROMUCOSAL | Status: AC

## 2024-06-29 MED ORDER — DEXAMETHASONE SOD PHOSPHATE PF 10 MG/ML IJ SOLN
INTRAMUSCULAR | Status: DC | PRN
  Administered 2024-06-29: 10 mg via INTRAVENOUS

## 2024-06-29 MED ORDER — LACTATED RINGERS IV SOLN: INTRAVENOUS | Status: DC

## 2024-06-29 MED ORDER — SUCCINYLCHOLINE CHLORIDE 200 MG/10ML IV SOSY
PREFILLED_SYRINGE | INTRAVENOUS | Status: DC | PRN
  Administered 2024-06-29: 140 mg via INTRAVENOUS

## 2024-06-29 MED ORDER — LIDOCAINE 2% (20 MG/ML) 5 ML SYRINGE
INTRAMUSCULAR | Status: DC | PRN
  Administered 2024-06-29: 20 mg via INTRAVENOUS

## 2024-06-29 MED ORDER — CHLORHEXIDINE GLUCONATE 0.12 % MT SOLN
OROMUCOSAL | Status: AC
  Administered 2024-06-29: 15 mL via OROMUCOSAL
  Filled 2024-06-29: qty 15

## 2024-06-29 MED ORDER — ONDANSETRON HCL 4 MG/2ML IJ SOLN
INTRAMUSCULAR | Status: AC
  Filled 2024-06-29: qty 2

## 2024-06-29 MED ORDER — MIDAZOLAM HCL (PF) 2 MG/2ML IJ SOLN: 0.5000 mg | Freq: Once | INTRAMUSCULAR | Status: DC | PRN

## 2024-06-29 MED ORDER — ONDANSETRON HCL 4 MG/2ML IJ SOLN
INTRAMUSCULAR | Status: DC | PRN
  Administered 2024-06-29: 4 mg via INTRAVENOUS

## 2024-06-29 MED ORDER — PROPOFOL 10 MG/ML IV BOLUS
INTRAVENOUS | Status: DC | PRN
  Administered 2024-06-29: 100 mg via INTRAVENOUS

## 2024-06-29 MED ORDER — BUPIVACAINE-EPINEPHRINE (PF) 0.25% -1:200000 IJ SOLN
INTRAMUSCULAR | Status: DC | PRN
  Administered 2024-06-29: 6 mL

## 2024-06-29 MED ORDER — DEXAMETHASONE SOD PHOSPHATE PF 10 MG/ML IJ SOLN
INTRAMUSCULAR | Status: AC
  Filled 2024-06-29: qty 1

## 2024-06-29 MED ORDER — PHENYLEPHRINE HCL-NACL 20-0.9 MG/250ML-% IV SOLN
INTRAVENOUS | Status: DC | PRN
  Administered 2024-06-29: 50 ug/min via INTRAVENOUS

## 2024-06-29 MED ORDER — SUCCINYLCHOLINE CHLORIDE 200 MG/10ML IV SOSY
PREFILLED_SYRINGE | INTRAVENOUS | Status: AC
  Filled 2024-06-29: qty 10

## 2024-06-29 MED ORDER — 0.9 % SODIUM CHLORIDE (POUR BTL) OPTIME
TOPICAL | Status: DC | PRN
  Administered 2024-06-29: 1000 mL

## 2024-06-29 MED ORDER — ALBUMIN HUMAN 5 % IV SOLN: INTRAVENOUS | Status: DC | PRN

## 2024-06-29 MED ORDER — ROCURONIUM BROMIDE 10 MG/ML (PF) SYRINGE
PREFILLED_SYRINGE | INTRAVENOUS | Status: AC
  Filled 2024-06-29: qty 10

## 2024-06-29 MED ORDER — BUPIVACAINE-EPINEPHRINE (PF) 0.25% -1:200000 IJ SOLN
INTRAMUSCULAR | Status: AC
  Filled 2024-06-29: qty 30

## 2024-06-29 MED ORDER — OXYCODONE HCL 5 MG/5ML PO SOLN: 5.0000 mg | Freq: Once | ORAL | Status: DC | PRN

## 2024-06-29 MED ORDER — HYDROMORPHONE HCL 1 MG/ML IJ SOLN: 1.0000 mg | INTRAMUSCULAR | Status: DC | PRN

## 2024-06-29 MED ORDER — FENTANYL CITRATE (PF) 250 MCG/5ML IJ SOLN
INTRAMUSCULAR | Status: AC
  Filled 2024-06-29: qty 5

## 2024-06-29 MED ORDER — LIDOCAINE 2% (20 MG/ML) 5 ML SYRINGE
INTRAMUSCULAR | Status: AC
  Filled 2024-06-29: qty 5

## 2024-06-29 MED ORDER — SUGAMMADEX SODIUM 200 MG/2ML IV SOLN
INTRAVENOUS | Status: DC | PRN
  Administered 2024-06-29: 200 mg via INTRAVENOUS

## 2024-06-29 MED ORDER — PHENYLEPHRINE 80 MCG/ML (10ML) SYRINGE FOR IV PUSH (FOR BLOOD PRESSURE SUPPORT)
PREFILLED_SYRINGE | INTRAVENOUS | Status: AC
  Filled 2024-06-29: qty 10

## 2024-06-29 MED ORDER — CHLORHEXIDINE GLUCONATE 0.12 % MT SOLN: 15.0000 mL | Freq: Once | OROMUCOSAL | Status: AC

## 2024-06-29 MED ORDER — ROCURONIUM BROMIDE 10 MG/ML (PF) SYRINGE
PREFILLED_SYRINGE | INTRAVENOUS | Status: DC | PRN
  Administered 2024-06-29: 20 mg via INTRAVENOUS
  Administered 2024-06-29: 50 mg via INTRAVENOUS

## 2024-06-29 MED ORDER — FENTANYL CITRATE (PF) 250 MCG/5ML IJ SOLN
INTRAMUSCULAR | Status: DC | PRN
  Administered 2024-06-29 (×2): 50 ug via INTRAVENOUS
  Administered 2024-06-29: 100 ug via INTRAVENOUS

## 2024-06-29 MED ORDER — PHENYLEPHRINE 80 MCG/ML (10ML) SYRINGE FOR IV PUSH (FOR BLOOD PRESSURE SUPPORT)
PREFILLED_SYRINGE | INTRAVENOUS | Status: DC | PRN
  Administered 2024-06-29: 240 ug via INTRAVENOUS
  Administered 2024-06-29: 160 ug via INTRAVENOUS

## 2024-06-29 MED ORDER — OXYCODONE HCL 5 MG PO TABS: 5.0000 mg | ORAL_TABLET | Freq: Once | ORAL | Status: DC | PRN

## 2024-06-29 MED ORDER — FENTANYL CITRATE (PF) 100 MCG/2ML IJ SOLN: 25.0000 ug | INTRAMUSCULAR | Status: DC | PRN

## 2024-06-29 MED ORDER — METOPROLOL TARTRATE 50 MG PO TABS
50.0000 mg | ORAL_TABLET | Freq: Two times a day (BID) | ORAL | Status: DC
  Administered 2024-06-29 – 2024-06-30 (×2): 50 mg
  Filled 2024-06-29 (×2): qty 1

## 2024-06-29 NOTE — Progress Notes (Signed)
 " PROGRESS NOTE    Franklin Ward  FMW:[id number] DOB: 1946/06/09 DOA: 06/22/2024 PCP: Arloa Elsie JONELLE, MD   Brief Narrative:  Franklin Ward is a 79 y.o. male with medical history significant of hypertension, hyperlipidemia, CAD status post stent, PVCs, diverticulosis with history of diverticulitis and abscess presented with nausea, vomiting, abdominal pain for the last 3 days.  Denied any fever or chills, chest pain or shortness of breath.   Hemodynamically stable upon arrival to ED. CT abdomen pelvis showed severe proximal small bowel dilation with transition zone consistent with SBO.  Diverticulosis without diverticulitis.  General surgery consulted.   Assessment & Plan:   Principal Problem:   SBO (small bowel obstruction) (HCC) Active Problems:   Benign essential HTN   PVC's (premature ventricular contractions)   Coronary arteriosclerosis   HLD (hyperlipidemia)  SBO Confirmed with abdominal series.  24-hour delay film worse today with clinically increased abdominal distention and still no bowel function.  Due to not passing flatus and increased abdominal distention, general surgery.  SBO protocol 06/24/2024 which again showed obstruction however patient started feeling better yesterday.  Patient had bowel movement on the night of 06/25/2024, NG tube clamped 06/27/2023, started on clear liquid diet, which was eventually advanced to full liquid diet, patient had bowel movement 2 days back-to-back however on the late afternoon of 06/27/2024, patient was noted to have aspiration and becoming hypoxic with worsened abdominal distention so NG tube was reinserted.  General surgery has reassessed him today, patient has been passing flatus, x-ray shows that the contrast is in the colon, NGT was once again removed, he was started on clear liquid diet, patient then developed distention, nausea vomiting, NG tube needed to be placed back yesterday.  He is not passing flatus this morning.  Surgery has  decided for surgical intervention now.  Acute hypoxic respiratory failure secondary to aspiration pneumonia, not POA: On late afternoon of 06/27/2024, while working with PT patient was noted to have possible aspiration, became hypoxic with oxygen saturation of 85% requiring 5 L of oxygen.  X-ray was obtained which shows bibasilar airspace opacities which could be pneumonia or atelectasis.  CBC also showed leukocytosis.  Patient was kept n.p.o. briefly, started on Unasyn  for concern of aspiration pneumonia.  Patient will need to be seen by SLP before starting on a diet.   Hypertension Continue home dose of metoprolol  as well as needed IV hydralazine .  Blood pressure controlled.   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 - Continue metoprolol  and amiodarone .    DVT prophylaxis: SCDs Start: 06/22/24 1406   Code Status: Full Code  Family Communication:  None present at bedside.  Plan of care discussed with patient in length and he/she verbalized understanding and agreed with it.  Status is: Inpatient Remains inpatient appropriate because: Still with SBO.  Surgery today   Estimated body mass index is 26.08 kg/m as calculated from the following:   Height as of this encounter: 5' 8 (1.727 m).   Weight as of this encounter: 77.8 kg.    Nutritional Assessment: Body mass index is 26.08 kg/m.SABRA Seen by dietician.  I agree with the assessment and plan as outlined below: Nutrition Status:        . Skin Assessment: I have examined the patient's skin and I agree with the wound assessment  as performed by the wound care RN as outlined below:    Consultants:  General surgery  Procedures:  As above  Antimicrobials:  Anti-infectives (From admission, onward)    Start     Dose/Rate Route Frequency Ordered Stop    06/28/24 1200  [MAR Hold]  Ampicillin -Sulbactam (UNASYN ) 3 g in sodium chloride  0.9 % 100 mL IVPB        (MAR Hold since Fri 06/29/2024 at 0843.Hold Reason: Transfer to a Procedural area)   3 g 200 mL/hr over 30 Minutes Intravenous Every 6 hours 06/28/24 1150           Subjective: Patient seen and examined earlier.  Complains of abdominal distention, some nausea and not passing flatus.  Objective: Vitals:   06/29/24 1100 06/29/24 1115 06/29/24 1130 06/29/24 1159  BP: (!) 174/72 (!) 152/74 (!) 156/80 (!) 140/67  Pulse: 87 89 79 (!) 101  Resp: 16 17 19    Temp:   97.7 F (36.5 C) 97.8 F (36.6 C)  TempSrc:      SpO2: 92% 93% 94% 94%  Weight:      Height:        Intake/Output Summary (Last 24 hours) at 06/29/2024 1223 Last data filed at 06/29/2024 1130 Gross per 24 hour  Intake 1370 ml  Output 1565 ml  Net -195 ml   Filed Weights   06/22/24 0855  Weight: 77.8 kg    Examination:  General exam: Appears calm and comfortable  Respiratory system: Clear to auscultation. Respiratory effort normal. Cardiovascular system: S1 & S2 heard, RRR. No JVD, murmurs, rubs, gallops or clicks. No pedal edema. Gastrointestinal system: Abdomen is slightly distended, soft and nontender.  No organomegaly or masses felt.  Slow bowel sounds. Central nervous system: Alert and oriented. No focal neurological deficits. Extremities: Symmetric 5 x 5 power. Skin: No rashes, lesions or ulcers.   Data Reviewed: I have personally reviewed following labs and imaging studies  CBC: Recent Labs  Lab 06/23/24 0846 06/28/24 1052  WBC 6.7 10.9*  NEUTROABS  --  8.3*  HGB 16.1 16.2  HCT 46.9 49.9  MCV 92.7 94.5  PLT 223 246   Basic Metabolic Panel: Recent Labs  Lab 06/23/24 0846 06/24/24 0626 06/26/24 0906 06/28/24 1330  NA 138 140 144 140  K 4.4 4.7 3.8 4.6  CL 100 102 111 108  CO2 25 22 20* 20*  GLUCOSE 75 74 81 104*  BUN 46* 43* 32* 45*  CREATININE 1.09 0.98 0.73 1.37*  CALCIUM  9.3 8.8* 8.1*  8.9  MG  --  2.3  --   --    GFR: Estimated Creatinine Clearance: 42.3 mL/min (A) (by C-G formula based on SCr of 1.37 mg/dL (H)). Liver Function Tests: Recent Labs  Lab 06/23/24 0846  AST 41  ALT 23  ALKPHOS 53  BILITOT 0.8  PROT 6.9  ALBUMIN  3.8   No results for input(s): LIPASE, AMYLASE in the last 168 hours.  No results for input(s): AMMONIA in the last 168 hours. Coagulation Profile: No results for input(s): INR, PROTIME in the last 168 hours. Cardiac Enzymes: No results for input(s): CKTOTAL, CKMB, CKMBINDEX, TROPONINI in the last 168 hours. BNP (last 3 results) No results for input(s): PROBNP in the last 8760 hours. HbA1C: No results for input(s): HGBA1C in the last 72 hours. CBG: No results for input(s): GLUCAP in the last 168 hours. Lipid Profile: No results for input(s): CHOL, HDL, LDLCALC, TRIG, CHOLHDL, LDLDIRECT in the last 72 hours. Thyroid  Function  Tests: No results for input(s): TSH, T4TOTAL, FREET4, T3FREE, THYROIDAB in the last 72 hours. Anemia Panel: No results for input(s): VITAMINB12, FOLATE, FERRITIN, TIBC, IRON, RETICCTPCT in the last 72 hours. Sepsis Labs: No results for input(s): PROCALCITON, LATICACIDVEN in the last 168 hours.  No results found for this or any previous visit (from the past 240 hours).   Radiology Studies: DG Abd 1 View Result Date: 06/28/2024 EXAM: 1 VIEW XRAY OF THE ABDOMEN 06/28/2024 07:25:00 PM COMPARISON: 06/28/2024 CLINICAL HISTORY: Encounter for nasogastric (NG) tube placement FINDINGS: LINES, TUBES AND DEVICES: NG tube in place with tip overlying the upper stomach. Side port of NG tube 2.5 cm distal to expected location of GE junction. BOWEL: Dilated small bowel loops again noted. SOFT TISSUES: No abnormal calcifications. BONES: No acute fracture. PLEURAL SPACES: Small bilateral pleural effusions and bibasilar atelectasis. IMPRESSION: 1. NG tube in place with tip  overlying the upper stomach and side port 2.5 cm distal to the expected location of the GE junction. 2. Dilated small bowel loops. 3. Small bilateral pleural effusions and bibasilar atelectasis. Electronically signed by: Elsie Gravely MD 06/28/2024 07:31 PM EST RP Workstation: [id number]   DG Abd Portable 1V Result Date: 06/28/2024 CLINICAL DATA:  Small bowel obstruction. EXAM: PORTABLE ABDOMEN - 1 VIEW COMPARISON:  06/27/2024 FINDINGS: Persistent dilated loops of small bowel. The degree of small bowel distension has minimally changed. Again noted is oral contrast within the sigmoid colon, right colon and transverse colon. Nasogastric tube tip is near the gastric fundus region. Stomach appears to be decompressed. Patchy densities at the lung bases could represent atelectasis. Cannot exclude small pleural effusions. IMPRESSION: 1. Persistent dilated loops of small bowel. Findings are compatible with a small bowel obstruction. Small bowel distension has not significantly changed. 2. Nasogastric tube tip is near the gastric fundus region. Electronically Signed   By: Juliene Balder M.D.   On: 06/28/2024 11:54   DG Abd 1 View Result Date: 06/27/2024 EXAM: 1 VIEW XRAY OF THE ABDOMEN 06/27/2024 06:45:00 PM COMPARISON: 06/27/2024 CLINICAL HISTORY: Encounter for nasogastric (NG) tube placement FINDINGS: LINES, TUBES AND DEVICES: Enteric tube in place with tip and side port overlying the expected region of the gastric lumen. BOWEL: Multiple dilated loops of small bowel in the visualized upper abdomen consistent with possible small-bowel obstruction. SOFT TISSUES: No abnormal calcifications. BONES: No acute fracture. LUNGS AND PLEURAL SPACES: Trace left pleural effusion. Bibasilar atelectasis. IMPRESSION: 1. Enteric tube in place with tip and side port overlying the expected region of the gastric lumen. 2. Possible small-bowel obstruction. 3. Trace left pleural effusion with bibasilar atelectasis. Electronically signed by:  Greig Pique MD MD 06/27/2024 08:11 PM EST RP Workstation: [id number]   DG CHEST PORT 1 VIEW Result Date: 06/27/2024 EXAM: 1 VIEW(S) XRAY OF THE CHEST 06/27/2024 06:45:00 PM COMPARISON: 03/22/2024 CLINICAL HISTORY: 200810 Hypoxic 200810 FINDINGS: LINES, TUBES AND DEVICES: Enteric tube in place with distal tip beyond the inferior margin of the film. LUNGS AND PLEURA: Low lung volumes. Bibasilar airspace opacities, could represent atelectasis or pneumonia. No pleural effusion. No pneumothorax. HEART AND MEDIASTINUM: Aortic atherosclerosis. No acute abnormality of the cardiac and mediastinal silhouettes. BONES AND SOFT TISSUES: No acute osseous abnormality. IMPRESSION: 1. Bibasilar airspace opacities, favored atelectasis though pneumonia is not excluded. 2. Enteric tube tip projects beyond the inferior margin of the film. Electronically signed by: Greig Pique MD MD 06/27/2024 08:10 PM EST RP Workstation: [id number]    Scheduled Meds:  [MAR Hold] amiodarone   200 mg Oral Once per  day on Monday Tuesday Wednesday Thursday Friday   [facility] Hold] cyanocobalamin   2,000 mcg Oral Daily   [MAR Hold] ezetimibe   10 mg Oral Daily   [MAR Hold] metoprolol  tartrate  50 mg Oral BID   [MAR Hold] sodium chloride  flush  3 mL Intravenous Q12H   Continuous Infusions:  [MAR Hold] ampicillin -sulbactam (UNASYN ) IV 3 g (06/29/24 0630)   lactated ringers  10 mL/hr at 06/29/24 9072       LOS: 7 days   Fredia Skeeter, MD [facility]  06/29/2024, 12:23 PM   *Please note that this is a verbal dictation therefore any spelling or grammatical errors are due to the Dragon Medical One system interpretation.  Please page via Amion and do not message via secure chat for urgent patient care matters. Secure chat can be used for non urgent patient care matters.  How to contact the [facility] Attending or Consulting provider 7A - 7P or covering provider during after hours 7P -7A, for this patient?  Check the care team in [facility] and look  for a) attending/consulting [facility] provider listed and b) the [facility] team listed. Page or secure chat 7A-7P. Log into [email or web address] and use [place]'s universal password to access. If you do not have the password, please contact the hospital operator. Locate the [facility] provider you are looking for under [facility] and page to a number that you can be directly reached. If you still have difficulty reaching the provider, please page the [facility] (Director on Call) for the Hospitalists listed on amion for assistance.  "

## 2024-06-29 NOTE — Progress Notes (Signed)
 "  Progress Note     Subjective: Patient had to have NGT replaced yesterday. Denies abdominal pain at rest. Has not had a BM. Reports flatulence. Denies n/v currently.   Nursing staff reports that he has only had ice since yesterday at 5:00PM.  ROS  All negative with the exception of above.  Objective: Vital signs in last 24 hours: Temp:  [97.1 F (36.2 C)-98 F (36.7 C)] 98 F (36.7 C) (01/09 0226) Pulse Rate:  [74-96] 96 (01/09 0226) Resp:  [17-19] 17 (01/09 0226) BP: (132-143)/(63-74) 133/63 (01/09 0226) SpO2:  [92 %-96 %] 96 % (01/09 0226) Last BM Date : 06/26/24  Intake/Output from previous day: 01/08 0701 - 01/09 0700 In: 420 [P.O.:120; IV Piggyback:300] Out: 2400 [Emesis/NG output:2400] Intake/Output this shift: No intake/output data recorded.  PE: General: Pleasant male who is laying in bed in NAD. HEENT: Head is normocephalic, atraumatic. NGT in place. Heart: HR normal during encounter.  Lungs: Respiratory effort nonlabored on room air. Abd: Soft with distention. Tenderness to palpation of left upper quadrant and left lower quadrant. No rebound tenderness or guarding.  MS: Able to move all 4 extremities.  Skin: Warm and dry.  Psych: A&Ox3 with an appropriate affect.   Lab Results:  Recent Labs    06/28/24 1052  WBC 10.9*  HGB 16.2  HCT 49.9  PLT 246   BMET Recent Labs    06/26/24 0906 06/28/24 1330  NA 144 140  K 3.8 4.6  CL 111 108  CO2 20* 20*  GLUCOSE 81 104*  BUN 32* 45*  CREATININE 0.73 1.37*  CALCIUM  8.1* 8.9   PT/INR No results for input(s): LABPROT, INR in the last 72 hours. CMP     Component Value Date/Time   NA 140 06/28/2024 1330   NA 137 03/22/2024 0955   K 4.6 06/28/2024 1330   CL 108 06/28/2024 1330   CO2 20 (L) 06/28/2024 1330   GLUCOSE 104 (H) 06/28/2024 1330   BUN 45 (H) 06/28/2024 1330   BUN 16 03/22/2024 0955   CREATININE 1.37 (H) 06/28/2024 1330   CALCIUM  8.9 06/28/2024 1330   PROT 6.9 06/23/2024 0846    PROT 7.2 03/22/2024 0955   ALBUMIN  3.8 06/23/2024 0846   ALBUMIN  4.6 03/22/2024 0955   AST 41 06/23/2024 0846   ALT 23 06/23/2024 0846   ALKPHOS 53 06/23/2024 0846   BILITOT 0.8 06/23/2024 0846   BILITOT 0.3 03/22/2024 0955   GFRNONAA 52 (L) 06/28/2024 1330   GFRAA 104 08/21/2018 1311   Lipase     Component Value Date/Time   LIPASE 24 06/22/2024 0553       Studies/Results: DG Abd 1 View Result Date: 06/28/2024 EXAM: 1 VIEW XRAY OF THE ABDOMEN 06/28/2024 07:25:00 PM COMPARISON: 06/28/2024 CLINICAL HISTORY: Encounter for nasogastric (NG) tube placement FINDINGS: LINES, TUBES AND DEVICES: NG tube in place with tip overlying the upper stomach. Side port of NG tube 2.5 cm distal to expected location of GE junction. BOWEL: Dilated small bowel loops again noted. SOFT TISSUES: No abnormal calcifications. BONES: No acute fracture. PLEURAL SPACES: Small bilateral pleural effusions and bibasilar atelectasis. IMPRESSION: 1. NG tube in place with tip overlying the upper stomach and side port 2.5 cm distal to the expected location of the GE junction. 2. Dilated small bowel loops. 3. Small bilateral pleural effusions and bibasilar atelectasis. Electronically signed by: Elsie Gravely MD 06/28/2024 07:31 PM EST RP Workstation: [id number]   DG Abd Portable 1V Result Date: 06/28/2024 CLINICAL DATA:  Small bowel obstruction. EXAM: PORTABLE ABDOMEN - 1 VIEW COMPARISON:  06/27/2024 FINDINGS: Persistent dilated loops of small bowel. The degree of small bowel distension has minimally changed. Again noted is oral contrast within the sigmoid colon, right colon and transverse colon. Nasogastric tube tip is near the gastric fundus region. Stomach appears to be decompressed. Patchy densities at the lung bases could represent atelectasis. Cannot exclude small pleural effusions. IMPRESSION: 1. Persistent dilated loops of small bowel. Findings are compatible with a small bowel obstruction. Small bowel distension has not  significantly changed. 2. Nasogastric tube tip is near the gastric fundus region. Electronically Signed   By: Juliene Balder M.D.   On: 06/28/2024 11:54   DG Abd 1 View Result Date: 06/27/2024 EXAM: 1 VIEW XRAY OF THE ABDOMEN 06/27/2024 06:45:00 PM COMPARISON: 06/27/2024 CLINICAL HISTORY: Encounter for nasogastric (NG) tube placement FINDINGS: LINES, TUBES AND DEVICES: Enteric tube in place with tip and side port overlying the expected region of the gastric lumen. BOWEL: Multiple dilated loops of small bowel in the visualized upper abdomen consistent with possible small-bowel obstruction. SOFT TISSUES: No abnormal calcifications. BONES: No acute fracture. LUNGS AND PLEURAL SPACES: Trace left pleural effusion. Bibasilar atelectasis. IMPRESSION: 1. Enteric tube in place with tip and side port overlying the expected region of the gastric lumen. 2. Possible small-bowel obstruction. 3. Trace left pleural effusion with bibasilar atelectasis. Electronically signed by: Greig Pique MD MD 06/27/2024 08:11 PM EST RP Workstation: [id number]   DG CHEST PORT 1 VIEW Result Date: 06/27/2024 EXAM: 1 VIEW(S) XRAY OF THE CHEST 06/27/2024 06:45:00 PM COMPARISON: 03/22/2024 CLINICAL HISTORY: 200810 Hypoxic 200810 FINDINGS: LINES, TUBES AND DEVICES: Enteric tube in place with distal tip beyond the inferior margin of the film. LUNGS AND PLEURA: Low lung volumes. Bibasilar airspace opacities, could represent atelectasis or pneumonia. No pleural effusion. No pneumothorax. HEART AND MEDIASTINUM: Aortic atherosclerosis. No acute abnormality of the cardiac and mediastinal silhouettes. BONES AND SOFT TISSUES: No acute osseous abnormality. IMPRESSION: 1. Bibasilar airspace opacities, favored atelectasis though pneumonia is not excluded. 2. Enteric tube tip projects beyond the inferior margin of the film. Electronically signed by: Greig Pique MD MD 06/27/2024 08:10 PM EST RP Workstation: [id number]    Anti-infectives: Anti-infectives (From  admission, onward)    Start     Dose/Rate Route Frequency Ordered Stop   06/28/24 1200  Ampicillin -Sulbactam (UNASYN ) 3 g in sodium chloride  0.9 % 100 mL IVPB        3 g 200 mL/hr over 30 Minutes Intravenous Every 6 hours 06/28/24 1150          Assessment/Plan SBO -No previous abdominal surgery -Underwent SBO protocol x2. Xray from 1/8 showed contrast in colon however patient still having symptoms.  -NGT replaced yesterday 1/8 after unsuccessful trial of full liquids without NGT. 800 mL recorded in output after NGT replaced. -Currently without n/v. Having some distention and reports no pain at rest but is tender of left abdomen on exam. Has not had BM. Having flatulence.  -Afebrile. -Recommend NPO and maintaining NGT currently. -Pt has undergone small bowel protocol twice and multiple clamp trials with liquids without success. It was discussed that he would benefit from an exploratory laparoscopy in the OR to further evaluate. This was discussed yesterday 1/8 and again reviewed this AM. I also discussed this in great detail with his daughter Emelio Schneller over the phone. Risks and benefits of procedure were reviewed. He expressed understanding and agrees to proceed. Daughter also expressed understanding and agrees. Will plan for  exploratory laparoscopy this AM.  -Will continue to follow.    FEN - NPO/NGT, IVFs per primary  VTE - Hold plavix , chemical prophylaxis ok from our standpoint ID - Unasyn  for aspiration pneumonia   CAD H/o NSTEMI diverticulosis     LOS: 7 days   I reviewed specialist notes, hospitalist notes, nursing notes, last 24 h vitals and pain scores, last 48 h intake and output, last 24 h labs and trends, and last 24 h imaging results.  This care required moderate level of medical decision making.   Marjorie Carlyon Favre, [facility] Surgery 06/29/2024, 7:33 AM Please see Amion for pager number during day hours 7:00am-4:30pm  "

## 2024-06-29 NOTE — Progress Notes (Signed)
 PT Cancellation Note  Patient Details Name: Franklin Ward MRN: [id number] DOB: [birth year]   Cancelled Treatment:    Reason Eval/Treat Not Completed: Patient at procedure or test/unavailable. Pt heading off unit for surgery upon PT arrival. Pt will follow up as time allows.   Bernardino JINNY Ruth 06/29/2024, 8:43 AM

## 2024-06-29 NOTE — Progress Notes (Signed)
 Jolynn Pack [phone] [facility] Liaison note:    This is a pending outpatient based Palliative care patient with [facility].    Hospital liaison will continue to follow for discharge disposition.?    Please call for any outpatient based palliative care related questions or concerns.    Thank you,    Eleanor Nail, LPN [facility] Liaison [phone]

## 2024-06-29 NOTE — Op Note (Signed)
 " 06/29/2024  8:37 AM  10:28 AM  PATIENT:  Franklin Ward  79 y.o. male  PRE-OPERATIVE DIAGNOSIS:  Small Bowel obstruction  POST-OPERATIVE DIAGNOSIS:  Small Bowel obstruction, adhesions  PROCEDURE:  Procedures: LAPAROSCOPY, DIAGNOSTIC [place] of adhesion  SURGEON:  Surgeons and Role:    DEWAINE Rubin Calamity, MD - Primary  ASSISTANTS: Waddell Collier, RNFA  ANESTHESIA:   local and general  EBL:  minimal   BLOOD ADMINISTERED:none  DRAINS: none   LOCAL MEDICATIONS USED:  MARCAINE      SPECIMEN:  No Specimen  DISPOSITION OF SPECIMEN:  N/A  COUNTS:  YES  TOURNIQUET:  * No tourniquets in log *  DICTATION: .Dragon Dictation  Indicated procedure: Patient is a 79 year old male, with a history of SBO that was not resolving with nonoperative treatment.  Patient had  repeat x-rays which showed dilation of small bowel.  NG tube was removed which resulted in nausea vomiting.  This was replaced and after discussion patient was taken back to the operating for diagnostic laparoscopy.  Findings: Patient with treated omental adhesions to the anterior abdominal wall.  This was lysed x 1.  This appeared to be an area of the bowel that appeared to be transitioning.  The small bowel was ran from the ligament of Treitz distally.  There is no further kinking aside from the distal ileum.  This was adherent to the right pelvic wall.  This was lysed and straightened out.  There was no further areas of transition.  There was some decompressed distal bowel past the area of concern.  Upon completion of the case there is small bowel that was free from all adhesions without any kinks.  The NG tube was confirmed in the gastrum.  Details procedure: After the patient was consented he was taken back to the OR and placed in supine position with bilateral SCDs in place.  He underwent general endotracheal anesthesia.  He was then prepped and draped in standard fashion.  A timeout is called and all facts verified.  A  Veress needle technique was used to insufflate the abdomen to the left subcostal margin.  Subsequent this a 5 mm trocar placed in the left lower quadrant.  The Veress needle was visualized.  There was no injury to intra-abdominal organs.  At this time this was replaced with a 5 mm trocar.  A subsequent 5 mm trocar was placed in the left umbilical midline.  This was done direct visualization.  At this time the patient position.  There was stringy omental adhesions to the intra-abdominal wall.  This was cauterized and lysed.  There appeared to be an area of small bowel that was overlying this area and likely the area of transition.  At this time I ran the small bowel from the ligament of Treitz distally.  There are some dilated loops of bowel proximally and distal collapsed loops of bowel.  There is no further adhesions or transition points that could be seen.  There is an area of the terminal ileum that was visualized to be adherent to the right lower quadrant pelvic wall.  This was lysed.  To help free up the small bowel.  I then visualized the entire bowel.  The NG tube was confirmed in the stomach.  There is no further transition points or areas of adhesion.  At this time insufflation was evacuated.  All trocars were removed.  Trocar sites were reapproximated using 4-0 Monocryl in subcuticular fashion.  Patient tolerated the procedure well was  taken to the recovery in stable condition.   PLAN OF CARE: Admit to inpatient   PATIENT DISPOSITION:  PACU - hemodynamically stable.   Delay start of Pharmacological VTE agent (>24hrs) due to surgical blood loss or risk of bleeding: no  "

## 2024-06-29 NOTE — Plan of Care (Signed)
   Problem: Education: Goal: Knowledge of General Education information will improve Description: Including pain rating scale, medication(s)/side effects and non-pharmacologic comfort measures Outcome: Progressing   Problem: Clinical Measurements: Goal: Diagnostic test results will improve Outcome: Progressing   Problem: Nutrition: Goal: Adequate nutrition will be maintained Outcome: Progressing

## 2024-06-29 NOTE — Anesthesia Postprocedure Evaluation (Signed)
"   Anesthesia Post Note  Patient: Vinh R Rauen  Procedure(s) Performed: LAPAROSCOPY, DIAGNOSTIC (Abdomen) LYSIS, ADHESIONS, LAPAROSCOPIC (Abdomen)     Patient location during evaluation: PACU Anesthesia Type: General Level of consciousness: oriented, sedated and patient cooperative Pain management: pain level controlled Vital Signs Assessment: post-procedure vital signs reviewed and stable Respiratory status: spontaneous breathing, nonlabored ventilation, respiratory function stable and patient connected to nasal cannula oxygen Cardiovascular status: blood pressure returned to baseline and stable Postop Assessment: no apparent nausea or vomiting Anesthetic complications: no   No notable events documented.  Last Vitals:  Vitals:   06/29/24 1130 06/29/24 1159  BP: (!) 156/80 (!) 140/67  Pulse: 79 (!) 101  Resp: 19   Temp: 36.5 C 36.6 C  SpO2: 94% 94%    Last Pain:  Vitals:   06/29/24 1130  TempSrc:   PainSc: 0-No pain                 Maurya Nethery,E. Orvie Caradine      "

## 2024-06-29 NOTE — Anesthesia Procedure Notes (Signed)
 Procedure Name: Intubation Date/Time: 06/29/2024 9:43 AM  Performed by: Claudene Florina Boga, CRNAPre-anesthesia Checklist: Patient identified, Emergency Drugs available, Suction available and Patient being monitored Patient Re-evaluated:Patient Re-evaluated prior to induction Oxygen Delivery Method: Circle System Utilized Preoxygenation: Pre-oxygenation with 100% oxygen Induction Type: IV induction, Rapid sequence and Cricoid Pressure applied Laryngoscope Size: Mac and 4 Grade View: Grade I Tube type: Oral Tube size: 7.5 mm Number of attempts: 1 Airway Equipment and Method: Stylet Placement Confirmation: ETT inserted through vocal cords under direct vision, positive ETCO2 and breath sounds checked- equal and bilateral Secured at: 22 cm Tube secured with: Tape Dental Injury: Teeth and Oropharynx as per pre-operative assessment

## 2024-06-29 NOTE — Transfer of Care (Signed)
 Immediate Anesthesia Transfer of Care Note  Patient: Franklin Ward  Procedure(s) Performed: LAPAROSCOPY, DIAGNOSTIC (Abdomen) LYSIS, ADHESIONS, LAPAROSCOPIC (Abdomen)  Patient Location: PACU  Anesthesia Type:General  Level of Consciousness: awake, alert , and oriented  Airway & Oxygen Therapy: Patient Spontanous Breathing and Patient connected to face mask oxygen  Post-op Assessment: Report given to RN and Post -op Vital signs reviewed and stable  Post vital signs: Reviewed and stable  Last Vitals:  Vitals Value Taken Time  BP 180/80 06/29/24 10:46  Temp    Pulse 100 06/29/24 10:48  Resp 17 06/29/24 10:48  SpO2 96 % 06/29/24 10:48  Vitals shown include unfiled device data.  Last Pain:  Vitals:   06/29/24 0853  TempSrc: Axillary  PainSc: 0-No pain         Complications: No notable events documented.

## 2024-06-29 NOTE — Anesthesia Preprocedure Evaluation (Addendum)
"                                    Anesthesia Evaluation  Patient identified by MRN, date of birth, ID band Patient awake    Reviewed: Allergy & Precautions, NPO status , Patient's Chart, lab work & pertinent test results, reviewed documented beta blocker date and time   History of Anesthesia Complications Negative for: history of anesthetic complications  Airway Mallampati: II  TM Distance: >3 FB Neck ROM: Full    Dental  (+) Missing, Dental Advisory Given   Pulmonary former smoker   breath sounds clear to auscultation       Cardiovascular hypertension, Pt. on medications and Pt. on home beta blockers (-) angina + CAD, + Past MI and + Cardiac Stents  + dysrhythmias Atrial Fibrillation  Rhythm:Regular Rate:Normal  10/25 Stress:    Small partially reversible perfusion defect at apical anterior wall and apex consistent with infarct with peri-infarct ischemia.  LVEF mildly reduced, 47%.  Overall, area of ischemia is small and study is low risk   Findings are consistent with infarction with peri-infarct ischemia. The study is low risk.   No ST deviation was noted.   LV perfusion is abnormal. There is evidence of ischemia. There is evidence of infarction. Defect 1: There is a small defect with moderate reduction in uptake present in the apical anterior and apex location(s) that is partially reversible. There is abnormal wall motion in the defect area. Consistent with peri-infarct ischemia.   Left ventricular function is abnormal. Nuclear stress EF: 47%.     Neuro/Psych negative neurological ROS     GI/Hepatic Neg liver ROS,,,SBO   Endo/Other  negative endocrine ROS    Renal/GU negative Renal ROS     Musculoskeletal   Abdominal   Peds  Hematology Plavix  Hb 16.2, plt 246k   Anesthesia Other Findings   Reproductive/Obstetrics                              Anesthesia Physical Anesthesia Plan  ASA: 3  Anesthesia Plan:  General   Post-op Pain Management: Ofirmev  IV (intra-op)*   Induction: Intravenous and Rapid sequence  PONV Risk Score and Plan: 2 and Ondansetron , Dexamethasone  and Treatment may vary due to age or medical condition  Airway Management Planned: Oral ETT  Additional Equipment: ClearSight  Intra-op Plan:   Post-operative Plan: Possible Post-op intubation/ventilation  Informed Consent: I have reviewed the patients History and Physical, chart, labs and discussed the procedure including the risks, benefits and alternatives for the proposed anesthesia with the patient or authorized representative who has indicated his/her understanding and acceptance.     Dental advisory given  Plan Discussed with: CRNA and Surgeon  Anesthesia Plan Comments:          Anesthesia Quick Evaluation  "

## 2024-06-30 ENCOUNTER — Encounter (HOSPITAL_COMMUNITY): Payer: Self-pay | Admitting: General Surgery

## 2024-06-30 DIAGNOSIS — E785 Hyperlipidemia, unspecified: Secondary | ICD-10-CM

## 2024-06-30 DIAGNOSIS — K56609 Unspecified intestinal obstruction, unspecified as to partial versus complete obstruction: Secondary | ICD-10-CM | POA: Diagnosis not present

## 2024-06-30 DIAGNOSIS — I493 Ventricular premature depolarization: Secondary | ICD-10-CM | POA: Diagnosis not present

## 2024-06-30 DIAGNOSIS — I10 Essential (primary) hypertension: Secondary | ICD-10-CM

## 2024-06-30 DIAGNOSIS — I2510 Atherosclerotic heart disease of native coronary artery without angina pectoris: Secondary | ICD-10-CM

## 2024-06-30 LAB — COMPREHENSIVE METABOLIC PANEL WITH GFR
ALT: 20 U/L (ref 0–44)
AST: 23 U/L (ref 15–41)
Albumin: 3 g/dL — ABNORMAL LOW (ref 3.5–5.0)
Alkaline Phosphatase: 50 U/L (ref 38–126)
Anion gap: 13 (ref 5–15)
BUN: 39 mg/dL — ABNORMAL HIGH (ref 8–23)
CO2: 19 mmol/L — ABNORMAL LOW (ref 22–32)
Calcium: 8.5 mg/dL — ABNORMAL LOW (ref 8.9–10.3)
Chloride: 109 mmol/L (ref 98–111)
Creatinine, Ser: 0.93 mg/dL (ref 0.61–1.24)
GFR, Estimated: >60 mL/min
Glucose, Bld: 93 mg/dL (ref 70–99)
Potassium: 4.4 mmol/L (ref 3.5–5.1)
Sodium: 140 mmol/L (ref 135–145)
Total Bilirubin: 0.4 mg/dL (ref 0.0–1.2)
Total Protein: 6.1 g/dL — ABNORMAL LOW (ref 6.5–8.1)

## 2024-06-30 LAB — CBC WITH DIFFERENTIAL/PLATELET
Abs Immature Granulocytes: 0.23 K/uL — ABNORMAL HIGH (ref 0.00–0.07)
Basophils Absolute: 0.1 K/uL (ref 0.0–0.1)
Basophils Relative: 1 %
Eosinophils Absolute: 0 K/uL (ref 0.0–0.5)
Eosinophils Relative: 0 %
HCT: 43.7 % (ref 39.0–52.0)
Hemoglobin: 14.9 g/dL (ref 13.0–17.0)
Immature Granulocytes: 2 %
Lymphocytes Relative: 8 %
Lymphs Abs: 1 K/uL (ref 0.7–4.0)
MCH: 31.8 pg (ref 26.0–34.0)
MCHC: 34.1 g/dL (ref 30.0–36.0)
MCV: 93.4 fL (ref 80.0–100.0)
Monocytes Absolute: 0.8 K/uL (ref 0.1–1.0)
Monocytes Relative: 6 %
Neutro Abs: 9.8 K/uL — ABNORMAL HIGH (ref 1.7–7.7)
Neutrophils Relative %: 83 %
Platelets: 200 K/uL (ref 150–400)
RBC: 4.68 MIL/uL (ref 4.22–5.81)
RDW: 14 % (ref 11.5–15.5)
WBC: 11.8 K/uL — ABNORMAL HIGH (ref 4.0–10.5)
nRBC: 0 % (ref 0.0–0.2)

## 2024-06-30 LAB — PHOSPHORUS: Phosphorus: 2.9 mg/dL (ref 2.5–4.6)

## 2024-06-30 LAB — MAGNESIUM: Magnesium: 2.3 mg/dL (ref 1.7–2.4)

## 2024-06-30 MED ORDER — SODIUM CHLORIDE 0.9 % IV SOLN: INTRAVENOUS | Status: AC | PRN

## 2024-06-30 MED ORDER — METOPROLOL TARTRATE 50 MG PO TABS
50.0000 mg | ORAL_TABLET | Freq: Two times a day (BID) | ORAL | Status: DC
  Administered 2024-06-30 – 2024-07-05 (×10): 50 mg via ORAL
  Filled 2024-06-30 (×10): qty 1

## 2024-06-30 NOTE — Progress Notes (Signed)
 1 Day Post-Op   Subjective/Chief Complaint: Passing flatus, feels well   Objective: Vital signs in last 24 hours: Temp:  [97.7 F (36.5 C)-99.2 F (37.3 C)] 98 F (36.7 C) (01/10 0726) Pulse Rate:  [78-101] 87 (01/10 0726) Resp:  [16-19] 18 (01/10 0726) BP: (112-180)/(65-80) 142/73 (01/10 0726) SpO2:  [92 %-95 %] 94 % (01/10 0726) Last BM Date : 06/29/24  Intake/Output from previous day: 01/09 0701 - 01/10 0700 In: 1069.9 [I.V.:700; NG/GT:60; IV Piggyback:309.9] Out: 415 [Urine:100; Emesis/NG output:315] Intake/Output this shift: No intake/output data recorded.  Ab soft approp tender incisions clean  Lab Results:  Recent Labs    06/28/24 1052 06/29/24 1305  WBC 10.9* 11.0*  HGB 16.2 15.8  HCT 49.9 47.8  PLT 246 160   BMET Recent Labs    06/28/24 1330 06/29/24 1305  NA 140 139  K 4.6 5.0  CL 108 108  CO2 20* 12*  GLUCOSE 104* 86  BUN 45* 44*  CREATININE 1.37* 1.12  CALCIUM  8.9 8.6*   PT/INR No results for input(s): LABPROT, INR in the last 72 hours. ABG No results for input(s): PHART, HCO3 in the last 72 hours.  Invalid input(s): PCO2, PO2  Studies/Results: DG Abd 1 View Result Date: 06/28/2024 EXAM: 1 VIEW XRAY OF THE ABDOMEN 06/28/2024 07:25:00 PM COMPARISON: 06/28/2024 CLINICAL HISTORY: Encounter for nasogastric (NG) tube placement FINDINGS: LINES, TUBES AND DEVICES: NG tube in place with tip overlying the upper stomach. Side port of NG tube 2.5 cm distal to expected location of GE junction. BOWEL: Dilated small bowel loops again noted. SOFT TISSUES: No abnormal calcifications. BONES: No acute fracture. PLEURAL SPACES: Small bilateral pleural effusions and bibasilar atelectasis. IMPRESSION: 1. NG tube in place with tip overlying the upper stomach and side port 2.5 cm distal to the expected location of the GE junction. 2. Dilated small bowel loops. 3. Small bilateral pleural effusions and bibasilar atelectasis. Electronically signed by: Elsie Gravely MD 06/28/2024 07:31 PM EST RP Workstation: [id number]   DG Abd Portable 1V Result Date: 06/28/2024 CLINICAL DATA:  Small bowel obstruction. EXAM: PORTABLE ABDOMEN - 1 VIEW COMPARISON:  06/27/2024 FINDINGS: Persistent dilated loops of small bowel. The degree of small bowel distension has minimally changed. Again noted is oral contrast within the sigmoid colon, right colon and transverse colon. Nasogastric tube tip is near the gastric fundus region. Stomach appears to be decompressed. Patchy densities at the lung bases could represent atelectasis. Cannot exclude small pleural effusions. IMPRESSION: 1. Persistent dilated loops of small bowel. Findings are compatible with a small bowel obstruction. Small bowel distension has not significantly changed. 2. Nasogastric tube tip is near the gastric fundus region. Electronically Signed   By: Juliene Balder M.D.   On: 06/28/2024 11:54    Anti-infectives: Anti-infectives (From admission, onward)    Start     Dose/Rate Route Frequency Ordered Stop   06/28/24 1200  Ampicillin -Sulbactam (UNASYN ) 3 g in sodium chloride  0.9 % 100 mL IVPB        3 g 200 mL/hr over 30 Minutes Intravenous Every 6 hours 06/28/24 1150         Assessment/Plan: POD 1 laparoscopic LOA -passing flatus, minimal gastric appearing contents in ng -dc ng -clears -would recommend pharm dvt prophylaxis    Donnice Bury 06/30/2024

## 2024-06-30 NOTE — Progress Notes (Signed)

## 2024-06-30 NOTE — Progress Notes (Signed)
 Physical Therapy Treatment Patient Details Name: Franklin Ward MRN: [id number] DOB: Nov 26, 1945 Today's Date: 06/30/2024   History of Present Illness 79 y.o. male presents to [facility] hospital on 06/22/2024 with nausea, vomiting and abdominal pain. Imaging notable for SBO. Pt underwent laparascopy for lysis of adhesions on 06/29/24. PMH includes HTN, HLD, CAD, PVCs, diverticulosis and abscess.    PT Comments  Pt now s/p laparoscopy with lysis for adhesions. Requiring min assist for all mobility. Expect he will make steady progress. Lives at home with wife who he provides care for. Other family members currently assisting with wife. Do not think he will be able to provide assist to wife. Patient will benefit from intensive inpatient follow-up therapy, >3 hours/day. If support to assist pt with mobility initially at home is arranged then could consider home.     If plan is discharge home, recommend the following: Assistance with cooking/housework;Assist for transportation;Help with stairs or ramp for entrance;A little help with walking and/or transfers;A little help with bathing/dressing/bathroom   Can travel by private vehicle        Equipment Recommendations  BSC/3in1    Recommendations for Other Services       Precautions / Restrictions Precautions Precautions: Fall;Other (comment) Recall of Precautions/Restrictions: Intact Precaution/Restrictions Comments: NG tube Restrictions Weight Bearing Restrictions Per Provider Order: No     Mobility  Bed Mobility Overal bed mobility: Needs Assistance Bed Mobility: Supine to Sit     Supine to sit: Min assist     General bed mobility comments: Assist to elevate trunk into sitting and bring hips to EOB    Transfers Overall transfer level: Needs assistance Equipment used: Rolling walker (2 wheels) Transfers: Sit to/from Stand Sit to Stand: Min assist           General transfer comment: Assist to power up     Ambulation/Gait Ambulation/Gait assistance: Min assist Gait Distance (Feet): 120 Feet Assistive device: Rolling walker (2 wheels) Gait Pattern/deviations: Step-through pattern, Decreased step length - right, Decreased step length - left, Decreased stride length, Trunk flexed Gait velocity: decr generally but at times speed picking up too much for pt to control Gait velocity interpretation: <1.8 ft/sec, indicate of risk for recurrent falls   General Gait Details: Pt with significant forward flexion and leaning onto walker. Frequent cues to stand more erect and stay closer to walker. Assist for balance and support as pt's control was impaired.   Stairs             Wheelchair Mobility     Tilt Bed    Modified Rankin (Stroke Patients Only)       Balance Overall balance assessment: Needs assistance Sitting-balance support: No upper extremity supported, Feet supported Sitting balance-Leahy Scale: Fair     Standing balance support: Reliant on assistive device for balance, Bilateral upper extremity supported, During functional activity Standing balance-Leahy Scale: Poor Standing balance comment: Pt dependent on RW and minA                            Communication Communication Communication: No apparent difficulties  Cognition Arousal: Alert Behavior During Therapy: WFL for tasks assessed/performed   PT - Cognitive impairments: No apparent impairments                         Following commands: Intact      Cueing Cueing Techniques: Verbal cues  Exercises  General Comments General comments (skin integrity, edema, etc.): VSS on RA      Pertinent Vitals/Pain Pain Assessment Pain Assessment: No/denies pain    Home Living                          Prior Function            PT Goals (current goals can now be found in the care plan section) Acute Rehab PT Goals Patient Stated Goal: Regain independence Progress towards PT  goals: Progressing toward goals    Frequency    Min 2X/week      PT Plan      Co-evaluation              AM-PAC PT 6 Clicks Mobility   Outcome Measure  Help needed turning from your back to your side while in a flat bed without using bedrails?: A Little Help needed moving from lying on your back to sitting on the side of a flat bed without using bedrails?: A Little Help needed moving to and from a bed to a chair (including a wheelchair)?: A Little Help needed standing up from a chair using your arms (e.g., wheelchair or bedside chair)?: A Little Help needed to walk in hospital room?: A Little Help needed climbing 3-5 steps with a railing? : Total 6 Click Score: 16    End of Session Equipment Utilized During Treatment: Gait belt Activity Tolerance: Patient tolerated treatment well Patient left: in chair;with call bell/phone within reach;with chair alarm set Nurse Communication: Mobility status PT Visit Diagnosis: Other abnormalities of gait and mobility (R26.89);Muscle weakness (generalized) (M62.81)     Time: 1135-1209 PT Time Calculation (min) (ACUTE ONLY): 34 min  Charges:    $Gait Training: 23-37 mins PT General Charges $$ ACUTE PT VISIT: 1 Visit                     [facility] PT Acute Rehabilitation Services Office [phone]    Rodgers ORN [facility] 06/30/2024, 1:47 PM

## 2024-06-30 NOTE — Hospital Course (Signed)
 Franklin Ward is a 79 y.o. male with medical history significant of hypertension, hyperlipidemia, CAD status post stent, PVCs, diverticulosis with history of diverticulitis and abscess presented with nausea, vomiting, abdominal pain. CT abdomen pelvis showed severe proximal small bowel dilation with transition zone consistent with SBO.  Diverticulosis without diverticulitis.    General surgery consulted. Had a prolonged hospital course with multiple NGT placements with brief transient episodes of getting better but Given lack of further improvement with medical management and clinical worsening was taken for Diagnostic Laparoscopy and LOA. In the interim he aspirated and had Acute Respiratory Failure and has been placed on IV Unasyn  and is getting a MBSS on 07/03/24.   He is now POD 4 Now on CLD -> SOFT. PT/OT recommending CIR and awaiting authorization. WBC continued to worsen but now finally improving.   Assessment and Plan:  SBO: Prolonged course and is S/p multiple NG placements with intermittent aspiration as below. He would get better and start improving but then clinically worsen. Given no Flatus on the AM of 06/30/23 Surgery has decided for surgical intervention; He is POD 4 of a diagnostic laparoscopy and lysis of adhesions.  Surgery has now removed his NG Tube and advanced his diet. He is now on a SOFT diet and tolerating however getting SLP to evaluate given his continued Cough with food intake.  -PT/OT recommending CIR and he appears to be a potential candidate given his weakness and Auth has been initiated.     Acute Respiratory Failure w/ Hypoxia secondary to Aspiration Pneumonia, not POA: On late afternoon of 06/27/2024, while working with PT patient was noted to have likely aspiration, became hypoxic with oxygen saturation of 85% requiring 5 L of oxygen.  -Initial X-ray was obtained which shows bibasilar airspace opacities which could be pneumonia or atelectasis.  CBC also showed  leukocytosis.  Patient was kept n.p.o. briefly, started on Unasyn  for concern of aspiration pneumonia. WBC current trend: Recent Labs  Lab 06/23/24 0846 06/28/24 1052 06/29/24 1305 06/30/24 1009 07/01/24 0543 07/01/24 1646 07/02/24 1019  WBC 6.7 10.9* 11.0* 11.8* 15.4* 18.0* 16.6*  -SLP to evaluate and treat and plan is for MBSS @ 12:30 this Afternoon -Add DuoNebs q6h, Guaifenesin  1200 mg po BID, Flutter Valve and Incentive Spirometry  -Repeat CXR showed Streaky atelectasis and or pneumonia in the left base with small pleural effusions, slightly increased as well as Platelike atelectasis in the RLL.. -Will need an Ambulatory Home O2 screen prior to D/C   Essential Hypertension: Continue home dose of metoprolol  as well as needed IV Hydralazine .  Blood pressure controlled. CTM BP per Protocol. Last BP reading was 117/58  Hypophosphatemia: Phos Level is now 2.2. Replete w/ po K Phos  Neutral 500 mg po BID. CTM and Replete as Necessary. Repeat CMP in the AM   Hyperlipidemia: Continue home Ezetimibe  10 mg po Daily  Abnormal LFTs: Mild and ? Reactive vs Medication induced. AST is now 46 -> 52. ALT went from 44 -> 69.  CTM and Trend and Repeat CMP in the AM and if continues worsen or not improving will need further W/U w/ Acute Hepatitis Panel and RUQ U/S  Metabolic Acidosis: Mild and improved. CO2 is now 25, AG is 7, and Chloride Level is 105. CTM and Trend and Repeat CMP in the AM   CAD Status post stenting:  Continue Ezetimibe  10 mg po Daily and Metoprolol  Tartrate 50 mg po BID. Held Clopidogrel  75 mg given Surgery as above and will 07/03/24  resume as it is ok with Surgery    PVCs: History of symptomatic PVCs. Continue Metoprolol  Tartrate 50 mg po BID and Amiodarone  200 mg po M-F and CTM on Telemetry  Sacral Ulcer: WOCN consult.  Wound 07/01/24 1553 Pressure Injury Buttocks Left;Medial Deep Tissue Pressure Injury - Purple or maroon localized area of discolored intact skin or blood-filled  blister due to damage of underlying soft tissue from pressure and/or shear. (Active)   Hypoalbuminemia: Patient's Albumin  Lvl went from 4.4 -> 3.8 -> 3.0 -> 2.6 x2 -> 2.7. CTM & Trend & Repeat CMP in the AM

## 2024-06-30 NOTE — Plan of Care (Signed)
   Problem: Education: Goal: Knowledge of General Education information will improve Description Including pain rating scale, medication(s)/side effects and non-pharmacologic comfort measures Outcome: Progressing

## 2024-06-30 NOTE — Progress Notes (Signed)
 " PROGRESS NOTE    Franklin Ward  FMW:[id number] DOB: Jul 09, 1945 DOA: 06/22/2024 PCP: Arloa Elsie JONELLE, MD   Brief Narrative:  Franklin Ward is a 79 y.o. male with medical history significant of hypertension, hyperlipidemia, CAD status post stent, PVCs, diverticulosis with history of diverticulitis and abscess presented with nausea, vomiting, abdominal pain for the last 3 days.  Denied any fever or chills, chest pain or shortness of breath.   Hemodynamically stable upon arrival to ED. CT abdomen pelvis showed severe proximal small bowel dilation with transition zone consistent with SBO.  Diverticulosis without diverticulitis.  General surgery consulted. Given lack of improvement with medical management taken for Diagnostic Laparoscopy and LOA. Now on CLD  Assessment and Plan:  SBO: Confirmed with abdominal series.  24-hour delay film worse today with clinically increased abdominal distention and still no bowel function.  Due to not passing flatus and increased abdominal distention, general surgery.  SBO protocol 06/24/2024 which again showed obstruction however patient started feeling better yesterday.  Patient had bowel movement on the night of 06/25/2024, NG tube clamped 06/27/2023, started on clear liquid diet, which was eventually advanced to full liquid diet, patient had bowel movement 2 days back-to-back however on the late afternoon of 06/27/2024, patient was noted to have aspiration and becoming hypoxic with worsened abdominal distention so NG tube was reinserted.  General surgery has reassessed him today, patient has been passing flatus, x-ray shows that the contrast is in the colon, NGT was once again removed, he was started on clear liquid diet, patient then developed distention, nausea vomiting, NG tube needed to be placed back yesterday.  He is not passing flatus this morning.  Surgery has decided for surgical intervention he is postoperative day 1 of a diagnostic laparoscopy and lysis of  adhesions.  Surgery has now advance his diet to clear liquid diet -PT/OT recommending CIR and he appears to be a potential candidate  Acute hypoxic respiratory failure secondary to aspiration pneumonia, not POA: On late afternoon of 06/27/2024, while working with PT patient was noted to have possible aspiration, became hypoxic with oxygen saturation of 85% requiring 5 L of oxygen.  X-ray was obtained which shows bibasilar airspace opacities which could be pneumonia or atelectasis.  CBC also showed leukocytosis.  Patient was kept n.p.o. briefly, started on Unasyn  for concern of aspiration pneumonia. WBC Trend: .lHave SLP evaluate.    Essential Hypertension: Continue home dose of metoprolol  as well as needed IV hydralazine .  Blood pressure controlled.   Hyperlipidemia: Continue home  Ezetimibe  10 mg po Dail  Metabolic Acidosis: Mild. CO2 is 19, AG is 13, and Chloride Level is 109. CTM and Trend and Repeat CMP in the AM   CAD Status post stenting:  Continue Ezetimibe  10 mg po Daily and Metoprolol  Tartrate 50 mg po BID. Held Plavix  given surgery as above. Will need to resume once ok with Surgery    PVCs: History of symptomatic PVCs. Continue metoprolol  and amiodarone  and CTM on Telemetry  Hypoalbuminemia: Patient's Albumin  Lvl went from 4.4 -> 3.8 -> 3.0. CTM & Trend & repeat CMP in the AM    DVT prophylaxis: SCDs Start: 06/22/24 1406    Code Status: Full Code Family Communication: No family present @ bedside  Disposition Plan:  Level of care: Telemetry Status is: Inpatient Remains inpatient appropriate because: Needs further clinical improvement   Consultants:  General surgery  Procedures:  As delineated as above  Antimicrobials:  Anti-infectives (From admission, onward)  Start     Dose/Rate Route Frequency Ordered Stop   06/28/24 1200  Ampicillin -Sulbactam (UNASYN ) 3 g in sodium chloride  0.9 % 100 mL IVPB        3 g 200 mL/hr over 30 Minutes Intravenous Every 6 hours 06/28/24  1150         Subjective: Seen and examined at bedside was doing a bit better.  Happy that he wanted to try diet again.  No nausea or vomiting.  Feels okay.  Hesitant about his NG tube coming out given that stenosis multiple times.  No other concerns or complaints at this time.  Objective: Vitals:   06/30/24 0359 06/30/24 0726 06/30/24 1550 06/30/24 1900  BP: 125/65 (!) 142/73 107/62 125/71  Pulse: 85 87 86 74  Resp: 16 18 18 17   Temp: 98.2 F (36.8 C) 98 F (36.7 C) 97.8 F (36.6 C) 97.8 F (36.6 C)  TempSrc: Axillary Oral Oral Axillary  SpO2: 92% 94% 93% 95%  Weight:      Height:        Intake/Output Summary (Last 24 hours) at 06/30/2024 1946 Last data filed at 06/30/2024 1807 Gross per 24 hour  Intake 1551.01 ml  Output 1050 ml  Net 501.01 ml   Filed Weights   06/22/24 0855  Weight: 77.8 kg   Examination: Physical Exam:  Constitutional: WN/WD elderly chronically ill-appearing Caucasian male Respiratory: Diminished to auscultation bilaterally, no wheezing, rales, rhonchi or crackles. Normal respiratory effort and patient is not tachypenic. No accessory muscle use.  Unlabored breathing Cardiovascular: RRR, no murmurs / rubs / gallops. S1 and S2 auscultated. No extremity edema.  Abdomen: Soft, non-tender, a little distended. Bowel sounds positive.  GU: Deferred. Musculoskeletal: No clubbing / cyanosis of digits/nails. No joint deformity upper and lower extremities. Skin: No rashes, lesions, ulcers on limited skin evaluation. No induration; Warm and dry.  Neurologic: CN 2-12 grossly intact with no focal deficits. Romberg sign and cerebellar reflexes not assessed.  Psychiatric: Normal judgment and insight. Alert and oriented x 3. Normal mood and appropriate affect.   Data Reviewed: I have personally reviewed following labs and imaging studies  CBC: Recent Labs  Lab 06/28/24 1052 06/29/24 1305 06/30/24 1009  WBC 10.9* 11.0* 11.8*  NEUTROABS 8.3* 9.7* 9.8*  HGB  16.2 15.8 14.9  HCT 49.9 47.8 43.7  MCV 94.5 94.8 93.4  PLT 246 160 200   Basic Metabolic Panel: Recent Labs  Lab 06/24/24 0626 06/26/24 0906 06/28/24 1330 06/29/24 1305 06/30/24 1009  NA 140 144 140 139 140  K 4.7 3.8 4.6 5.0 4.4  CL 102 111 108 108 109  CO2 22 20* 20* 12* 19*  GLUCOSE 74 81 104* 86 93  BUN 43* 32* 45* 44* 39*  CREATININE 0.98 0.73 1.37* 1.12 0.93  CALCIUM  8.8* 8.1* 8.9 8.6* 8.5*  MG 2.3  --   --   --  2.3  PHOS  --   --   --   --  2.9   GFR: Estimated Creatinine Clearance: 62.3 mL/min (by C-G formula based on SCr of 0.93 mg/dL). Liver Function Tests: Recent Labs  Lab 06/30/24 1009  AST 23  ALT 20  ALKPHOS 50  BILITOT 0.4  PROT 6.1*  ALBUMIN  3.0*   No results for input(s): LIPASE, AMYLASE in the last 168 hours. No results for input(s): AMMONIA in the last 168 hours. Coagulation Profile: No results for input(s): INR, PROTIME in the last 168 hours. Cardiac Enzymes: No results for input(s): CKTOTAL, CKMB,  CKMBINDEX, TROPONINI in the last 168 hours. BNP (last 3 results) No results for input(s): PROBNP in the last 8760 hours. HbA1C: No results for input(s): HGBA1C in the last 72 hours. CBG: No results for input(s): GLUCAP in the last 168 hours. Lipid Profile: No results for input(s): CHOL, HDL, LDLCALC, TRIG, CHOLHDL, LDLDIRECT in the last 72 hours. Thyroid  Function Tests: No results for input(s): TSH, T4TOTAL, FREET4, T3FREE, THYROIDAB in the last 72 hours. Anemia Panel: No results for input(s): VITAMINB12, FOLATE, FERRITIN, TIBC, IRON, RETICCTPCT in the last 72 hours. Sepsis Labs: No results for input(s): PROCALCITON, LATICACIDVEN in the last 168 hours.  No results found for this or any previous visit (from the past 240 hours).   Radiology Studies: No results found.  Scheduled Meds:  amiodarone   200 mg Oral Once per day on Monday Tuesday Wednesday Thursday Friday    cyanocobalamin   2,000 mcg Oral Daily   ezetimibe   10 mg Oral Daily   metoprolol  tartrate  50 mg Oral BID   sodium chloride  flush  3 mL Intravenous Q12H   Continuous Infusions:  ampicillin -sulbactam (UNASYN ) IV 3 g (06/30/24 1705)    LOS: 8 days   Alejandro Marker, DO [facility] Available via Epic secure chat 7am-7pm After these hours, please refer to coverage provider listed on [email or web address] 06/30/2024, 7:46 PM  "

## 2024-07-01 ENCOUNTER — Inpatient Hospital Stay (HOSPITAL_COMMUNITY)

## 2024-07-01 DIAGNOSIS — I10 Essential (primary) hypertension: Secondary | ICD-10-CM | POA: Diagnosis not present

## 2024-07-01 DIAGNOSIS — E785 Hyperlipidemia, unspecified: Secondary | ICD-10-CM | POA: Diagnosis not present

## 2024-07-01 DIAGNOSIS — I2510 Atherosclerotic heart disease of native coronary artery without angina pectoris: Secondary | ICD-10-CM | POA: Diagnosis not present

## 2024-07-01 DIAGNOSIS — K56609 Unspecified intestinal obstruction, unspecified as to partial versus complete obstruction: Secondary | ICD-10-CM | POA: Diagnosis not present

## 2024-07-01 LAB — CBC WITH DIFFERENTIAL/PLATELET
Abs Immature Granulocytes: 0.3 K/uL — ABNORMAL HIGH (ref 0.00–0.07)
Abs Immature Granulocytes: 0.35 K/uL — ABNORMAL HIGH (ref 0.00–0.07)
Basophils Absolute: 0.1 K/uL (ref 0.0–0.1)
Basophils Absolute: 0.1 K/uL (ref 0.0–0.1)
Basophils Relative: 0 %
Basophils Relative: 1 %
Eosinophils Absolute: 0 K/uL (ref 0.0–0.5)
Eosinophils Absolute: 0 K/uL (ref 0.0–0.5)
Eosinophils Relative: 0 %
Eosinophils Relative: 0 %
HCT: 40.7 % (ref 39.0–52.0)
HCT: 45 % (ref 39.0–52.0)
Hemoglobin: 13.6 g/dL (ref 13.0–17.0)
Hemoglobin: 15 g/dL (ref 13.0–17.0)
Immature Granulocytes: 2 %
Immature Granulocytes: 2 %
Lymphocytes Relative: 8 %
Lymphocytes Relative: 9 %
Lymphs Abs: 1.2 K/uL (ref 0.7–4.0)
Lymphs Abs: 1.6 K/uL (ref 0.7–4.0)
MCH: 31.1 pg (ref 26.0–34.0)
MCH: 30.9 pg (ref 26.0–34.0)
MCHC: 33.4 g/dL (ref 30.0–36.0)
MCHC: 33.3 g/dL (ref 30.0–36.0)
MCV: 92.9 fL (ref 80.0–100.0)
MCV: 92.8 fL (ref 80.0–100.0)
Monocytes Absolute: 1.2 K/uL — ABNORMAL HIGH (ref 0.1–1.0)
Monocytes Absolute: 1.8 K/uL — ABNORMAL HIGH (ref 0.1–1.0)
Monocytes Relative: 8 %
Monocytes Relative: 10 %
Neutro Abs: 12.6 K/uL — ABNORMAL HIGH (ref 1.7–7.7)
Neutro Abs: 14.2 K/uL — ABNORMAL HIGH (ref 1.7–7.7)
Neutrophils Relative %: 82 %
Neutrophils Relative %: 78 %
Platelets: 198 K/uL (ref 150–400)
Platelets: 198 K/uL (ref 150–400)
RBC: 4.38 MIL/uL (ref 4.22–5.81)
RBC: 4.85 MIL/uL (ref 4.22–5.81)
RDW: 13.9 % (ref 11.5–15.5)
RDW: 13.7 % (ref 11.5–15.5)
WBC: 15.4 K/uL — ABNORMAL HIGH (ref 4.0–10.5)
WBC: 18 K/uL — ABNORMAL HIGH (ref 4.0–10.5)
nRBC: 0 % (ref 0.0–0.2)
nRBC: 0 % (ref 0.0–0.2)

## 2024-07-01 LAB — COMPREHENSIVE METABOLIC PANEL WITH GFR
ALT: 18 U/L (ref 0–44)
AST: 28 U/L (ref 15–41)
Albumin: 2.6 g/dL — ABNORMAL LOW (ref 3.5–5.0)
Alkaline Phosphatase: 47 U/L (ref 38–126)
Anion gap: 9 (ref 5–15)
BUN: 32 mg/dL — ABNORMAL HIGH (ref 8–23)
CO2: 21 mmol/L — ABNORMAL LOW (ref 22–32)
Calcium: 7.8 mg/dL — ABNORMAL LOW (ref 8.9–10.3)
Chloride: 107 mmol/L (ref 98–111)
Creatinine, Ser: 0.8 mg/dL (ref 0.61–1.24)
GFR, Estimated: >60 mL/min
Glucose, Bld: 96 mg/dL (ref 70–99)
Potassium: 4.4 mmol/L (ref 3.5–5.1)
Sodium: 137 mmol/L (ref 135–145)
Total Bilirubin: 0.4 mg/dL (ref 0.0–1.2)
Total Protein: 5.2 g/dL — ABNORMAL LOW (ref 6.5–8.1)

## 2024-07-01 LAB — PHOSPHORUS: Phosphorus: 1.8 mg/dL — ABNORMAL LOW (ref 2.5–4.6)

## 2024-07-01 LAB — MAGNESIUM: Magnesium: 2 mg/dL (ref 1.7–2.4)

## 2024-07-01 MED ORDER — ENSURE PLUS HIGH PROTEIN PO LIQD
237.0000 mL | Freq: Two times a day (BID) | ORAL | Status: DC
  Administered 2024-07-01 – 2024-07-05 (×7): 237 mL via ORAL
  Filled 2024-07-01: qty 237
  Filled 2024-07-01 (×2): qty 330
  Filled 2024-07-01 (×9): qty 237

## 2024-07-01 MED ORDER — SODIUM PHOSPHATES 45 MMOLE/15ML IV SOLN
30.0000 mmol | Freq: Once | INTRAVENOUS | Status: AC
  Administered 2024-07-01: 30 mmol via INTRAVENOUS
  Filled 2024-07-01: qty 10

## 2024-07-01 MED ORDER — TRAMADOL HCL 50 MG PO TABS: 50.0000 mg | ORAL_TABLET | Freq: Four times a day (QID) | ORAL | 0 refills | Status: AC | PRN

## 2024-07-01 NOTE — Progress Notes (Signed)
 2 Days Post-Op   Subjective/Chief Complaint: Flatus, tol clears   Objective: Vital signs in last 24 hours: Temp:  [97.8 F (36.6 C)-98 F (36.7 C)] 98 F (36.7 C) (01/11 0734) Pulse Rate:  [61-86] 69 (01/11 0734) Resp:  [16-18] 18 (01/11 0734) BP: (107-137)/(62-79) 132/68 (01/11 0734) SpO2:  [93 %-95 %] 93 % (01/11 0734) Last BM Date : 06/29/24  Intake/Output from previous day: 01/10 0701 - 01/11 0700 In: 1972.4 [P.O.:1320; I.V.:41.4; IV Piggyback:611] Out: 1150 [Urine:1150] Intake/Output this shift: Total I/O In: 360 [P.O.:360] Out: -   Ab soft approp tender incisions clean  Lab Results:  Recent Labs    06/30/24 1009 07/01/24 0543  WBC 11.8* 15.4*  HGB 14.9 13.6  HCT 43.7 40.7  PLT 200 198   BMET Recent Labs    06/30/24 1009 07/01/24 0543  NA 140 137  K 4.4 4.4  CL 109 107  CO2 19* 21*  GLUCOSE 93 96  BUN 39* 32*  CREATININE 0.93 0.80  CALCIUM  8.5* 7.8*   PT/INR No results for input(s): LABPROT, INR in the last 72 hours. ABG No results for input(s): PHART, HCO3 in the last 72 hours.  Invalid input(s): PCO2, PO2  Studies/Results: No results found.  Anti-infectives: Anti-infectives (From admission, onward)    Start     Dose/Rate Route Frequency Ordered Stop   06/28/24 1200  Ampicillin -Sulbactam (UNASYN ) 3 g in sodium chloride  0.9 % 100 mL IVPB        3 g 200 mL/hr over 30 Minutes Intravenous Every 6 hours 06/28/24 1150         Assessment/Plan: POD 2 laparoscopic LOA -passing flatus still and tol clear -soft diet -would recommend pharm dvt prophylaxis while here -can dc home when tolerating soft diet and cleared medically even as early as today is fine with me  Donnice Bury 07/01/2024

## 2024-07-01 NOTE — Progress Notes (Signed)
 " PROGRESS NOTE    Franklin Ward  FMW:[id number] DOB: [birth year] DOA: 06/22/2024 PCP: Arloa Elsie JONELLE, MD   Brief Narrative:  Franklin Ward is a 79 y.o. male with medical history significant of hypertension, hyperlipidemia, CAD status post stent, PVCs, diverticulosis with history of diverticulitis and abscess presented with nausea, vomiting, abdominal pain for the last 3 days.  Denied any fever or chills, chest pain or shortness of breath.   Hemodynamically stable upon arrival to ED. CT abdomen pelvis showed severe proximal small bowel dilation with transition zone consistent with SBO.  Diverticulosis without diverticulitis.  General surgery consulted. Given lack of improvement with medical management taken for Diagnostic Laparoscopy and LOA. Now on CLD -> SOFT. PT/OT recommending CIR and he may be a candidate.   Assessment and Plan:  SBO: Confirmed with abdominal series.  24-hour delay film worse today with clinically increased abdominal distention and still no bowel function.  Due to not passing flatus and increased abdominal distention, general surgery.  SBO protocol 06/24/2024 which again showed obstruction however patient started feeling better yesterday.   -Patient had bowel movement on the night of 06/25/2024, NG tube clamped 06/27/2023, started on clear liquid diet, which was eventually advanced to full liquid diet, patient had bowel movement 2 days back-to-back however on the late afternoon of 06/27/2024, patient was noted to have aspiration and becoming hypoxic with worsened abdominal distention so NG tube was reinserted.   -General surgery has reassessed him, patient had been passing flatus, x-ray shows that the contrast is in the colon, NGT was once again removed, he was started back on CLD but subsequently patient then developed distention, nausea vomiting, NG tube needed to be placed back  -Given no Flatus on the AM of 06/30/23 Surgery has decided for surgical intervention; He is  postoperative day 2 of a diagnostic laparoscopy and lysis of adhesions.  Surgery has now removed his NG Tube and advanced his diet. He is now on a SOFT diet. -PT/OT recommending CIR and he appears to be a potential candidate given his weakness  Acute hypoxic respiratory failure secondary to aspiration pneumonia, not POA: On late afternoon of 06/27/2024, while working with PT patient was noted to have possible aspiration, became hypoxic with oxygen saturation of 85% requiring 5 L of oxygen.  X-ray was obtained which shows bibasilar airspace opacities which could be pneumonia or atelectasis.  CBC also showed leukocytosis.  Patient was kept n.p.o. briefly, started on Unasyn  for concern of aspiration pneumonia.  Recent Labs  Lab 06/22/24 0553 06/23/24 0846 06/28/24 1052 06/29/24 1305 06/30/24 1009 07/01/24 0543  WBC 6.4 6.7 10.9* 11.0* 11.8* 15.4*  -SLP to evaluate and treat; Repeating CBC this Afternoon -Will repeat CXR in the AM and will need an Ambulatory Home O2 screen prior to D/C   Essential Hypertension: Continue home dose of metoprolol  as well as needed IV hydralazine .  Blood pressure controlled. CTM BP per Protocol. Last BP reading was 124/63  Hypophosphatemia: Phos Level is now 1.8. Replete w/ IV Na Phos 30 mmol. CTM and Replete as Necessary. Repeat CMP in the AM   Hyperlipidemia: Continue home  Ezetimibe  10 mg po Daily  Metabolic Acidosis: Mild and improving. CO2 is now 21, AG is 9, and Chloride Level is 107. CTM and Trend and Repeat CMP in the AM   CAD Status post stenting:  Continue Ezetimibe  10 mg po Daily and Metoprolol  Tartrate 50 mg po BID. Held Plavix  given surgery as above. Will need to resume  once ok with Surgery    PVCs: History of symptomatic PVCs. Continue metoprolol  and amiodarone  and CTM on Telemetry  Hypoalbuminemia: Patient's Albumin  Lvl went from 4.4 -> 3.8 -> 3.0 -> 2.6. CTM & Trend & repeat CMP in the AM    DVT prophylaxis: SCDs Start: 06/22/24 1406    Code  Status: Full Code Family Communication: No family present @ bedside  Disposition Plan:  Level of care: Telemetry Status is: Inpatient Remains inpatient appropriate because: PT/OT Recommending CIR   Consultants:  CIR General Surgery  Procedures:  As delineated as above  Antimicrobials:  Anti-infectives (From admission, onward)    Start     Dose/Rate Route Frequency Ordered Stop   06/28/24 1200  Ampicillin -Sulbactam (UNASYN ) 3 g in sodium chloride  0.9 % 100 mL IVPB        3 g 200 mL/hr over 30 Minutes Intravenous Every 6 hours 06/28/24 1150         Subjective: Seen and examined at bedside and states he is extremely weak and that this is weak as he has ever been. Happy that his diet is being advanced.  No lightheadedness or dizziness.  WBC continues to worsen so we will recheck this afternoon.  No other concerns or complaints at this time  Objective: Vitals:   07/01/24 0300 07/01/24 0734 07/01/24 1110 07/01/24 1608  BP: 137/79 132/68 102/67 124/63  Pulse: 62 69 64 71  Resp: 16 18 18 17   Temp: 97.9 F (36.6 C) 98 F (36.7 C) 97.7 F (36.5 C) (!) 97.5 F (36.4 C)  TempSrc: Axillary   Oral  SpO2: 94% 93% 97% 98%  Weight:      Height:        Intake/Output Summary (Last 24 hours) at 07/01/2024 1821 Last data filed at 07/01/2024 1820 Gross per 24 hour  Intake 947.32 ml  Output 600 ml  Net 347.32 ml   Filed Weights   06/22/24 0855  Weight: 77.8 kg   Examination: Physical Exam:  Constitutional: WN/WD elderly chronically appearing Caucasian male who appears fatigued Respiratory: Diminished to auscultation bilaterally with some coarse breath sounds and has some slight rhonchi but no appreciable wheezing, rales or crackles. Normal respiratory effort and patient is not tachypenic. No accessory muscle use.  Unlabored breathing Cardiovascular: RRR, no murmurs / rubs / gallops. S1 and S2 auscultated. No extremity edema. Abdomen: Soft, non-tender, a little distended secondary  to body habitus. Bowel sounds positive.  GU: Deferred. Musculoskeletal: No clubbing / cyanosis of digits/nails. No joint deformity upper and lower extremities. Skin: No rashes, lesions, ulcers on a limited skin evaluation. No induration; Warm and dry.  Neurologic: CN 2-12 grossly intact with no focal deficits. Romberg sign and cerebellar reflexes not assessed.  Psychiatric: Normal judgment and insight. Alert and oriented x 3. Normal mood and appropriate affect.   Data Reviewed: I have personally reviewed following labs and imaging studies  CBC: Recent Labs  Lab 06/28/24 1052 06/29/24 1305 06/30/24 1009 07/01/24 0543 07/01/24 1646  WBC 10.9* 11.0* 11.8* 15.4* 18.0*  NEUTROABS 8.3* 9.7* 9.8* 12.6* 14.2*  HGB 16.2 15.8 14.9 13.6 15.0  HCT 49.9 47.8 43.7 40.7 45.0  MCV 94.5 94.8 93.4 92.9 92.8  PLT 246 160 200 198 198   Basic Metabolic Panel: Recent Labs  Lab 06/26/24 0906 06/28/24 1330 06/29/24 1305 06/30/24 1009 07/01/24 0543  NA 144 140 139 140 137  K 3.8 4.6 5.0 4.4 4.4  CL 111 108 108 109 107  CO2 20* 20* 12*  19* 21*  GLUCOSE 81 104* 86 93 96  BUN 32* 45* 44* 39* 32*  CREATININE 0.73 1.37* 1.12 0.93 0.80  CALCIUM  8.1* 8.9 8.6* 8.5* 7.8*  MG  --   --   --  2.3 2.0  PHOS  --   --   --  2.9 1.8*   GFR: Estimated Creatinine Clearance: 72.4 mL/min (by C-G formula based on SCr of 0.8 mg/dL). Liver Function Tests: Recent Labs  Lab 06/30/24 1009 07/01/24 0543  AST 23 28  ALT 20 18  ALKPHOS 50 47  BILITOT 0.4 0.4  PROT 6.1* 5.2*  ALBUMIN  3.0* 2.6*   No results for input(s): LIPASE, AMYLASE in the last 168 hours. No results for input(s): AMMONIA in the last 168 hours. Coagulation Profile: No results for input(s): INR, PROTIME in the last 168 hours. Cardiac Enzymes: No results for input(s): CKTOTAL, CKMB, CKMBINDEX, TROPONINI in the last 168 hours. BNP (last 3 results) No results for input(s): PROBNP in the last 8760 hours. HbA1C: No results  for input(s): HGBA1C in the last 72 hours. CBG: No results for input(s): GLUCAP in the last 168 hours. Lipid Profile: No results for input(s): CHOL, HDL, LDLCALC, TRIG, CHOLHDL, LDLDIRECT in the last 72 hours. Thyroid  Function Tests: No results for input(s): TSH, T4TOTAL, FREET4, T3FREE, THYROIDAB in the last 72 hours. Anemia Panel: No results for input(s): VITAMINB12, FOLATE, FERRITIN, TIBC, IRON, RETICCTPCT in the last 72 hours. Sepsis Labs: No results for input(s): PROCALCITON, LATICACIDVEN in the last 168 hours.  No results found for this or any previous visit (from the past 240 hours).   Radiology Studies: DG Abd 1 View Result Date: 07/01/2024 CLINICAL DATA:  Bowel obstruction. EXAM: ABDOMEN - 1 VIEW COMPARISON:  06/28/2024 FINDINGS: Diffuse gaseous small bowel dilatation is again noted through the abdomen and pelvis with small bowel loops in the left upper quadrant measuring up the 3.3 cm diameter. Gas and opacified stool is seen along the length of the nondilated colon down to the rectum. NG tube has been removed in the interval. IMPRESSION: Persistent diffuse gaseous small bowel dilatation with gas and stool in the nondilated colon. No substantial interval change. Underlying component of small-bowel obstruction or diffuse ileus would be considerations. Electronically Signed   By: Camellia Candle M.D.   On: 07/01/2024 08:15   Scheduled Meds:  amiodarone   200 mg Oral Once per day on Monday Tuesday Wednesday Thursday Friday   cyanocobalamin   2,000 mcg Oral Daily   ezetimibe   10 mg Oral Daily   feeding supplement  237 mL Oral BID BM   metoprolol  tartrate  50 mg Oral BID   sodium chloride  flush  3 mL Intravenous Q12H   Continuous Infusions:  sodium chloride  10 mL/hr at 06/30/24 2339   ampicillin -sulbactam (UNASYN ) IV 3 g (07/01/24 1735)   sodium PHOSPHATE  IVPB (in mmol) 30 mmol (07/01/24 1813)    LOS: 9 days   Alejandro Marker, DO [facility] Available via Epic secure chat 7am-7pm After these hours, please refer to coverage provider listed on [email or web address] 07/01/2024, 6:21 PM  "

## 2024-07-01 NOTE — Plan of Care (Signed)
   Problem: Activity: Goal: Risk for activity intolerance will decrease Outcome: Progressing   Problem: Nutrition: Goal: Adequate nutrition will be maintained Outcome: Progressing   Problem: Coping: Goal: Level of anxiety will decrease Outcome: Progressing

## 2024-07-01 NOTE — Discharge Instructions (Signed)
 [facility] LAPAROSCOPIC SURGERY: POST OP INSTRUCTIONS  Always review your discharge instruction sheet given to you by the facility where your surgery was performed. IF YOU HAVE DISABILITY OR FAMILY LEAVE FORMS, YOU MUST BRING THEM TO THE OFFICE FOR PROCESSING.   DO NOT GIVE THEM TO YOUR DOCTOR.  A prescription for pain medication may be given to you upon discharge.  Take your pain medication as prescribed, if needed.  If narcotic pain medicine is not needed, then you may take acetaminophen  (Tylenol ), naprosyn (Alleve), or ibuprofen  (Advil ) as needed. Take your usually prescribed medications unless otherwise directed. If you need a refill on your pain medication, please contact your pharmacy.  They will contact our office to request authorization. Prescriptions will not be filled after 5pm or on week-ends. You should follow a light diet the first few days after arrival home, such as soup and crackers, etc.  Be sure to include lots of fluids daily. Most patients will experience some swelling and bruising in the area of the incisions.  Ice packs will help.  Swelling and bruising can take several days to resolve.  It is common to experience some constipation if taking pain medication after surgery.  Increasing fluid intake and taking a stool softener (such as Colace) will usually help or prevent this problem from occurring.  A mild laxative (Milk of Magnesia or Miralax) should be taken according to package instructions if there are no bowel movements after 48 hours. Unless discharge instructions indicate otherwise, you may remove your bandages 48 hours after surgery, and you may shower at that time.  You may have steri-strips (small skin tapes) in place directly over the incision.  These strips should be left on the skin for 7-10 days.  If your surgeon used skin glue on the incision, you may shower in 24 hours.  The glue will flake off over the next  2-3 weeks.  Any sutures or staples will be removed at the office during your follow-up visit. ACTIVITIES:  You may resume regular (light) daily activities beginning the next day--such as daily self-care, walking, climbing stairs--gradually increasing activities as tolerated.  You may have sexual intercourse when it is comfortable.  Refrain from any heavy lifting or straining until approved by your doctor. You may drive when you are no longer taking prescription pain medication, you can comfortably wear a seatbelt, and you can safely maneuver your car and apply brakes. RETURN TO WORK:  __________________________________________________________ Franklin Ward should see your doctor in the office for a follow-up appointment approximately 2-3 weeks after your surgery.  Make sure that you call for this appointment within a day or two after you arrive home to insure a convenient appointment time. OTHER INSTRUCTIONS: __________________________________________________________________________________________________________________________ __________________________________________________________________________________________________________________________ WHEN TO CALL YOUR DOCTOR: Fever over 101.0 Inability to urinate Continued bleeding from incision. Increased pain, redness, or drainage from the incision. Increasing abdominal pain  The clinic staff is available to answer your questions during regular business hours.  Please don't hesitate to call and ask to speak to one of the nurses for clinical concerns.  If you have a medical emergency, go to the nearest emergency room or call 911.  A surgeon from [facility] is always on call at the hospital. [address], [address], [place], [place]  [address] ? [address] [address], [place], [place]   [address] [phone] ? [phone] ? FAX [phone] Web site: [email or web address]

## 2024-07-02 ENCOUNTER — Inpatient Hospital Stay (HOSPITAL_COMMUNITY)

## 2024-07-02 DIAGNOSIS — K56609 Unspecified intestinal obstruction, unspecified as to partial versus complete obstruction: Secondary | ICD-10-CM | POA: Diagnosis not present

## 2024-07-02 DIAGNOSIS — I2510 Atherosclerotic heart disease of native coronary artery without angina pectoris: Secondary | ICD-10-CM | POA: Diagnosis not present

## 2024-07-02 DIAGNOSIS — I10 Essential (primary) hypertension: Secondary | ICD-10-CM | POA: Diagnosis not present

## 2024-07-02 DIAGNOSIS — E785 Hyperlipidemia, unspecified: Secondary | ICD-10-CM | POA: Diagnosis not present

## 2024-07-02 LAB — COMPREHENSIVE METABOLIC PANEL WITH GFR
ALT: 44 U/L (ref 0–44)
AST: 46 U/L — ABNORMAL HIGH (ref 15–41)
Albumin: 2.6 g/dL — ABNORMAL LOW (ref 3.5–5.0)
Alkaline Phosphatase: 51 U/L (ref 38–126)
Anion gap: 9 (ref 5–15)
BUN: 21 mg/dL (ref 8–23)
CO2: 22 mmol/L (ref 22–32)
Calcium: 8 mg/dL — ABNORMAL LOW (ref 8.9–10.3)
Chloride: 106 mmol/L (ref 98–111)
Creatinine, Ser: 0.95 mg/dL (ref 0.61–1.24)
GFR, Estimated: >60 mL/min
Glucose, Bld: 109 mg/dL — ABNORMAL HIGH (ref 70–99)
Potassium: 4 mmol/L (ref 3.5–5.1)
Sodium: 137 mmol/L (ref 135–145)
Total Bilirubin: 0.4 mg/dL (ref 0.0–1.2)
Total Protein: 5.2 g/dL — ABNORMAL LOW (ref 6.5–8.1)

## 2024-07-02 LAB — CBC WITH DIFFERENTIAL/PLATELET
Abs Immature Granulocytes: 0.3 K/uL — ABNORMAL HIGH (ref 0.00–0.07)
Basophils Absolute: 0.1 K/uL (ref 0.0–0.1)
Basophils Relative: 0 %
Eosinophils Absolute: 0.1 K/uL (ref 0.0–0.5)
Eosinophils Relative: 1 %
HCT: 41.6 % (ref 39.0–52.0)
Hemoglobin: 14.2 g/dL (ref 13.0–17.0)
Immature Granulocytes: 2 %
Lymphocytes Relative: 7 %
Lymphs Abs: 1.1 K/uL (ref 0.7–4.0)
MCH: 31.3 pg (ref 26.0–34.0)
MCHC: 34.1 g/dL (ref 30.0–36.0)
MCV: 91.6 fL (ref 80.0–100.0)
Monocytes Absolute: 1.2 K/uL — ABNORMAL HIGH (ref 0.1–1.0)
Monocytes Relative: 7 %
Neutro Abs: 13.9 K/uL — ABNORMAL HIGH (ref 1.7–7.7)
Neutrophils Relative %: 83 %
Platelets: 202 K/uL (ref 150–400)
RBC: 4.54 MIL/uL (ref 4.22–5.81)
RDW: 13.6 % (ref 11.5–15.5)
WBC: 16.6 K/uL — ABNORMAL HIGH (ref 4.0–10.5)
nRBC: 0 % (ref 0.0–0.2)

## 2024-07-02 LAB — MAGNESIUM: Magnesium: 1.8 mg/dL (ref 1.7–2.4)

## 2024-07-02 LAB — PHOSPHORUS: Phosphorus: 2 mg/dL — ABNORMAL LOW (ref 2.5–4.6)

## 2024-07-02 MED ORDER — K PHOS MONO-SOD PHOS DI & MONO 155-852-130 MG PO TABS
500.0000 mg | ORAL_TABLET | Freq: Two times a day (BID) | ORAL | Status: AC
  Administered 2024-07-02 (×2): 500 mg via ORAL
  Filled 2024-07-02 (×2): qty 2

## 2024-07-02 MED ORDER — MAGNESIUM SULFATE 2 GM/50ML IV SOLN
2.0000 g | Freq: Once | INTRAVENOUS | Status: AC
  Administered 2024-07-02: 2 g via INTRAVENOUS
  Filled 2024-07-02: qty 50

## 2024-07-02 MED ORDER — GUAIFENESIN ER 600 MG PO TB12
1200.0000 mg | ORAL_TABLET | Freq: Two times a day (BID) | ORAL | Status: DC
  Administered 2024-07-02 – 2024-07-05 (×6): 1200 mg via ORAL
  Filled 2024-07-02 (×6): qty 2

## 2024-07-02 MED ORDER — IPRATROPIUM-ALBUTEROL 0.5-2.5 (3) MG/3ML IN SOLN
3.0000 mL | Freq: Four times a day (QID) | RESPIRATORY_TRACT | Status: DC
  Administered 2024-07-02 – 2024-07-03 (×3): 3 mL via RESPIRATORY_TRACT
  Filled 2024-07-02 (×3): qty 3

## 2024-07-02 NOTE — Plan of Care (Signed)

## 2024-07-02 NOTE — Progress Notes (Signed)
 "  Progress Note  3 Days Post-Op  Subjective: Patient denies pain. Tolerating soft diet. Denies n/v. Having bowel function and flatulence.   ROS  All negative with the exception of above.  Objective: Vital signs in last 24 hours: Temp:  [97.5 F (36.4 C)-98.7 F (37.1 C)] 98.1 F (36.7 C) (01/12 0420) Pulse Rate:  [64-78] 78 (01/12 0420) Resp:  [16-18] 16 (01/12 0420) BP: (102-151)/(63-90) 147/90 (01/12 0420) SpO2:  [93 %-98 %] 93 % (01/12 0420) Last BM Date : 06/29/24  Intake/Output from previous day: 01/11 0701 - 01/12 0700 In: 921.2 [P.O.:360; I.V.:1.2; IV Piggyback:560] Out: 800 [Urine:800] Intake/Output this shift: No intake/output data recorded.  PE: General: Pleasant male who is laying in bed eating breakfast in NAD. HEENT: Head is normocephalic, atraumatic.  Heart: HR normal during encounter.  Lungs: Respiratory effort nonlabored on room air. Abd: Soft with distention. NTTP. Incisions with dermabond C/D/I. No rebound tenderness or guarding.  MS: Able to move all 4 extremities.  Skin: Warm and dry.  Psych: A&Ox3 with an appropriate affect.   Lab Results:  Recent Labs    07/01/24 0543 07/01/24 1646  WBC 15.4* 18.0*  HGB 13.6 15.0  HCT 40.7 45.0  PLT 198 198   BMET Recent Labs    06/30/24 1009 07/01/24 0543  NA 140 137  K 4.4 4.4  CL 109 107  CO2 19* 21*  GLUCOSE 93 96  BUN 39* 32*  CREATININE 0.93 0.80  CALCIUM  8.5* 7.8*   PT/INR No results for input(s): LABPROT, INR in the last 72 hours. CMP     Component Value Date/Time   NA 137 07/01/2024 0543   NA 137 03/22/2024 0955   K 4.4 07/01/2024 0543   CL 107 07/01/2024 0543   CO2 21 (L) 07/01/2024 0543   GLUCOSE 96 07/01/2024 0543   BUN 32 (H) 07/01/2024 0543   BUN 16 03/22/2024 0955   CREATININE 0.80 07/01/2024 0543   CALCIUM  7.8 (L) 07/01/2024 0543   PROT 5.2 (L) 07/01/2024 0543   PROT 7.2 03/22/2024 0955   ALBUMIN  2.6 (L) 07/01/2024 0543   ALBUMIN  4.6 03/22/2024 0955   AST 28  07/01/2024 0543   ALT 18 07/01/2024 0543   ALKPHOS 47 07/01/2024 0543   BILITOT 0.4 07/01/2024 0543   BILITOT 0.3 03/22/2024 0955   GFRNONAA >60 07/01/2024 0543   GFRAA 104 08/21/2018 1311   Lipase     Component Value Date/Time   LIPASE 24 06/22/2024 0553       Studies/Results: DG Abd 1 View Result Date: 07/01/2024 CLINICAL DATA:  Bowel obstruction. EXAM: ABDOMEN - 1 VIEW COMPARISON:  06/28/2024 FINDINGS: Diffuse gaseous small bowel dilatation is again noted through the abdomen and pelvis with small bowel loops in the left upper quadrant measuring up the 3.3 cm diameter. Gas and opacified stool is seen along the length of the nondilated colon down to the rectum. NG tube has been removed in the interval. IMPRESSION: Persistent diffuse gaseous small bowel dilatation with gas and stool in the nondilated colon. No substantial interval change. Underlying component of small-bowel obstruction or diffuse ileus would be considerations. Electronically Signed   By: Camellia Candle M.D.   On: 07/01/2024 08:15    Anti-infectives: Anti-infectives (From admission, onward)    Start     Dose/Rate Route Frequency Ordered Stop   06/28/24 1200  Ampicillin -Sulbactam (UNASYN ) 3 g in sodium chloride  0.9 % 100 mL IVPB        3 g 200 mL/hr  over 30 Minutes Intravenous Every 6 hours 06/28/24 1150          Assessment/Plan POD 3: S/P Diagnostic laparoscopy, lyse of adhesions by AR on 1/9 -Afebrile. -WBC from 1/11 18.0 -Having bowel function and flatulence. Tolerating soft diet. No n/v, pain.  -Okay for regular diet.  -Patient stable from general surgery standpoint and can discharge home when medically cleared. Outpatient follow is being arranged.   FEN: Advanced to [facility] diet; IVF per primary team. VTE: Wound recommend pharm DVT prophylaxis while here. ID: [email or web address]  due to aspiration pneumonia.   LOS: 10 days   I reviewed hospitalist notes, specialist notes, nursing notes, last 24 h vitals and pain  scores, last 48 h intake and output, last 24 h labs and trends, and last 24 h imaging results.  Marjorie Carlyon Favre, [facility] 07/02/2024, 8:15 AM Please see Amion for pager number during day hours 7:00am-4:30pm  "

## 2024-07-02 NOTE — Progress Notes (Signed)
 Inpatient Rehab Admissions:  Inpatient Rehab Consult received.  I met with patient at the bedside for rehabilitation assessment and to discuss goals and expectations of an inpatient rehab admission.  Discussed average length of stay, insurance authorization requirement and discharge home after completion of CIR. Pt acknowledged understanding and would like to pursue CIR. Pt gave permission to contact daughter Jon. Spoke with Jon on the phone. She also acknowledged understanding of CIR goals and expectations. She is supportive of pt pursuing CIR. Jon confirmed that she will be able to provide 24/7 support for pt after discharge. Will continue to follow.  Signed: Tinnie Yvone Cohens, MS, CCC-SLP Admissions Coordinator [phone]

## 2024-07-02 NOTE — TOC Progression Note (Signed)
 Transition of Care [facility]) - Progression Note    Patient Details  Name: Franklin Ward MRN: [id number] Date of Birth: 09/22/1945  [facility] CM/SW Contact  Inocente GORMAN Kindle, LCSW Phone Number: 07/02/2024, 8:39 AM  Clinical Narrative:    CSW following for CIR determination of candidacy and home supports.    Expected Discharge Plan: IP Rehab Facility Barriers to Discharge: English As A Second Language Teacher, Continued Medical Work up               Expected Discharge Plan and Services   Discharge Planning Services: CM Consult   Living arrangements for the past 2 months: Single Family Home                                       Social Drivers of Health (SDOH) Interventions SDOH Screenings   Food Insecurity: No Food Insecurity (06/22/2024)  Housing: Low Risk (06/22/2024)  Transportation Needs: No Transportation Needs (06/22/2024)  Utilities: Not At Risk (06/22/2024)  Social Connections: Moderately Isolated (06/22/2024)  Tobacco Use: Medium Risk (06/29/2024)    Readmission Risk Interventions     No data to display

## 2024-07-02 NOTE — PMR Pre-admission (Shared)
 PMR Admission Coordinator Pre-Admission Assessment  Patient: Franklin Ward is an 79 y.o., male MRN: [id number] DOB: 01/08/1946 Height: 5' 8 (172.7 cm) Weight: 77.8 kg  Insurance Information HMO: ***    PPO: ***     PCP:      IPA:      80/20:      OTHER: *** PRIMARY: U[employer] Medicare      Policy#: [id number]      Subscriber: patient CM Name: ***      Phone#: ***     Fax#: *** Pre-Cert#: ***      Employer: *** Benefits:  Phone #: ***     Name: *** Eustacio. Date: ***     Deduct: ***      Out of Pocket Max: ***      Life Max: *** CIR: ***      SNF: *** Outpatient: ***     Co-Pay: *** Home Health: ***      Co-Pay: *** DME: ***     Co-Pay: *** Providers: in-network SECONDARY:       Policy#:      Phone#:   Financial Counselor:       Phone#:   The Best Boy for patients in Inpatient Rehabilitation Facilities with attached Privacy Act Statement-Health Care Records was provided and verbally reviewed with: Patient  Emergency Contact Information Contact Information     Name Relation Home Work Mobile   Omdahl,Angela Daughter   [phone]      Other Contacts   None on File     Current Medical History  Patient Admitting Diagnosis: SBO History of Present Illness: Pt is a 79 year old male with medical hx significant for: HTN, hyperlipidemia, CAD d/t stent, PVCs, diverticulitis with h/o diverticulitis and abscess. Pt presented to [facility] on 06/22/24 d/t abdominal pain and vomiting. CT abdomen/pelvis showed severe proximal small bowel dilation with transition zone consistent with small bowel obstruction.Diverticulosis without diverticulitis. Mild prostatic enlargement, left inguinal hernia. NG tube placed. Surgery consulted. 24 hour film worse on 1/4 with increase in abdominal distention and gastric distention. Began full liquid diet on 1/6. NG replaced after possible aspiration event and hypoxia with worsened abdominal distention on 1/8. X-ray showed  bibasilar airspace opacities. Started on Unasyn . NG removed again and restarted on clear liquid diet. Pt developed distention, nausea, vomiting; NG replaced. Pt underwent exploratory laparoscopy and lysis of adhesions on 1/9 by Dr. Rubin. Diet advanced to regular. *** Therapy evaluations completed and CIR recommended d/t pt's deficits in functional mobility.    Patient's medical record from [facility] has been reviewed by the rehabilitation admission coordinator and physician.  Past Medical History  Past Medical History:  Diagnosis Date   Coronary artery disease    Diverticulitis    NSTEMI (non-ST elevated myocardial infarction) (HCC) 08/09/2018    Has the patient had major surgery during 100 days prior to admission? Yes  Family History   family history includes Lung cancer in his father.  Current Medications Current Medications[1]  Patients Current Diet:  Diet Order             Diet Heart Room service appropriate? Yes; Fluid consistency: Thin  Diet effective now                   Precautions / Restrictions Precautions Precautions: Fall, Other (comment) Precaution/Restrictions Comments: NG tube Restrictions Weight Bearing Restrictions Per Provider Order: No   Has the patient had 2 or more falls or  a fall with injury in the past year? No  Prior Activity Level Limited Community (1-2x/wk): MD appointments  Prior Functional Level Self Care: Did the patient need help bathing, dressing, using the toilet or eating? Independent  Indoor Mobility: Did the patient need assistance with walking from room to room (with or without device)? Independent  Stairs: Did the patient need assistance with internal or external stairs (with or without device)? Independent  Functional Cognition: Did the patient need help planning regular tasks such as shopping or remembering to take medications? Independent  Patient Information Are you of Hispanic, Latino/a,or Spanish origin?:  A. No, not of Hispanic, Latino/a, or Spanish origin What is your race?: A. White Do you need or want an interpreter to communicate with a doctor or health care staff?: 0. No  Patient's Response To:  Health Literacy and Transportation Is the patient able to respond to health literacy and transportation needs?: Yes Health Literacy - How often do you need to have someone help you when you read instructions, pamphlets, or other written material from your doctor or pharmacy?: Never In the past 12 months, has lack of transportation kept you from medical appointments or from getting medications?: No In the past 12 months, has lack of transportation kept you from meetings, work, or from getting things needed for daily living?: No  Home Assistive Devices / Equipment Home Equipment: Agricultural Consultant (2 wheels), [employer] - single point  Prior Device Use: Indicate devices/aids used by the patient prior to current illness, exacerbation or injury? None of the above  Current Functional Level Cognition  Orientation Level: Oriented X4    Extremity Assessment (includes Sensation/Coordination)  Upper Extremity Assessment: Overall WFL for tasks assessed  Lower Extremity Assessment: Defer to PT evaluation    ADLs  Overall ADL's : Needs assistance/impaired Eating/Feeding: NPO Grooming: Oral care, Set up, Sitting Upper Body Bathing: Set up, Sitting Lower Body Bathing: Moderate assistance, Sit to/from stand Upper Body Dressing : Set up, Sitting Lower Body Dressing: Moderate assistance Toilet Transfer: Minimal assistance, BSC/3in1, Stand-pivot    Mobility  Overal bed mobility: Needs Assistance Bed Mobility: Supine to Sit, Sit to Supine Supine to sit: Min assist, HOB elevated, Used rails Sit to supine: Mod assist, Used rails General bed mobility comments: Min A to facilitate scoot to EOB. Pt returned to supine, required Mod A and BUE assist to pull body towards [facility] with bed positioned in [place]  position.    Transfers  Overall transfer level: Needs assistance Equipment used: Rolling walker (2 wheels) Transfers: Sit to/from Stand Sit to Stand: Min assist Bed to/from chair/wheelchair/BSC transfer type:: Step pivot Step pivot transfers: Min assist General transfer comment: Pt with two sit to stand transfers, initial stand with RW and three steps taken to the R towards HOB. Second transfer stand with RUE stabilized on bed rail and no DME utilized. Pt able to maintain standing balance with Min A for bed pad change.    Ambulation / Gait / Stairs / Wheelchair Mobility  Ambulation/Gait Ambulation/Gait assistance: Editor, Commissioning (Feet): 120 Feet Assistive device: Rolling walker (2 wheels) Gait Pattern/deviations: Step-through pattern, Decreased step length - right, Decreased step length - left, Decreased stride length, Trunk flexed General Gait Details: Pt with significant forward flexion and leaning onto walker. Frequent cues to stand more erect and stay closer to walker. Assist for balance and support as pt's control was impaired. Gait velocity: decr generally but at times speed picking up too much for pt to control Gait  velocity interpretation: <1.8 ft/sec, indicate of risk for recurrent falls    Posture / Balance Balance Overall balance assessment: Needs assistance Sitting-balance support: No upper extremity supported, Feet supported Sitting balance-Leahy Scale: Fair Standing balance support: Bilateral upper extremity supported, Single extremity supported, During functional activity Standing balance-Leahy Scale: Poor Standing balance comment: Dependent on external support    Special considerations/life events  Skin Wound: abdomen/left, upper and lower, mid, lower; Pressure Injury: buttocks/left, medial   Previous Home Environment (from acute therapy documentation) Living Arrangements: Spouse/significant other  Lives With: Spouse Available Help at Discharge: Family,  Available 24 hours/day Type of Home: House Home Layout: One level Home Access: Stairs to enter Entrance Stairs-Rails: None Entrance Stairs-Number of Steps: 1 Bathroom Shower/Tub: Psychologist, counselling, Engineer, Manufacturing Systems: Standard Bathroom Accessibility: Yes How Accessible: Accessible via walker Home Care Services: No  Discharge Living Setting Plans for Discharge Living Setting: Patient's home Type of Home at Discharge: House Discharge Home Layout: One level Discharge Home Access: Stairs to enter Entrance Stairs-Rails: None Entrance Stairs-Number of Steps: 1 Discharge Bathroom Shower/Tub: Walk-in shower, Tub/shower unit Discharge Bathroom Toilet: Standard Discharge Bathroom Accessibility: Yes How Accessible: Accessible via walker Does the patient have any problems obtaining your medications?: No  Social/Family/Support Systems Anticipated Caregiver: Jed Kutch (daughter) Anticipated Caregiver's Contact Information: [phone] Caregiver Availability: 24/7 Discharge Plan Discussed with Primary Caregiver: Yes Is Caregiver In Agreement with Plan?: Yes Does Caregiver/Family have Issues with Lodging/Transportation while Pt is in Rehab?: No  Goals Patient/Family Goal for Rehab: *** Expected length of stay: *** Pt/Family Agrees to Admission and willing to participate: Yes Program Orientation Provided & Reviewed with Pt/Caregiver Including Roles  & Responsibilities: Yes  Decrease burden of Care through IP rehab admission: NA  Possible need for SNF placement upon discharge: Not anticipated  Patient Condition: {PATIENT'S CONDITION:22832}  Preadmission Screen Completed By:  Tinnie SHAUNNA Yvone Delayne, 07/02/2024 3:53 PM ______________________________________________________________________   Discussed status with Dr. PIERRETTE on *** at *** and received approval for admission today.  Admission Coordinator:  Tinnie SHAUNNA Yvone Delayne, CCC-SLP, time ***/Date ***    Assessment/Plan: Diagnosis: *** Does the need for close, 24 hr/day Medical supervision in concert with the patient's rehab needs make it unreasonable for this patient to be served in a less intensive setting? {yes_no_potentially:3041433} Co-Morbidities requiring supervision/potential complications: *** Due to {due un:6958565}, does the patient require 24 hr/day rehab nursing? {yes_no_potentially:3041433} Does the patient require coordinated care of a physician, rehab nurse, PT, OT, and SLP to address physical and functional deficits in the context of the above medical diagnosis(es)? {yes_no_potentially:3041433} Addressing deficits in the following areas: {deficits:3041436} Can the patient actively participate in an intensive therapy program of at least 3 hrs of therapy 5 days a week? {yes_no_potentially:3041433} The potential for patient to make measurable gains while on inpatient rehab is {potential:3041437} Anticipated functional outcomes upon discharge from inpatient rehab: {functional outcomes:304600100} PT, {functional outcomes:304600100} OT, {functional outcomes:304600100} SLP Estimated rehab length of stay to reach the above functional goals is: *** Anticipated discharge destination: {anticipated dc setting:21604} 10. Overall Rehab/Functional Prognosis: {potential:3041437}   MD Signature: ***    [1]  Current Facility-Administered Medications:    amiodarone  (PACERONE ) tablet 200 mg, 200 mg, Oral, Once per day on Monday Tuesday Wednesday Thursday Friday, Rubin Calamity, MD, 200 mg at 07/02/24 9066   Ampicillin -Sulbactam (UNASYN ) 3 g in sodium chloride  0.9 % 100 mL IVPB, 3 g, Intravenous, Q6H, Rubin Calamity, MD, Stopped at 07/02/24 1400   artificial tears ophthalmic solution 1 drop, 1 drop,  Both Eyes, Daily PRN, Rubin Calamity, MD, 1 drop at 07/01/24 1735   cyanocobalamin  (VITAMIN B12) tablet 2,000 mcg, 2,000 mcg, Oral, Daily, Ramirez, Armando, MD, 2,000 mcg at 07/02/24 9066    ezetimibe  (ZETIA ) tablet 10 mg, 10 mg, Oral, Daily, Ramirez, Armando, MD, 10 mg at 07/02/24 0933   feeding supplement (ENSURE PLUS HIGH PROTEIN) liquid 237 mL, 237 mL, Oral, BID BM, Sheikh, Omair Latif, DO, 237 mL at 07/01/24 1406   hydrALAZINE  (APRESOLINE ) injection 10 mg, 10 mg, Intravenous, Q6H PRN, Rubin Calamity, MD, 10 mg at 06/26/24 0158   HYDROmorphone  (DILAUDID ) injection 1 mg, 1 mg, Intravenous, Q2H PRN, Rubin Calamity, MD   ipratropium-albuterol  (DUONEB) 0.5-2.5 (3) MG/3ML nebulizer solution 3 mL, 3 mL, Nebulization, Q6H, Sheikh, Omair Latif, DO, 3 mL at 07/02/24 1325   magnesium  sulfate IVPB 2 g 50 mL, 2 g, Intravenous, Once, Sheikh, Omair Latif, DO   metoprolol  tartrate (LOPRESSOR ) tablet 50 mg, 50 mg, Oral, BID, Jimenez, Aileen, RPH, 50 mg at 07/02/24 9066   ondansetron  (ZOFRAN ) injection 4 mg, 4 mg, Intravenous, Q6H PRN, Rubin Calamity, MD, 4 mg at 06/26/24 2049   Oral care mouth rinse, 15 mL, Mouth Rinse, PRN, Rubin Calamity, MD   phenol (CHLORASEPTIC) mouth spray 1 spray, 1 spray, Mouth/Throat, PRN, Rubin Calamity, MD, 1 spray at 06/24/24 1557   phosphorus (K PHOS  NEUTRAL) tablet 500 mg, 500 mg, Oral, BID, Sheikh, Omair Latif, DO   sodium chloride  flush (NS) 0.9 % injection 3 mL, 3 mL, Intravenous, Q12H, Rubin Calamity, MD, 3 mL at 07/02/24 [phone]

## 2024-07-02 NOTE — Progress Notes (Signed)
 Occupational Therapy Treatment Patient Details Name: Franklin Ward MRN: [id number] DOB: [birth year] Today's Date: 07/02/2024   History of present illness 79 y.o. male presents to [facility] hospital on 06/22/2024 with nausea, vomiting and abdominal pain. Imaging notable for SBO. Pt underwent laparascopy for lysis of adhesions on 06/29/24. PMH includes HTN, HLD, CAD, PVCs, diverticulosis and abscess.   OT comments  Pt now s/p laparoscopy with lysis for adhesions. Required up to Mod A for bed mobility this date, Min A for sit to stand transfers. Grooming tasks completed seated EOB, set-up A required. Pt continues to require increased assistance for LB tasks. Continues to benefit from acute skilled OT services to facilitate progress towards goals. Continue per POC.       If plan is discharge home, recommend the following:  A lot of help with bathing/dressing/bathroom;A lot of help with walking and/or transfers   Equipment Recommendations  None recommended by OT    Recommendations for Other Services      Precautions / Restrictions Precautions Precautions: Fall;Other (comment) Recall of Precautions/Restrictions: Intact Restrictions Weight Bearing Restrictions Per Provider Order: No       Mobility Bed Mobility Overal bed mobility: Needs Assistance Bed Mobility: Supine to Sit, Sit to Supine     Supine to sit: Min assist, HOB elevated, Used rails Sit to supine: Mod assist, Used rails   General bed mobility comments: Min A to facilitate scoot to EOB. Pt returned to supine, required Mod A and BUE assist to pull body towards [facility] with bed positioned in [place] position.    Transfers Overall transfer level: Needs assistance Equipment used: Rolling walker (2 wheels) Transfers: Sit to/from Stand Sit to Stand: Min assist           General transfer comment: Pt with two sit to stand transfers, initial stand with RW and three steps taken to the R towards HOB. Second transfer stand with  RUE stabilized on bed rail and no DME utilized. Pt able to maintain standing balance with Min A for bed pad change.     Balance Overall balance assessment: Needs assistance Sitting-balance support: No upper extremity supported, Feet supported Sitting balance-Leahy Scale: Fair     Standing balance support: Bilateral upper extremity supported, Single extremity supported, During functional activity Standing balance-Leahy Scale: Poor Standing balance comment: Dependent on external support                           ADL either performed or assessed with clinical judgement   ADL Overall ADL's : Needs assistance/impaired     Grooming: Oral care;Set up;Sitting               Lower Body Dressing: Moderate assistance                      Extremity/Trunk Assessment Upper Extremity Assessment Upper Extremity Assessment: Overall WFL for tasks assessed            Vision       Perception     Praxis     Communication Communication Communication: No apparent difficulties   Cognition Arousal: Alert Behavior During Therapy: WFL for tasks assessed/performed Cognition: No apparent impairments                               Following commands: Intact        Cueing   Cueing Techniques: Verbal cues  Exercises      Shoulder Instructions       General Comments VSS on RA    Pertinent Vitals/ Pain       Pain Assessment Pain Assessment: No/denies pain  Home Living                                          Prior Functioning/Environment              Frequency  Min 2X/week        Progress Toward Goals  OT Goals(current goals can now be found in the care plan section)  Progress towards OT goals: Progressing toward goals  Acute Rehab OT Goals Patient Stated Goal: Get better OT Goal Formulation: With patient Time For Goal Achievement: 07/12/24 Potential to Achieve Goals: Good ADL Goals Pt Will Perform  Grooming: with modified independence;standing Pt Will Perform Lower Body Dressing: with modified independence;sit to/from stand Pt Will Transfer to Toilet: with modified independence;ambulating;regular height toilet  Plan      Co-evaluation                 AM-PAC OT 6 Clicks Daily Activity     Outcome Measure   Help from another person eating meals?: None Help from another person taking care of personal grooming?: A Little Help from another person toileting, which includes using toliet, bedpan, or urinal?: A Lot Help from another person bathing (including washing, rinsing, drying)?: A Lot Help from another person to put on and taking off regular upper body clothing?: A Little Help from another person to put on and taking off regular lower body clothing?: A Lot 6 Click Score: 16    End of Session Equipment Utilized During Treatment: Rolling walker (2 wheels);Gait belt  OT Visit Diagnosis: Unsteadiness on feet (R26.81);Muscle weakness (generalized) (M62.81)   Activity Tolerance Patient tolerated treatment well   Patient Left in bed;with call bell/phone within reach;with bed alarm set   Nurse Communication Mobility status        Time: 9093-9072 OT Time Calculation (min): 21 min  Charges: OT General Charges $OT Visit: 1 Visit OT Treatments $Therapeutic Activity: 8-22 mins  Franklin Ward, OTR/L.  [facility] Acute Rehabilitation  Office: [phone]   Franklin PARAS Cayla Wiegand 07/02/2024, 11:33 AM

## 2024-07-02 NOTE — Progress Notes (Signed)
 " PROGRESS NOTE    Franklin Ward  FMW:[id number] DOB: Nov 24, 1945 DOA: 06/22/2024 PCP: Arloa Elsie JONELLE, MD   Brief Narrative:  Franklin Ward is a 79 y.o. male with medical history significant of hypertension, hyperlipidemia, CAD status post stent, PVCs, diverticulosis with history of diverticulitis and abscess presented with nausea, vomiting, abdominal pain for the last 3 days.  Denied any fever or chills, chest pain or shortness of breath.   Hemodynamically stable upon arrival to ED. CT abdomen pelvis showed severe proximal small bowel dilation with transition zone consistent with SBO.  Diverticulosis without diverticulitis.  General surgery consulted. Given lack of improvement with medical management taken for Diagnostic Laparoscopy and LOA. POD 3 Now on CLD -> SOFT. PT/OT recommending CIR and he may be a candidate However WBC continued to worsen but is finally slowly improving.  Assessment and Plan:  SBO: Confirmed with abdominal series.  24-hour delay film worse today with clinically increased abdominal distention and still no bowel function.  Due to not passing flatus and increased abdominal distention, general surgery.  SBO protocol 06/24/2024 which again showed obstruction however patient started feeling better yesterday.   -Patient had bowel movement on the night of 06/25/2024, NG tube clamped 06/27/2023, started on clear liquid diet, which was eventually advanced to full liquid diet, patient had bowel movement 2 days back-to-back however on the late afternoon of 06/27/2024, patient was noted to have aspiration and becoming hypoxic with worsened abdominal distention so NG tube was reinserted.   -General surgery has reassessed him, patient had been passing flatus, x-ray shows that the contrast is in the colon, NGT was once again removed, he was started back on CLD but subsequently patient then developed distention, nausea vomiting, NG tube needed to be placed back  -Given no Flatus on the AM of  06/30/23 Surgery has decided for surgical intervention; He is postoperative day 3 of a diagnostic laparoscopy and lysis of adhesions.  Surgery has now removed his NG Tube and advanced his diet. He is now on a SOFT diet and tolerating  -PT/OT recommending CIR and he appears to be a potential candidate given his weakness;   Acute Respiratory Failure w/ Hypoxia secondary to aspiration pneumonia, not POA: On late afternoon of 06/27/2024, while working with PT patient was noted to have possible aspiration, became hypoxic with oxygen saturation of 85% requiring 5 L of oxygen. Initial X-ray was obtained which shows bibasilar airspace opacities which could be pneumonia or atelectasis.  CBC also showed leukocytosis.  Patient was kept n.p.o. briefly, started on Unasyn  for concern of aspiration pneumonia. WBC current trend: Recent Labs  Lab 06/23/24 0846 06/28/24 1052 06/29/24 1305 06/30/24 1009 07/01/24 0543 07/01/24 1646 07/02/24 1019  WBC 6.7 10.9* 11.0* 11.8* 15.4* 18.0* 16.6*  -SLP to evaluate and treat; Repeating CBC this Afternoon -Add DuoNebs q6h, Guaifenesin  1200 mg po BID, Flutter Valve and Incentive Spirometry  -Repeat CXR this AM is pending formal read. Will need an Ambulatory Home O2 screen prior to D/C   Essential Hypertension: Continue home dose of metoprolol  as well as needed IV hydralazine .  Blood pressure controlled. CTM BP per Protocol. Last BP reading was 141/70  Hypophosphatemia: Phos Level is now 2.0. Replete w/ po K Phos  Neutral 500 mg po BID. CTM and Replete as Necessary. Repeat CMP in the AM   Hyperlipidemia: Continue home Ezetimibe  10 mg po Daily  Elevated AST: Mild. AST is now 46. CTM and Trend and Repeat CMP in the AM  Metabolic Acidosis: Mild and improved. CO2 is now 22, AG is 9, and Chloride Level is 106. CTM and Trend and Repeat CMP in the AM   CAD Status post stenting:  Continue Ezetimibe  10 mg po Daily and Metoprolol  Tartrate 50 mg po BID. Held Plavix  given surgery as  above. Will need to resume once ok with Surgery    PVCs: History of symptomatic PVCs. Continue Metoprolol  and Amiodarone  and CTM on Telemetry  Hypoalbuminemia: Patient's Albumin  Lvl went from 4.4 -> 3.8 -> 3.0 -> 2.6 x2. CTM & Trend & repeat CMP in the AM    DVT prophylaxis: SCDs Start: 06/22/24 1406    Code Status: Full Code Family Communication: No family present @ bedside   Disposition Plan:  Level of care: Telemetry Status is: Inpatient Remains inpatient appropriate because: Needs CIR but needs to have improvement in WBC prior    Consultants:  General Surgery  CIR  Procedures:  Diagnostic Lap and Exploratory Lap w/ LOA by Dr. Rubin  Antimicrobials:  Anti-infectives (From admission, onward)    Start     Dose/Rate Route Frequency Ordered Stop   06/28/24 1200  Ampicillin -Sulbactam (UNASYN ) 3 g in sodium chloride  0.9 % 100 mL IVPB        3 g 200 mL/hr over 30 Minutes Intravenous Every 6 hours 06/28/24 1150         Subjective: Seen and examined at bedside was extremely fatigued and weak.  No nausea or vomiting.  Shortness of breath is slowly improved.  No headedness or dizziness.  No other concerns or complaints at this time.  Objective: Vitals:   07/01/24 2326 07/02/24 0420 07/02/24 0828 07/02/24 1733  BP: (!) 151/79 (!) 147/90 (!) 141/70 139/63  Pulse: 72 78 73 67  Resp: 17 16    Temp: 98.7 F (37.1 C) 98.1 F (36.7 C) 97.9 F (36.6 C) 98.1 F (36.7 C)  TempSrc:  Oral Oral Oral  SpO2: 93% 93% 94% 98%  Weight:      Height:        Intake/Output Summary (Last 24 hours) at 07/02/2024 1747 Last data filed at 07/02/2024 1632 Gross per 24 hour  Intake 666.27 ml  Output 600 ml  Net 66.27 ml   Filed Weights   06/22/24 0855  Weight: 77.8 kg   Examination: Physical Exam:  Constitutional: WN/WD elderly chronically ill-appearing Caucasian male who appears fatigued Respiratory: Diminished to auscultation bilaterally with some coarse breath sounds and has some  slight rhonchi but no appreciable wheezing rales or crackles. Normal respiratory effort and patient is not tachypenic. No accessory muscle use.  Unlabored breathing but is wearing supplemental oxygen nasal cannula Cardiovascular: RRR, no murmurs / rubs / gallops. S1 and S2 auscultated. No extremity edema.  Abdomen: Soft, non-tender, slightly distended second but habitus. Bowel sounds positive.  GU: Deferred. Musculoskeletal: No clubbing / cyanosis of digits/nails. No joint deformity upper and lower extremities.  Skin: No rashes, lesions, ulcers on a limited skin evaluation. No induration; Warm and dry.  Neurologic: CN 2-12 grossly intact with no focal deficits. Romberg sign and cerebellar reflexes not assessed.  Psychiatric: Normal judgment and insight. Alert and oriented x 3. Normal mood and appropriate affect.   Data Reviewed: I have personally reviewed following labs and imaging studies  CBC: Recent Labs  Lab 06/29/24 1305 06/30/24 1009 07/01/24 0543 07/01/24 1646 07/02/24 1019  WBC 11.0* 11.8* 15.4* 18.0* 16.6*  NEUTROABS 9.7* 9.8* 12.6* 14.2* 13.9*  HGB 15.8 14.9 13.6 15.0 14.2  HCT 47.8 43.7 40.7 45.0 41.6  MCV 94.8 93.4 92.9 92.8 91.6  PLT 160 200 198 198 202   Basic Metabolic Panel: Recent Labs  Lab 06/28/24 1330 06/29/24 1305 06/30/24 1009 07/01/24 0543 07/02/24 1019  NA 140 139 140 137 137  K 4.6 5.0 4.4 4.4 4.0  CL 108 108 109 107 106  CO2 20* 12* 19* 21* 22  GLUCOSE 104* 86 93 96 109*  BUN 45* 44* 39* 32* 21  CREATININE 1.37* 1.12 0.93 0.80 0.95  CALCIUM  8.9 8.6* 8.5* 7.8* 8.0*  MG  --   --  2.3 2.0 1.8  PHOS  --   --  2.9 1.8* 2.0*   GFR: Estimated Creatinine Clearance: 61 mL/min (by C-G formula based on SCr of 0.95 mg/dL). Liver Function Tests: Recent Labs  Lab 06/30/24 1009 07/01/24 0543 07/02/24 1019  AST 23 28 46*  ALT 20 18 44  ALKPHOS 50 47 51  BILITOT 0.4 0.4 0.4  PROT 6.1* 5.2* 5.2*  ALBUMIN  3.0* 2.6* 2.6*   No results for input(s):  LIPASE, AMYLASE in the last 168 hours. No results for input(s): AMMONIA in the last 168 hours. Coagulation Profile: No results for input(s): INR, PROTIME in the last 168 hours. Cardiac Enzymes: No results for input(s): CKTOTAL, CKMB, CKMBINDEX, TROPONINI in the last 168 hours. BNP (last 3 results) No results for input(s): PROBNP in the last 8760 hours. HbA1C: No results for input(s): HGBA1C in the last 72 hours. CBG: No results for input(s): GLUCAP in the last 168 hours. Lipid Profile: No results for input(s): CHOL, HDL, LDLCALC, TRIG, CHOLHDL, LDLDIRECT in the last 72 hours. Thyroid  Function Tests: No results for input(s): TSH, T4TOTAL, FREET4, T3FREE, THYROIDAB in the last 72 hours. Anemia Panel: No results for input(s): VITAMINB12, FOLATE, FERRITIN, TIBC, IRON, RETICCTPCT in the last 72 hours. Sepsis Labs: No results for input(s): PROCALCITON, LATICACIDVEN in the last 168 hours.  No results found for this or any previous visit (from the past 240 hours).   Radiology Studies: DG Abd 1 View Result Date: 07/01/2024 CLINICAL DATA:  Bowel obstruction. EXAM: ABDOMEN - 1 VIEW COMPARISON:  06/28/2024 FINDINGS: Diffuse gaseous small bowel dilatation is again noted through the abdomen and pelvis with small bowel loops in the left upper quadrant measuring up the 3.3 cm diameter. Gas and opacified stool is seen along the length of the nondilated colon down to the rectum. NG tube has been removed in the interval. IMPRESSION: Persistent diffuse gaseous small bowel dilatation with gas and stool in the nondilated colon. No substantial interval change. Underlying component of small-bowel obstruction or diffuse ileus would be considerations. Electronically Signed   By: Camellia Candle M.D.   On: 07/01/2024 08:15   Scheduled Meds:  amiodarone   200 mg Oral Once per day on Monday Tuesday Wednesday Thursday Friday   cyanocobalamin   2,000 mcg Oral  Daily   ezetimibe   10 mg Oral Daily   feeding supplement  237 mL Oral BID BM   guaiFENesin   1,200 mg Oral BID   ipratropium-albuterol   3 mL Nebulization Q6H   metoprolol  tartrate  50 mg Oral BID   phosphorus  500 mg Oral BID   sodium chloride  flush  3 mL Intravenous Q12H   Continuous Infusions:  ampicillin -sulbactam (UNASYN ) IV 3 g (07/02/24 1732)    LOS: 10 days   Alejandro Marker, DO [facility] Available via Epic secure chat 7am-7pm After these hours, please refer to coverage provider listed on [email or web address] 07/02/2024, 5:47 PM  "

## 2024-07-03 ENCOUNTER — Inpatient Hospital Stay (HOSPITAL_COMMUNITY)

## 2024-07-03 DIAGNOSIS — R1312 Dysphagia, oropharyngeal phase: Secondary | ICD-10-CM | POA: Diagnosis not present

## 2024-07-03 DIAGNOSIS — J690 Pneumonitis due to inhalation of food and vomit: Secondary | ICD-10-CM

## 2024-07-03 DIAGNOSIS — K56609 Unspecified intestinal obstruction, unspecified as to partial versus complete obstruction: Secondary | ICD-10-CM | POA: Diagnosis not present

## 2024-07-03 DIAGNOSIS — R490 Dysphonia: Secondary | ICD-10-CM

## 2024-07-03 DIAGNOSIS — R5381 Other malaise: Secondary | ICD-10-CM | POA: Diagnosis not present

## 2024-07-03 LAB — CBC WITH DIFFERENTIAL/PLATELET
Abs Immature Granulocytes: 0.38 K/uL — ABNORMAL HIGH (ref 0.00–0.07)
Basophils Absolute: 0.1 K/uL (ref 0.0–0.1)
Basophils Relative: 0 %
Eosinophils Absolute: 0.2 K/uL (ref 0.0–0.5)
Eosinophils Relative: 1 %
HCT: 39.5 % (ref 39.0–52.0)
Hemoglobin: 13.7 g/dL (ref 13.0–17.0)
Immature Granulocytes: 3 %
Lymphocytes Relative: 13 %
Lymphs Abs: 1.7 K/uL (ref 0.7–4.0)
MCH: 31.1 pg (ref 26.0–34.0)
MCHC: 34.7 g/dL (ref 30.0–36.0)
MCV: 89.6 fL (ref 80.0–100.0)
Monocytes Absolute: 1 K/uL (ref 0.1–1.0)
Monocytes Relative: 8 %
Neutro Abs: 9.8 K/uL — ABNORMAL HIGH (ref 1.7–7.7)
Neutrophils Relative %: 75 %
Platelets: 205 K/uL (ref 150–400)
RBC: 4.41 MIL/uL (ref 4.22–5.81)
RDW: 13.6 % (ref 11.5–15.5)
WBC: 13.1 K/uL — ABNORMAL HIGH (ref 4.0–10.5)
nRBC: 0 % (ref 0.0–0.2)

## 2024-07-03 LAB — COMPREHENSIVE METABOLIC PANEL WITH GFR
ALT: 69 U/L — ABNORMAL HIGH (ref 0–44)
AST: 52 U/L — ABNORMAL HIGH (ref 15–41)
Albumin: 2.7 g/dL — ABNORMAL LOW (ref 3.5–5.0)
Alkaline Phosphatase: 52 U/L (ref 38–126)
Anion gap: 7 (ref 5–15)
BUN: 16 mg/dL (ref 8–23)
CO2: 25 mmol/L (ref 22–32)
Calcium: 7.8 mg/dL — ABNORMAL LOW (ref 8.9–10.3)
Chloride: 105 mmol/L (ref 98–111)
Creatinine, Ser: 0.83 mg/dL (ref 0.61–1.24)
GFR, Estimated: >60 mL/min
Glucose, Bld: 87 mg/dL (ref 70–99)
Potassium: 4 mmol/L (ref 3.5–5.1)
Sodium: 137 mmol/L (ref 135–145)
Total Bilirubin: 0.6 mg/dL (ref 0.0–1.2)
Total Protein: 5.2 g/dL — ABNORMAL LOW (ref 6.5–8.1)

## 2024-07-03 LAB — MAGNESIUM: Magnesium: 2 mg/dL (ref 1.7–2.4)

## 2024-07-03 LAB — PHOSPHORUS: Phosphorus: 2.2 mg/dL — ABNORMAL LOW (ref 2.5–4.6)

## 2024-07-03 MED ORDER — IPRATROPIUM-ALBUTEROL 0.5-2.5 (3) MG/3ML IN SOLN
3.0000 mL | Freq: Four times a day (QID) | RESPIRATORY_TRACT | Status: DC | PRN
  Administered 2024-07-03: 3 mL via RESPIRATORY_TRACT
  Filled 2024-07-03: qty 3

## 2024-07-03 MED ORDER — K PHOS MONO-SOD PHOS DI & MONO 155-852-130 MG PO TABS
500.0000 mg | ORAL_TABLET | Freq: Two times a day (BID) | ORAL | Status: AC
  Administered 2024-07-03 (×2): 500 mg via ORAL
  Filled 2024-07-03 (×2): qty 2

## 2024-07-03 MED ORDER — CLOPIDOGREL BISULFATE 75 MG PO TABS
75.0000 mg | ORAL_TABLET | Freq: Every day | ORAL | Status: DC
  Administered 2024-07-03 – 2024-07-05 (×3): 75 mg via ORAL
  Filled 2024-07-03 (×3): qty 1

## 2024-07-03 MED ORDER — ENOXAPARIN SODIUM 40 MG/0.4ML IJ SOSY
40.0000 mg | PREFILLED_SYRINGE | INTRAMUSCULAR | Status: DC
  Administered 2024-07-03 – 2024-07-05 (×3): 40 mg via SUBCUTANEOUS
  Filled 2024-07-03 (×3): qty 0.4

## 2024-07-03 NOTE — Progress Notes (Signed)
 Modified Barium Swallow Study  Patient Details  Name: XYLAN SHEILS MRN: [id number] Date of Birth: 31-Oct-1945  Today's Date: 07/03/2024  Modified Barium Swallow completed.  Full report located under Chart Review in the Imaging Section.  History of Present Illness Rebecca Crihfield 79 yo male admitted from home 06/22/2024 with N/V and abdominal pain. Found to have SBO, s/p laprascopic surgery 1/9.  Pt with presumed aspiration event 1/7 with hypoxia. Made NPO for SBO and diet restarted post surgery.  WBC trending up since diet reinitiation.  CXR 1/12: Streaky atelectasis and or pneumonia in the left base with small pleural effusions, slightly increased. Platelike atelectasis in the right lower lung. No prior ST assessment. PMH includes HTN, HLD, CAD, PVCs, NSTEMI (2020), diverticulosis and abscess.   Clinical Impression Pt presents with overall functional oropharyngeal swallowing. At times, the bolus sits in his pharynx for extended periods of time before pt initiates a swallow but this only occurs when cued to do a bolus hold. When drinking spontaneously, the swallow is initiated at the laryngeal surface of the epiglottis. Epiglottic inversion and laryngeal elevation are complete with only transient penetration of thin liquids (PAS 2). Despite missing dentition and absent upper plate, mastication is thorough. He clears his oropharynx of all but trace residuals across all trials. The 13 mm barium tablet was given with thin liquids and was [facility]. Continue current diet with general aspiration precautions and no ongoing SLP f/u. Will sign off at this time.  Factors that may increase risk of adverse event in presence of aspiration [name]): Respiratory or GI disease  Swallow Evaluation Recommendations Recommendations: PO diet PO Diet Recommendation: Regular;Thin liquids (Level 0) Liquid Administration via: Cup;Straw Medication Administration: Whole meds with liquid Supervision: Patient  able to self-feed;Set-up assistance for safety Swallowing strategies  : Minimize environmental distractions;Slow rate;Small bites/sips Postural changes: Position pt fully upright for meals Oral care recommendations: Oral care BID (2x/day)     Damien Blumenthal, M.A., CCC-SLP Speech Language Pathology, Acute Rehabilitation Services  Secure Chat preferred [phone]  07/03/2024,2:07 PM

## 2024-07-03 NOTE — Consult Note (Signed)
 WOC Nurse Consult Note: Reason for Consult: sacral wound  Wound type: Deep Tissue Pressure Injury Pressure Injury POA: No Measurement: see nursing flow sheets Wound bed: dark purple; non blanchable tissue; intact skin  Drainage (amount, consistency, odor) see nursing flowsheets Periwound: intact  Dressing procedure/placement/frequency: Cleanse left buttock wound with saline, pat dry Apply single layer of xeroform Soila # [phone])  to the wound and top with silicone foam. Change every other day and PRN soilage Turn and reposition per hospital policy Order chair pressure redistribution pad for patient Soila # [phone]) for use when up in chair.   WOC Nurse team will follow with you and see patient within 10 days for wound assessments.  Please notify WOC nurses of any acute changes in the wounds or any new areas of concern Nao Linz [facility] MSN, RN,CWOCN, CNS, CWON-AP [phone]

## 2024-07-03 NOTE — Evaluation (Signed)
 Clinical/Bedside Swallow Evaluation Patient Details  Name: Franklin Ward MRN: [id number] Date of Birth: 04/08/1946  Today's Date: 07/03/2024 Time: SLP Start Time (ACUTE ONLY): 1037 SLP Stop Time (ACUTE ONLY): 1051 SLP Time Calculation (min) (ACUTE ONLY): 14 min  Past Medical History:  Past Medical History:  Diagnosis Date   Coronary artery disease    Diverticulitis    NSTEMI (non-ST elevated myocardial infarction) (HCC) 08/09/2018   Past Surgical History:  Past Surgical History:  Procedure Laterality Date   CORONARY STENT INTERVENTION  08/10/2018   CORONARY STENT INTERVENTION N/A 08/10/2018   Procedure: CORONARY STENT INTERVENTION;  Surgeon: Jordan, Peter M, MD;  Location: [facility] INVASIVE CV LAB;  Service: Cardiovascular;  Laterality: N/A;   LAPAROSCOPIC LYSIS OF ADHESIONS  06/29/2024   Procedure: LYSIS, ADHESIONS, LAPAROSCOPIC;  Surgeon: Rubin Calamity, MD;  Location: [facility] OR;  Service: General;;   LAPAROSCOPY  06/29/2024   Procedure: LAPAROSCOPY, DIAGNOSTIC;  Surgeon: Rubin Calamity, MD;  Location: [facility] OR;  Service: General;;   LEFT HEART CATH AND CORONARY ANGIOGRAPHY N/A 08/10/2018   Procedure: LEFT HEART CATH AND CORONARY ANGIOGRAPHY;  Surgeon: Jordan, Peter M, MD;  Location: [facility] INVASIVE CV LAB;  Service: Cardiovascular;  Laterality: N/A;   HPI:  Franklin Ward 79 yo male admitted from home 06/22/2024 with N/V and abdominal pain. Found to have SBO, s/p laprascopic surgery 1/9.  Pt with presumed aspiration event 1/7 with hypoxia. Made NPO for SBO and diet restarted post surgery.  WBC trending up since diet reinitiation.  CXR 1/12: Streaky atelectasis and or pneumonia in the left base with small pleural effusions, slightly increased. Platelike atelectasis in the right lower lung. No prior ST assessment. PMH includes HTN, HLD, CAD, PVCs, NSTEMI (2020), diverticulosis and abscess.    Assessment / Plan / Recommendation  Clinical Impression  Pt presents with clinical indicators of pharyngeal  dysphagia.  Pt with baseline dysphonia with hoarse, possibly wet voice.  Vocal quality remained fairly consistent during evaluation.  Pt reports odynophagia with solids especially.  He had large bore NG in and out several times this admission with SBO.  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 Upper plate is not present, but pt does not consistently wear with PO intake. He had concerns about regular solid cracker which was not trialed, but exhibited good oral clearance with soft solids.  Pt with recent presumed aspiration event 1/7 with hypoxia and new onset supplemental O2 requirements 5L.  SLP was not able to evaluate prior to surgery given SBO. Hospitalist notes WBC trending upward after pt resumed PO diet. Recommend instrumental swallow study for further assessment of pharyngeal swallow function. Pt is not a good FEES candidate at this time 2/2 as he complains of nasopharyngeal discomfort following repeated NG insertion and removal.  Will plan for MBSS later this date. Pt may continue current diet pending results of instrumental.    Recommend regular texture diet with thin liquids with pt choosing softer foods as needed.   SLP Visit Diagnosis: Dysphagia, unspecified (R13.10)    Aspiration Risk  Moderate aspiration risk    Diet Recommendation Regular;Thin liquid (pending MBS)    Liquid Administration via: Cup;Straw Medication Administration:  (as tolerated) Supervision: Patient able to self feed Compensations: Slow rate;Small sips/bites Postural Changes: Seated upright at 90 degrees    Other Recommendations   N/A    Swallow Evaluation Recommendations  TBD   Assistance Recommended at Discharge  N/A  Functional Status Assessment  (TBD)  Frequency and Duration  (TBD)  Prognosis Prognosis for improved oropharyngeal function:  (TBD)      Swallow Study   General HPI: Franklin Ward 79 yo male admitted from home 06/22/2024 with N/V and abdominal pain. Found to  have SBO, s/p laprascopic surgery 1/9.  Pt with presumed aspiration event 1/7 with hypoxia. Made NPO for SBO and diet restarted post surgery.  WBC trending up since diet reinitiation.  CXR 1/12: Streaky atelectasis and or pneumonia in the left base with small pleural effusions, slightly increased. Platelike atelectasis in the right lower lung. No prior ST assessment. PMH includes HTN, HLD, CAD, PVCs, NSTEMI (2020), diverticulosis and abscess. Type of Study: Bedside Swallow Evaluation Previous Swallow Assessment: none Diet Prior to this Study: Regular;Thin liquids (Level 0) Temperature Spikes Noted: No Respiratory Status: Nasal cannula History of Recent Intubation: Yes Total duration of intubation (days):  (for procedure 1/9) Behavior/Cognition: Alert;Cooperative;Pleasant mood Oral Cavity Assessment: Within Functional Limits Oral Care Completed by SLP: Yes Oral Cavity - Dentition: Adequate natural dentition Vision: Functional for self-feeding Self-Feeding Abilities: Able to feed self Patient Positioning: Upright in bed Baseline Vocal Quality: Hoarse Volitional Cough: Weak Volitional Swallow: Able to elicit    Oral/Motor/Sensory Function Overall Oral Motor/Sensory Function: Within functional limits   Ice Chips Ice chips: Not tested   Thin Liquid Thin Liquid: Within functional limits    Nectar Thick Nectar Thick Liquid: Not tested   Honey Thick Honey Thick Liquid: Not tested   Puree Puree: Within functional limits   Solid     Solid: Impaired Oral Phase Impairments: Impaired mastication Oral Phase Functional Implications: Oral residue      Anette FORBES Grippe, MA, CCC-SLP Acute Rehabilitation Services Office: [phone] 07/03/2024,11:26 AM

## 2024-07-03 NOTE — Plan of Care (Signed)

## 2024-07-03 NOTE — Progress Notes (Addendum)
 " PROGRESS NOTE    Franklin Ward  FMW:[id number] DOB: 18-Dec-1945 DOA: 06/22/2024 PCP: Arloa Elsie JONELLE, MD   Brief Narrative:  Franklin Ward is a 79 y.o. male with medical history significant of hypertension, hyperlipidemia, CAD status post stent, PVCs, diverticulosis with history of diverticulitis and abscess presented with nausea, vomiting, abdominal pain. CT abdomen pelvis showed severe proximal small bowel dilation with transition zone consistent with SBO.  Diverticulosis without diverticulitis.    General surgery consulted. Had a prolonged hospital course with multiple NGT placements with brief transient episodes of getting better but Given lack of further improvement with medical management and clinical worsening was taken for Diagnostic Laparoscopy and LOA. In the interim he aspirated and had Acute Respiratory Failure and has been placed on IV Unasyn  and is getting a MBSS on 07/03/24.   He is now POD 4 Now on CLD -> SOFT. PT/OT recommending CIR and awaiting authorization. WBC continued to worsen but now finally improving.   Assessment and Plan:  SBO: Prolonged course and is S/p multiple NG placements with intermittent aspiration as below. He would get better and start improving but then clinically worsen. Given no Flatus on the AM of 06/30/23 Surgery has decided for surgical intervention; He is POD 4 of a diagnostic laparoscopy and lysis of adhesions.  Surgery has now removed his NG Tube and advanced his diet. He is now on a SOFT diet and tolerating however getting SLP to evaluate given his continued Cough with food intake.  -PT/OT recommending CIR and he appears to be a potential candidate given his weakness and Auth has been initiated.     Acute Respiratory Failure w/ Hypoxia secondary to Aspiration Pneumonia, not POA: On late afternoon of 06/27/2024, while working with PT patient was noted to have likely aspiration, became hypoxic with oxygen saturation of 85% requiring 5 L of oxygen.   -Initial X-ray was obtained which shows bibasilar airspace opacities which could be pneumonia or atelectasis.  CBC also showed leukocytosis.  Patient was kept n.p.o. briefly, started on Unasyn  for concern of aspiration pneumonia. WBC current trend: Recent Labs  Lab 06/23/24 0846 06/28/24 1052 06/29/24 1305 06/30/24 1009 07/01/24 0543 07/01/24 1646 07/02/24 1019  WBC 6.7 10.9* 11.0* 11.8* 15.4* 18.0* 16.6*  -SLP to evaluate and treat and plan is for MBSS @ 12:30 this Afternoon -Add DuoNebs q6h, Guaifenesin  1200 mg po BID, Flutter Valve and Incentive Spirometry  -Repeat CXR showed Streaky atelectasis and or pneumonia in the left base with small pleural effusions, slightly increased as well as Platelike atelectasis in the RLL.. -Will need an Ambulatory Home O2 screen prior to D/C   Essential Hypertension: Continue home dose of metoprolol  as well as needed IV Hydralazine .  Blood pressure controlled. CTM BP per Protocol. Last BP reading was 117/58  Hypophosphatemia: Phos Level is now 2.2. Replete w/ po K Phos  Neutral 500 mg po BID. CTM and Replete as Necessary. Repeat CMP in the AM   Hyperlipidemia: Continue home Ezetimibe  10 mg po Daily  Abnormal LFTs: Mild and ? Reactive vs Medication induced. AST is now 46 -> 52. ALT went from 44 -> 69.  CTM and Trend and Repeat CMP in the AM and if continues worsen or not improving will need further W/U w/ Acute Hepatitis Panel and RUQ U/S  Metabolic Acidosis: Mild and improved. CO2 is now 25, AG is 7, and Chloride Level is 105. CTM and Trend and Repeat CMP in the AM   CAD Status post stenting:  Continue Ezetimibe  10 mg po Daily and Metoprolol  Tartrate 50 mg po BID. Held Clopidogrel  75 mg given Surgery as above and will 07/03/24 resume as it is ok with Surgery    PVCs: History of symptomatic PVCs. Continue Metoprolol  Tartrate 50 mg po BID and Amiodarone  200 mg po M-F and CTM on Telemetry  Sacral Ulcer: WOCN consult.  Wound 07/01/24 1553 Pressure  Injury Buttocks Left;Medial Deep Tissue Pressure Injury - Purple or maroon localized area of discolored intact skin or blood-filled blister due to damage of underlying soft tissue from pressure and/or shear. (Active)   Hypoalbuminemia: Patient's Albumin  Lvl went from 4.4 -> 3.8 -> 3.0 -> 2.6 x2 -> 2.7. CTM & Trend & Repeat CMP in the AM    DVT prophylaxis: enoxaparin  (LOVENOX ) injection 40 mg Start: 07/03/24 0930 SCDs Start: 06/22/24 1406    Code Status: Full Code Family Communication: No family present @ bedside   Disposition Plan:  Level of care: Telemetry Status is: Inpatient Remains inpatient appropriate because: Needs CIR and Insurance Auth   Consultants:  CIR General Surgery  Procedures:  Diagnostic Lap and Exploratory Lap w/ LOA by Dr. Rubin on 06/29/24  Antimicrobials:  Anti-infectives (From admission, onward)    Start     Dose/Rate Route Frequency Ordered Stop   06/28/24 1200  Ampicillin -Sulbactam (UNASYN ) 3 g in sodium chloride  0.9 % 100 mL IVPB        3 g 200 mL/hr over 30 Minutes Intravenous Every 6 hours 06/28/24 1150 07/05/24 1159       Subjective: Seen and examined at bedside he was sitting in the chair and feels okay denies any ab and having difficulty time dominal pain.  Continues to be intermittently short of breath and continues to cough swallowing and states that it feels her throat is like sandpaper and very raw.  States that he has some difficult time chewing as well.  No lightheadedness or dizziness.  Thinks he is little bit stronger today compared to yesterday  Objective: Vitals:   07/03/24 0753 07/03/24 0833 07/03/24 0834 07/03/24 1100  BP: (!) 144/64 (!) 144/64  (!) 117/58  Pulse: 62 62  74  Resp: 18     Temp: 98 F (36.7 C)   98.1 F (36.7 C)  TempSrc: Oral   Oral  SpO2: 92%  94% 96%  Weight:      Height:        Intake/Output Summary (Last 24 hours) at 07/03/2024 1417 Last data filed at 07/03/2024 9146 Gross per 24 hour  Intake 206.27 ml   Output 1350 ml  Net -1143.73 ml   Filed Weights   06/22/24 0855  Weight: 77.8 kg   Examination: Physical Exam:  Constitutional: WN/WD elderly overweight Caucasian male in no acute distress appears a little fatigued Respiratory: Diminished to auscultation bilaterally with some coarse breath sounds, no wheezing, rales, rhonchi or crackles. Normal respiratory effort and patient is not tachypenic.  Not wearing any supplemental oxygen nasal cannula Cardiovascular: RRR, no murmurs / rubs / gallops. S1 and S2 auscultated.  Mild extremity edema Abdomen: Soft, non-tender, slightly distended secondary to. Bowel sounds positive.  GU: Deferred. Musculoskeletal: No clubbing / cyanosis of digits/nails. No joint deformity upper and lower extremities.  Skin: No rashes, lesions, ulcers on limited skin evaluation. No induration; Warm and dry.  Neurologic: CN 2-12 grossly intact with no focal deficits. Romberg sign and cerebellar reflexes not assessed.  Psychiatric: Awake and alert and appears calm  Data Reviewed: I have personally reviewed  following labs and imaging studies  CBC: Recent Labs  Lab 06/30/24 1009 07/01/24 0543 07/01/24 1646 07/02/24 1019 07/03/24 0512  WBC 11.8* 15.4* 18.0* 16.6* 13.1*  NEUTROABS 9.8* 12.6* 14.2* 13.9* 9.8*  HGB 14.9 13.6 15.0 14.2 13.7  HCT 43.7 40.7 45.0 41.6 39.5  MCV 93.4 92.9 92.8 91.6 89.6  PLT 200 198 198 202 205   Basic Metabolic Panel: Recent Labs  Lab 06/29/24 1305 06/30/24 1009 07/01/24 0543 07/02/24 1019 07/03/24 0512  NA 139 140 137 137 137  K 5.0 4.4 4.4 4.0 4.0  CL 108 109 107 106 105  CO2 12* 19* 21* 22 25  GLUCOSE 86 93 96 109* 87  BUN 44* 39* 32* 21 16  CREATININE 1.12 0.93 0.80 0.95 0.83  CALCIUM  8.6* 8.5* 7.8* 8.0* 7.8*  MG  --  2.3 2.0 1.8 2.0  PHOS  --  2.9 1.8* 2.0* 2.2*   GFR: Estimated Creatinine Clearance: 69.8 mL/min (by C-G formula based on SCr of 0.83 mg/dL). Liver Function Tests: Recent Labs  Lab 06/30/24 1009  07/01/24 0543 07/02/24 1019 07/03/24 0512  AST 23 28 46* 52*  ALT 20 18 44 69*  ALKPHOS 50 47 51 52  BILITOT 0.4 0.4 0.4 0.6  PROT 6.1* 5.2* 5.2* 5.2*  ALBUMIN  3.0* 2.6* 2.6* 2.7*   No results for input(s): LIPASE, AMYLASE in the last 168 hours. No results for input(s): AMMONIA in the last 168 hours. Coagulation Profile: No results for input(s): INR, PROTIME in the last 168 hours. Cardiac Enzymes: No results for input(s): CKTOTAL, CKMB, CKMBINDEX, TROPONINI in the last 168 hours. BNP (last 3 results) No results for input(s): PROBNP in the last 8760 hours. HbA1C: No results for input(s): HGBA1C in the last 72 hours. CBG: No results for input(s): GLUCAP in the last 168 hours. Lipid Profile: No results for input(s): CHOL, HDL, LDLCALC, TRIG, CHOLHDL, LDLDIRECT in the last 72 hours. Thyroid  Function Tests: No results for input(s): TSH, T4TOTAL, FREET4, T3FREE, THYROIDAB in the last 72 hours. Anemia Panel: No results for input(s): VITAMINB12, FOLATE, FERRITIN, TIBC, IRON, RETICCTPCT in the last 72 hours. Sepsis Labs: No results for input(s): PROCALCITON, LATICACIDVEN in the last 168 hours.  No results found for this or any previous visit (from the past 240 hours).   Radiology Studies: DG Swallowing Func-Speech Pathology Result Date: 07/03/2024 Table formatting from the original result was not included. Modified Barium Swallow Study Patient Details Name: Franklin Ward MRN: [id number] Date of Birth: 15-Mar-1946 Today's Date: 07/03/2024 HPI/PMH: HPI: Pearce Alabi 79 yo male admitted from home 06/22/2024 with N/V and abdominal pain. Found to have SBO, s/p laprascopic surgery 1/9.  Pt with presumed aspiration event 1/7 with hypoxia. Made NPO for SBO and diet restarted post surgery.  WBC trending up since diet reinitiation.  CXR 1/12: Streaky atelectasis and or pneumonia in the left base with small pleural effusions, slightly  increased. Platelike atelectasis in the right lower lung. No prior ST assessment. PMH includes HTN, HLD, CAD, PVCs, NSTEMI (2020), diverticulosis and abscess. Clinical Impression: Pt presents with overall functional oropharyngeal swallowing. At times, the bolus sits in his pharynx for extended periods of time before pt initiates a swallow but this only occurs when cued to do a bolus hold. When drinking spontaneously, the swallow is initiated at the laryngeal surface of the epiglottis. Epiglottic inversion and laryngeal elevation are complete with only transient penetration of thin liquids (PAS 2). Despite missing dentition and absent upper plate, mastication is thorough. He clears  his oropharynx of all but trace residuals across all trials. The 13 mm barium tablet was given with thin liquids and was [facility]. Continue current diet with general aspiration precautions and no ongoing SLP f/u. Will sign off at this time. Factors that may increase risk of adverse event in presence of aspiration [name]): Factors that may increase risk of adverse event in presence of aspiration [name]): Respiratory or GI disease Recommendations/Plan: Swallowing Evaluation Recommendations Swallowing Evaluation Recommendations Recommendations: PO diet PO Diet Recommendation: Regular; Thin liquids (Level 0) Liquid Administration via: Cup; Straw Medication Administration: Whole meds with liquid Supervision: Patient able to self-feed; Set-up assistance for safety Swallowing strategies  : Minimize environmental distractions; Slow rate; Small bites/sips Postural changes: Position pt fully upright for meals Oral care recommendations: Oral care BID (2x/day) Treatment Plan Treatment Plan Treatment recommendations: No treatment recommended at this time Follow-up recommendations: No SLP follow up Functional status assessment: Patient has not had a recent decline in their functional status. Recommendations Recommendations  for follow up therapy are one component of a multi-disciplinary discharge planning process, led by the attending physician.  Recommendations may be updated based on patient status, additional functional criteria and insurance authorization. Assessment: Orofacial Exam: Orofacial Exam Oral Cavity: Oral Hygiene: Xerostomia Oral Cavity - Dentition: Dentures, top; Dentures, not available; Missing dentition Orofacial Anatomy: WFL Oral Motor/Sensory Function: WFL Anatomy: Anatomy: WFL Boluses Administered: Boluses Administered Boluses Administered: Thin liquids (Level 0); Mildly thick liquids (Level 2, nectar thick); Moderately thick liquids (Level 3, honey thick); Puree; Solid  Oral Impairment Domain: Oral Impairment Domain Lip Closure: No labial escape Tongue control during bolus hold: Cohesive bolus between tongue to palatal seal Bolus preparation/mastication: Timely and efficient chewing and mashing Bolus transport/lingual motion: Brisk tongue motion Oral residue: Trace residue lining oral structures Location of oral residue : Tongue; Palate Initiation of pharyngeal swallow : Posterior laryngeal surface of the epiglottis  Pharyngeal Impairment Domain: Pharyngeal Impairment Domain Soft palate elevation: No bolus between soft palate (SP)/pharyngeal wall (PW) Laryngeal elevation: Complete superior movement of thyroid  cartilage with complete approximation of arytenoids to epiglottic petiole Anterior hyoid excursion: Complete anterior movement Epiglottic movement: Complete inversion Laryngeal vestibule closure: Complete, no air/contrast in laryngeal vestibule Pharyngeal stripping wave : Present - complete Pharyngeal contraction (A/P view only): N/A Pharyngoesophageal segment opening: Complete distension and complete duration, no obstruction of flow Tongue base retraction: Trace column of contrast or air between tongue base and PPW Pharyngeal residue: Trace residue within or on pharyngeal structures Location of pharyngeal  residue: Valleculae; Pyriform sinuses  Esophageal Impairment Domain: Esophageal Impairment Domain Esophageal clearance upright position: Complete clearance, esophageal coating Pill: Pill Consistency administered: Thin liquids (Level 0) Thin liquids (Level 0): [facility] Penetration/Aspiration Scale Score: Penetration/Aspiration Scale Score 1.  Material does not enter airway: Mildly thick liquids (Level 2, nectar thick); Moderately thick liquids (Level 3, honey thick); Puree; Solid; Pill 2.  Material enters airway, remains ABOVE vocal cords then ejected out: Thin liquids (Level 0) Compensatory Strategies: Compensatory Strategies Compensatory strategies: No   General Information: Caregiver present: No  Diet Prior to this Study: Regular; Thin liquids (Level 0)   Temperature : Normal   Respiratory Status: WFL   Supplemental O2: None (Room air)   History of Recent Intubation: No  Behavior/Cognition: Alert; Cooperative; Pleasant mood Self-Feeding Abilities: Able to self-feed Baseline vocal quality/speech: Dysphonic Volitional Cough: Able to elicit Volitional Swallow: Able to elicit Exam Limitations: No limitations Goal Planning: Prognosis for improved oropharyngeal function: Good No data  recorded No data recorded Patient/Family Stated Goal: not stated Consulted and agree with results and recommendations: Patient; Physician Pain: Pain Assessment Pain Assessment: Faces Faces Pain Scale: 0 Pain Location: L glute near cleft Pain Descriptors / Indicators: Sore; Tender Pain Intervention(s): Monitored during session End of Session: Start Time:SLP Start Time (ACUTE ONLY): 1224 Stop Time: SLP Stop Time (ACUTE ONLY): 1245 Time Calculation:SLP Time Calculation (min) (ACUTE ONLY): 21 min Charges: SLP Evaluations $ SLP Speech Visit: 1 Visit SLP Evaluations $BSS Swallow: 1 Procedure $MBS Swallow: 1 Procedure SLP visit diagnosis: SLP Visit Diagnosis: Dysphagia, oropharyngeal phase (R13.12) Past Medical History: Past Medical History: Diagnosis  Date  Coronary artery disease   Diverticulitis   NSTEMI (non-ST elevated myocardial infarction) (HCC) 08/09/2018 Past Surgical History: Past Surgical History: Procedure Laterality Date  CORONARY STENT INTERVENTION  08/10/2018  CORONARY STENT INTERVENTION N/A 08/10/2018  Procedure: CORONARY STENT INTERVENTION;  Surgeon: Jordan, Peter M, MD;  Location: [facility] INVASIVE CV LAB;  Service: Cardiovascular;  Laterality: N/A;  LAPAROSCOPIC LYSIS OF ADHESIONS  06/29/2024  Procedure: LYSIS, ADHESIONS, LAPAROSCOPIC;  Surgeon: Rubin Calamity, MD;  Location: [facility] OR;  Service: General;;  LAPAROSCOPY  06/29/2024  Procedure: LAPAROSCOPY, DIAGNOSTIC;  Surgeon: Rubin Calamity, MD;  Location: [facility] OR;  Service: General;;  LEFT HEART CATH AND CORONARY ANGIOGRAPHY N/A 08/10/2018  Procedure: LEFT HEART CATH AND CORONARY ANGIOGRAPHY;  Surgeon: Jordan, Peter M, MD;  Location: [facility] INVASIVE CV LAB;  Service: Cardiovascular;  Laterality: N/A; Damien Blumenthal, M.A., CCC-SLP Speech Language Pathology, Acute Rehabilitation Services Secure Chat preferred [phone] 07/03/2024, 2:14 PM  DG CHEST PORT 1 VIEW Result Date: 07/02/2024 CLINICAL DATA:  Atelectasis EXAM: PORTABLE CHEST 1 VIEW COMPARISON:  06/27/2024, 03/22/2024 FINDINGS: Platelike atelectasis in the right lower lung. Streaky atelectasis and or pneumonia in the left base with small pleural effusions. Stable cardiomediastinal silhouette with aortic atherosclerosis. IMPRESSION: Streaky atelectasis and or pneumonia in the left base with small pleural effusions, slightly increased. Platelike atelectasis in the right lower lung. Electronically Signed   By: Luke Bun M.D.   On: 07/02/2024 23:35   Scheduled Meds:  amiodarone   200 mg Oral Once per day on Monday Tuesday Wednesday Thursday Friday   clopidogrel   75 mg Oral Daily   cyanocobalamin   2,000 mcg Oral Daily   enoxaparin  (LOVENOX ) injection  40 mg Subcutaneous Q24H   ezetimibe   10 mg Oral Daily   feeding supplement  237 mL Oral BID BM    guaiFENesin   1,200 mg Oral BID   metoprolol  tartrate  50 mg Oral BID   phosphorus  500 mg Oral BID   sodium chloride  flush  3 mL Intravenous Q12H   Continuous Infusions:  ampicillin -sulbactam (UNASYN ) IV 3 g (07/03/24 1307)    LOS: 11 days   Alejandro Marker, DO [facility] Available via Epic secure chat 7am-7pm After these hours, please refer to coverage provider listed on [email or web address] 07/03/2024, 2:17 PM  "

## 2024-07-03 NOTE — Progress Notes (Signed)
" ° °  Inpatient Rehabilitation Admissions Coordinator   I met with patient at bedside. I await updated PT notes and Rehab consult completion to begin Auth today for possible CIR admit. PT is aware and Dr Babs to consult.  Heron Leavell, RN, MSN Rehab Admissions Coordinator [phone] 07/03/2024 10:17 AM  "

## 2024-07-03 NOTE — Progress Notes (Signed)
 "  Progress Note  4 Days Post-Op  Subjective: Tolerating HH diet. Denies pain, n/v. Having bowel function.  ROS  All negative with the exception of above.  Objective: Vital signs in last 24 hours: Temp:  [97.7 F (36.5 C)-98.1 F (36.7 C)] 98 F (36.7 C) (01/13 0753) Pulse Rate:  [62-73] 62 (01/13 0753) Resp:  [16-18] 18 (01/13 0753) BP: (136-154)/(63-81) 144/64 (01/13 0753) SpO2:  [92 %-98 %] 92 % (01/13 0753) Last BM Date : 07/02/24  Intake/Output from previous day: 01/12 0701 - 01/13 0700 In: 206.3 [IV Piggyback:206.3] Out: 1050 [Urine:1050] Intake/Output this shift: No intake/output data recorded.  PE: General: Pleasant male who is laying in bed eating breakfast in NAD. HEENT: Head is normocephalic, atraumatic.  Heart: HR normal during encounter.  Lungs: Respiratory effort nonlabored on room air. Abd: Soft with distention. NTTP. Incisions with dermabond C/D/I. No rebound tenderness or guarding.  MS: Able to move all 4 extremities.  Skin: Warm and dry.  Psych: A&Ox3 with an appropriate affect.   Lab Results:  Recent Labs    07/02/24 1019 07/03/24 0512  WBC 16.6* 13.1*  HGB 14.2 13.7  HCT 41.6 39.5  PLT 202 205   BMET Recent Labs    07/02/24 1019 07/03/24 0512  NA 137 137  K 4.0 4.0  CL 106 105  CO2 22 25  GLUCOSE 109* 87  BUN 21 16  CREATININE 0.95 0.83  CALCIUM  8.0* 7.8*   PT/INR No results for input(s): LABPROT, INR in the last 72 hours. CMP     Component Value Date/Time   NA 137 07/03/2024 0512   NA 137 03/22/2024 0955   K 4.0 07/03/2024 0512   CL 105 07/03/2024 0512   CO2 25 07/03/2024 0512   GLUCOSE 87 07/03/2024 0512   BUN 16 07/03/2024 0512   BUN 16 03/22/2024 0955   CREATININE 0.83 07/03/2024 0512   CALCIUM  7.8 (L) 07/03/2024 0512   PROT 5.2 (L) 07/03/2024 0512   PROT 7.2 03/22/2024 0955   ALBUMIN  2.7 (L) 07/03/2024 0512   ALBUMIN  4.6 03/22/2024 0955   AST 52 (H) 07/03/2024 0512   ALT 69 (H) 07/03/2024 0512   ALKPHOS  52 07/03/2024 0512   BILITOT 0.6 07/03/2024 0512   BILITOT 0.3 03/22/2024 0955   GFRNONAA >60 07/03/2024 0512   GFRAA 104 08/21/2018 1311   Lipase     Component Value Date/Time   LIPASE 24 06/22/2024 0553       Studies/Results: DG CHEST PORT 1 VIEW Result Date: 07/02/2024 CLINICAL DATA:  Atelectasis EXAM: PORTABLE CHEST 1 VIEW COMPARISON:  06/27/2024, 03/22/2024 FINDINGS: Platelike atelectasis in the right lower lung. Streaky atelectasis and or pneumonia in the left base with small pleural effusions. Stable cardiomediastinal silhouette with aortic atherosclerosis. IMPRESSION: Streaky atelectasis and or pneumonia in the left base with small pleural effusions, slightly increased. Platelike atelectasis in the right lower lung. Electronically Signed   By: Luke Bun M.D.   On: 07/02/2024 23:35    Anti-infectives: Anti-infectives (From admission, onward)    Start     Dose/Rate Route Frequency Ordered Stop   06/28/24 1200  Ampicillin -Sulbactam (UNASYN ) 3 g in sodium chloride  0.9 % 100 mL IVPB        3 g 200 mL/hr over 30 Minutes Intravenous Every 6 hours 06/28/24 1150          Assessment/Plan POD 4: S/P Diagnostic laparoscopy, lyse of adhesions by AR on 1/9 -Afebrile. -WBC 13.1 from 16.6 -Having bowel function. Tolerating  HH diet. No n/v, pain.  -Patient stable from general surgery standpoint and can discharge home when medically cleared. Outpatient follow is being arranged.    FEN: HH diet; IVF per primary team. VTE: Wound recommend pharm DVT prophylaxis while here. ID: [email or web address]  due to aspiration pneumonia.   LOS: 11 days   I reviewed specialist notes, nursing notes, hospitalist notes, last 24 h vitals and pain scores, last 48 h intake and output, last 24 h labs and trends, and last 24 h imaging results.   Marjorie Carlyon Favre, [facility] Surgery 07/03/2024, 8:15 AM Please see Amion for pager number during day hours 7:00am-4:30pm  "

## 2024-07-03 NOTE — Consult Note (Signed)
 "     Physical Medicine and Rehabilitation Consult Reason for Consult: Debility after prolonged hospital stay Referring Physician: Sherrill   HPI: Franklin Ward is a 79 y.o. male with a history of hypertension, CAD, diverticulosis and abscess who presented on 06/22/2024 with nausea vomiting and abdominal pain.  He was found to have a small bowel obstruction.  He ultimately underwent laparoscopy for lysis of adhesions on 06/29/2024.  Patient also with acute respiratory failure and hypoxia due to aspiration pneumonia.  He was made n.p.o. briefly and started on Unasyn .  Patient is now moving bowels.  He is tolerating a regular diet.  White cells have decreased to 13,000.  Patient lives at home with his spouse in a 1 level house with one-step to enter.  Patient was independent with mobility, occasionally using a single-point cane.  He does provide care for his spouse who has dementia.  Most recently with therapy has been min assist for sit to stand transfers and walk 120 feet min assist.    Home: Home Living Family/patient expects to be discharged to:: Private residence Living Arrangements: Spouse/significant other Available Help at Discharge: Family, Available 24 hours/day Type of Home: House Home Access: Stairs to enter Secretary/administrator of Steps: 1 Entrance Stairs-Rails: None Home Layout: One level Bathroom Shower/Tub: Psychologist, counselling, Engineer, Manufacturing Systems: Standard Bathroom Accessibility: Yes Home Equipment: Agricultural Consultant (2 wheels), [employer] - single point  Lives With: Spouse  Functional History: Prior Function Prior Level of Function : Independent/Modified Independent Mobility Comments: ambulatory with PRN use of SPC ADLs Comments: completed ADL, iADL and meal prep.  Cares for his spouse who has significant dementia.  Sounds like bedbound with transfers only. Functional Status:  Mobility: Bed Mobility Overal bed mobility: Needs Assistance Bed Mobility: Supine to  Sit Supine to sit: Min assist Sit to supine: Mod assist, Used rails General bed mobility comments: Min A to facilitate scoot to EOB. Pt returned to supine, required Mod A and BUE assist to pull body towards [facility] with bed positioned in [place] position. Transfers Overall transfer level: Needs assistance Equipment used: Rolling walker (2 wheels) Transfers: Sit to/from Stand, Bed to chair/wheelchair/BSC Sit to Stand: Min assist Bed to/from chair/wheelchair/BSC transfer type:: Step pivot Step pivot transfers: Contact guard assist, Min assist General transfer comment: Pt with two sit to stand transfers, initial stand with RW and three steps taken to the R towards HOB. Second transfer stand with RUE stabilized on bed rail and no DME utilized. Pt able to maintain standing balance with Min A for bed pad change. Ambulation/Gait Ambulation/Gait assistance: Min assist Gait Distance (Feet): 120 Feet Assistive device: Rolling walker (2 wheels) Gait Pattern/deviations: Step-through pattern, Decreased step length - right, Decreased step length - left, Decreased stride length, Trunk flexed General Gait Details: Pt with significant forward flexion and leaning onto walker. Frequent cues to stand more erect and stay closer to walker. Assist for balance and support as pt's control was impaired. Gait velocity: decr generally but at times speed picking up too much for pt to control Gait velocity interpretation: <1.8 ft/sec, indicate of risk for recurrent falls    ADL: ADL Overall ADL's : Needs assistance/impaired Eating/Feeding: NPO Grooming: Oral care, Set up, Sitting Upper Body Bathing: Set up, Sitting Lower Body Bathing: Moderate assistance, Sit to/from stand Upper Body Dressing : Set up, Sitting Lower Body Dressing: Moderate assistance Toilet Transfer: Contact guard assist, Rolling walker (2 wheels), Ambulation  Cognition: Cognition Orientation Level: Oriented X4 Cognition Arousal:  Alert  Behavior During Therapy: [facility] for tasks assessed/performed   Review of Systems  Constitutional:  Negative for fever.  HENT:  Positive for sore throat.   Respiratory:  Positive for cough and shortness of breath.   Cardiovascular: Negative.   Gastrointestinal: Negative.   Genitourinary: Negative.   Musculoskeletal:  Positive for joint pain.  Skin:  Negative for rash.  Neurological:  Positive for weakness.  Psychiatric/Behavioral:  The patient has insomnia.    Past Medical History:  Diagnosis Date   Coronary artery disease    Diverticulitis    NSTEMI (non-ST elevated myocardial infarction) (HCC) 08/09/2018   Past Surgical History:  Procedure Laterality Date   CORONARY STENT INTERVENTION  08/10/2018   CORONARY STENT INTERVENTION N/A 08/10/2018   Procedure: CORONARY STENT INTERVENTION;  Surgeon: Jordan, Peter M, MD;  Location: [facility] INVASIVE CV LAB;  Service: Cardiovascular;  Laterality: N/A;   LAPAROSCOPIC LYSIS OF ADHESIONS  06/29/2024   Procedure: LYSIS, ADHESIONS, LAPAROSCOPIC;  Surgeon: Rubin Calamity, MD;  Location: [facility] OR;  Service: General;;   LAPAROSCOPY  06/29/2024   Procedure: LAPAROSCOPY, DIAGNOSTIC;  Surgeon: Rubin Calamity, MD;  Location: [facility] OR;  Service: General;;   LEFT HEART CATH AND CORONARY ANGIOGRAPHY N/A 08/10/2018   Procedure: LEFT HEART CATH AND CORONARY ANGIOGRAPHY;  Surgeon: Jordan, Peter M, MD;  Location: [facility] INVASIVE CV LAB;  Service: Cardiovascular;  Laterality: N/A;   Family History  Problem Relation Age of Onset   Lung cancer Father    Colon cancer Neg Hx    Social History:  reports that he has quit smoking. He has quit using smokeless tobacco.  His smokeless tobacco use included chew. He reports that he does not drink alcohol  and does not use drugs. Allergies: Allergies[1] Facility-Administered Medications Prior to Admission  Medication Dose Route Frequency Provider Last Rate Last Admin   0.9 %  sodium chloride  infusion  500 mL Intravenous Continuous  Abran Norleen SAILOR, MD       Medications Prior to Admission  Medication Sig Dispense Refill   amiodarone  (PACERONE ) 200 MG tablet TAKE ONE TABLET BY MOUTH DAILY MONDAY - FRIDAY (DO NOT TAKE SATURDAY OR SUNDAY) 90 tablet 3   Ascorbic Acid (VITAMIN C PO) Take 1,000 mg by mouth daily.     Calcium  Carb-Cholecalciferol (CALCIUM  600 + D PO) Take 1 tablet by mouth daily.      carboxymethylcellulose (REFRESH PLUS) 0.5 % SOLN Place 1 drop into both eyes daily as needed (dry eyes).     cholecalciferol (VITAMIN D3) 25 MCG (1000 UT) tablet Take 5,000 Units by mouth daily.      clopidogrel  (PLAVIX ) 75 MG tablet Take 1 tablet (75 mg total) by mouth daily. 90 tablet 3   Cyanocobalamin  (B-12) 2500 MCG TABS Take 1 tablet by mouth daily.      ezetimibe  (ZETIA ) 10 MG tablet TAKE ONE TABLET BY MOUTH DAILY 90 tablet 3   metoprolol  tartrate (LOPRESSOR ) 50 MG tablet Take 1 tablet (50 mg total) by mouth 2 (two) times daily. 180 tablet 3   Misc Natural Products (OSTEO BI-FLEX ADV TRIPLE ST PO) Take 1 tablet by mouth 2 (two) times daily.      Multiple Vitamin (MULTIVITAMIN) tablet Take 1 tablet by mouth daily.     nitroGLYCERIN  (NITROSTAT ) 0.3 MG SL tablet Place 1 tablet (0.3 mg total) under the tongue every 5 (five) minutes as needed for chest pain. 30 tablet 3   Probiotic Product (PROBIOTIC BLEND PO) Take 1 capsule by mouth daily. Floragen  Turmeric 500 MG CAPS Take 500 mg by mouth daily.      zinc gluconate 50 MG tablet Take 50 mg by mouth daily.       Blood pressure (!) 144/64, pulse 62, temperature 98 F (36.7 C), temperature source Oral, resp. rate 18, height 5' 8 (1.727 m), weight 77.8 kg, SpO2 94%. Physical Exam Constitutional:      General: He is not in acute distress.    Appearance: He is ill-appearing.  HENT:     Head: Normocephalic.     Right Ear: External ear normal.     Left Ear: External ear normal.     Nose: Nose normal.     Mouth/Throat:     Mouth: Mucous membranes are moist.  Eyes:      Pupils: Pupils are equal, round, and reactive to light.  Cardiovascular:     Rate and Rhythm: Normal rate.  Pulmonary:     Effort: Pulmonary effort is normal.  Abdominal:     Palpations: Abdomen is soft.  Musculoskeletal:        General: No swelling. Normal range of motion.     Cervical back: Normal range of motion.  Skin:    General: Skin is warm.     Findings: Bruising present.  Neurological:     Mental Status: He is alert.     Comments: Alert and oriented x 3. Normal insight and awareness. Intact Memory. Normal language and speech. Speech dysphonic/hoarse. Cranial nerve exam unremarkable. MMT: 5/5 BUE, 4-/5 HF, KE and 4/5 ADF/PF. Sensory exam normal for light touch and pain in all 4 limbs. No limb ataxia or cerebellar signs. No abnormal tone appreciated.  SABRA    Psychiatric:        Mood and Affect: Mood normal.        Behavior: Behavior normal.     Results for orders placed or performed during the hospital encounter of 06/22/24 (from the past 24 hours)  CBC with Differential/Platelet     Status: Abnormal   Collection Time: 07/03/24  5:12 AM  Result Value Ref Range   WBC 13.1 (H) 4.0 - 10.5 K/uL   RBC 4.41 4.22 - 5.81 MIL/uL   Hemoglobin 13.7 13.0 - 17.0 g/dL   HCT 60.4 60.9 - 47.9 %   MCV 89.6 80.0 - 100.0 fL   MCH 31.1 26.0 - 34.0 pg   MCHC 34.7 30.0 - 36.0 g/dL   RDW 86.3 88.4 - 84.4 %   Platelets 205 150 - 400 K/uL   nRBC 0.0 0.0 - 0.2 %   Neutrophils Relative % 75 %   Neutro Abs 9.8 (H) 1.7 - 7.7 K/uL   Lymphocytes Relative 13 %   Lymphs Abs 1.7 0.7 - 4.0 K/uL   Monocytes Relative 8 %   Monocytes Absolute 1.0 0.1 - 1.0 K/uL   Eosinophils Relative 1 %   Eosinophils Absolute 0.2 0.0 - 0.5 K/uL   Basophils Relative 0 %   Basophils Absolute 0.1 0.0 - 0.1 K/uL   Immature Granulocytes 3 %   Abs Immature Granulocytes 0.38 (H) 0.00 - 0.07 K/uL  Comprehensive metabolic panel with GFR     Status: Abnormal   Collection Time: 07/03/24  5:12 AM  Result Value Ref Range    Sodium 137 135 - 145 mmol/L   Potassium 4.0 3.5 - 5.1 mmol/L   Chloride 105 98 - 111 mmol/L   CO2 25 22 - 32 mmol/L   Glucose, Bld 87 70 - 99 mg/dL  BUN 16 8 - 23 mg/dL   Creatinine, Ser 9.16 0.61 - 1.24 mg/dL   Calcium  7.8 (L) 8.9 - 10.3 mg/dL   Total Protein 5.2 (L) 6.5 - 8.1 g/dL   Albumin  2.7 (L) 3.5 - 5.0 g/dL   AST 52 (H) 15 - 41 U/L   ALT 69 (H) 0 - 44 U/L   Alkaline Phosphatase 52 38 - 126 U/L   Total Bilirubin 0.6 0.0 - 1.2 mg/dL   GFR, Estimated >39 >39 mL/min   Anion gap 7 5 - 15  Magnesium      Status: None   Collection Time: 07/03/24  5:12 AM  Result Value Ref Range   Magnesium  2.0 1.7 - 2.4 mg/dL  Phosphorus     Status: Abnormal   Collection Time: 07/03/24  5:12 AM  Result Value Ref Range   Phosphorus 2.2 (L) 2.5 - 4.6 mg/dL   DG CHEST PORT 1 VIEW Result Date: 07/02/2024 CLINICAL DATA:  Atelectasis EXAM: PORTABLE CHEST 1 VIEW COMPARISON:  06/27/2024, 03/22/2024 FINDINGS: Platelike atelectasis in the right lower lung. Streaky atelectasis and or pneumonia in the left base with small pleural effusions. Stable cardiomediastinal silhouette with aortic atherosclerosis. IMPRESSION: Streaky atelectasis and or pneumonia in the left base with small pleural effusions, slightly increased. Platelike atelectasis in the right lower lung. Electronically Signed   By: Luke Bun M.D.   On: 07/02/2024 23:35    Assessment/Plan: Diagnosis: 79 year old male with debility related to small bowel obstruction requiring lysis of adhesions as well as aspiration pneumonia, both leading to a prolonged hospital course Does the need for close, 24 hr/day medical supervision in concert with the patient's rehab needs make it unreasonable for this patient to be served in a less intensive setting? Yes Co-Morbidities requiring supervision/potential complications:  - Aspiration pneumonia -Hypertension -Nutrition and electrolytes -CAD -dysphagia, dysphonia Due to bladder management, bowel management,  safety, skin/wound care, disease management, medication administration, pain management, and patient education, does the patient require 24 hr/day rehab nursing? Yes Does the patient require coordinated care of a physician, rehab nurse, therapy disciplines of PT, OT, SLP to address physical and functional deficits in the context of the above medical diagnosis(es)? Yes Addressing deficits in the following areas: balance, endurance, locomotion, strength, transferring, bowel/bladder control, bathing, dressing, feeding, grooming, toileting, speech, swallowing, and psychosocial support Can the patient actively participate in an intensive therapy program of at least 3 hrs of therapy per day at least 5 days per week? Yes The potential for patient to make measurable gains while on inpatient rehab is excellent Anticipated functional outcomes upon discharge from inpatient rehab are modified independent and supervision  with PT, modified independent and supervision with OT, modified independent with SLP. Estimated rehab length of stay to reach the above functional goals is: 7-8 days Anticipated discharge destination: Home Overall Rehab/Functional Prognosis: excellent  POST ACUTE RECOMMENDATIONS: This patient's condition is appropriate for continued rehabilitative care in the following setting: CIR Patient has agreed to participate in recommended program. Yes Note that insurance prior authorization may be required for reimbursement for recommended care.  Comment: Pt needs to be essentially independent to return home. Family is helping his wife currently who has demenita. Rehab Admissions Coordinator to follow up.      I have personally performed a face to face diagnostic evaluation of this patient. Additionally, I have examined the patient's medical record including any pertinent labs and radiographic images.    Thanks,  Arthea ONEIDA Gunther, MD 07/03/2024     [  1]  Allergies Allergen Reactions   Bee  Venom Hives and Anaphylaxis   Erythromycin Rash   Tetracyclines & Related Rash   "

## 2024-07-03 NOTE — Progress Notes (Signed)
 Physical Therapy Treatment Patient Details Name: Franklin Ward MRN: [id number] DOB: 03-31-1946 Today's Date: 07/03/2024   History of Present Illness 79 y.o. male presents to [facility] hospital on 06/22/2024 with nausea, vomiting and abdominal pain. Imaging notable for SBO. Pt underwent laparascopy for lysis of adhesions on 06/29/24. PMH includes HTN, HLD, CAD, PVCs, diverticulosis and abscess.    PT Comments  Pt progressing towards physical therapy goals. Pt motivated to ambulate into the bathroom for BM. He required assist for hygiene after toileting, and noted difficulty maintaining static standing balance during this time. Heavy min assist required for balance as PT assisted with hygiene. Pt unable to stand unsupported to wash hands, and leaning onto forearms at sink to wash. Pt continues to demonstrate decreased tolerance for functional activity, however feel he has good rehab potential and would benefit from continued multidisciplinary rehab >3 hours/day at d/c to maximize functional independence, safety, and promote return to PLOF. Will continue to follow.     If plan is discharge home, recommend the following: Assistance with cooking/housework;Assist for transportation;Help with stairs or ramp for entrance;A little help with walking and/or transfers;A little help with bathing/dressing/bathroom   Can travel by private vehicle        Equipment Recommendations  BSC/3in1    Recommendations for Other Services Rehab consult     Precautions / Restrictions Precautions Precautions: Fall Recall of Precautions/Restrictions: Intact Restrictions Weight Bearing Restrictions Per Provider Order: No     Mobility  Bed Mobility Overal bed mobility: Needs Assistance Bed Mobility: Sit to Supine       Sit to supine: Mod assist, Used rails   General bed mobility comments: Assist for LE elevation back up into bed at end of session. Pt with increased pain to back returning to a flat surface, but reports  this is baseline. Increased time to reposition in bed.    Transfers Overall transfer level: Needs assistance   Transfers: Sit to/from Stand, Bed to chair/wheelchair/BSC Sit to Stand: Min assist, Contact guard assist   Step pivot transfers: Contact guard assist, Supervision       General transfer comment: CGA initially progressing to min A as pt fatigued. Pt with several sit<>stands throughout session.    Ambulation/Gait Ambulation/Gait assistance: Min assist Gait Distance (Feet): 30 Feet Assistive device: Rolling walker (2 wheels) Gait Pattern/deviations: Step-through pattern, Decreased step length - right, Decreased step length - left, Decreased stride length, Trunk flexed Gait velocity: Decreased Gait velocity interpretation: <1.31 ft/sec, indicative of household ambulator   General Gait Details: Pt with significant forward flexion due to baseline back pain. Pt able to make corrective changes for a short bout but reverts back to flexed posture as this is his position of comfort. Pt ambulated around room only as he was about to go down for a MBS.   Stairs             Wheelchair Mobility     Tilt Bed    Modified Rankin (Stroke Patients Only)       Balance Overall balance assessment: Needs assistance Sitting-balance support: No upper extremity supported, Feet supported Sitting balance-Leahy Scale: Good     Standing balance support: Reliant on assistive device for balance Standing balance-Leahy Scale: Fair Standing balance comment: Dependent on external support                            Communication Communication Communication: Impaired Factors Affecting Communication: Hearing impaired  Cognition Arousal: Alert  Behavior During Therapy: WFL for tasks assessed/performed   PT - Cognitive impairments: No apparent impairments                         Following commands: Intact      Cueing Cueing Techniques: Verbal cues  Exercises       General Comments        Pertinent Vitals/Pain Pain Assessment Pain Assessment: Faces Faces Pain Scale: Hurts little more Pain Location: L glute near cleft Pain Descriptors / Indicators: Sore, Tender Pain Intervention(s): Limited activity within patient's tolerance, Monitored during session, Repositioned    Home Living                          Prior Function            PT Goals (current goals can now be found in the care plan section) Acute Rehab PT Goals Patient Stated Goal: Regain independence Progress towards PT goals: Progressing toward goals    Frequency    Min 2X/week      PT Plan      Co-evaluation              AM-PAC PT 6 Clicks Mobility   Outcome Measure  Help needed turning from your back to your side while in a flat bed without using bedrails?: A Little Help needed moving from lying on your back to sitting on the side of a flat bed without using bedrails?: A Little Help needed moving to and from a bed to a chair (including a wheelchair)?: A Little Help needed standing up from a chair using your arms (e.g., wheelchair or bedside chair)?: A Little Help needed to walk in hospital room?: A Little Help needed climbing 3-5 steps with a railing? : Total 6 Click Score: 16    End of Session Equipment Utilized During Treatment: Gait belt Activity Tolerance: Patient tolerated treatment well Patient left: in bed;with call bell/phone within reach;Other (comment) (With transport) Nurse Communication: Mobility status PT Visit Diagnosis: Other abnormalities of gait and mobility (R26.89);Muscle weakness (generalized) (M62.81)     Time: 8799-8784 PT Time Calculation (min) (ACUTE ONLY): 15 min  Charges:    $Gait Training: 8-22 mins PT General Charges $$ ACUTE PT VISIT: 1 Visit                     Leita Sable, PT, DPT Acute Rehabilitation Services Secure Chat Preferred Office: [phone]    Leita JONETTA Sable 07/03/2024, 12:56  PM

## 2024-07-03 NOTE — Progress Notes (Signed)
 Occupational Therapy Treatment Patient Details Name: Franklin Ward MRN: [id number] DOB: June 08, 1946 Today's Date: 07/03/2024   History of present illness 79 y.o. male presents to [facility] hospital on 06/22/2024 with nausea, vomiting and abdominal pain. Imaging notable for SBO. Pt underwent laparascopy for lysis of adhesions on 06/29/24. PMH includes HTN, HLD, CAD, PVCs, diverticulosis and abscess.   OT comments  Patient able to progress to walking to bathroom for BM.  2WRW and Min A for short distance mobility.  Min a for bed mobility, assist to scoot hips to the edge of the bed.  Min A to donn shoes prior to mobility.  Patient fortunately has no expressed pain, and is motivated to regain his prior level of independence.  Continue to recommend a higher intensity rehab, Patient will benefit from intensive inpatient follow-up therapy, >3 hours/day.  As the patient is the primary caregiver for his spouse at home.  OT will continue efforts in the acute setting.        If plan is discharge home, recommend the following:  A lot of help with bathing/dressing/bathroom;A lot of help with walking and/or transfers   Equipment Recommendations  None recommended by OT    Recommendations for Other Services      Precautions / Restrictions Precautions Precautions: Fall Restrictions Weight Bearing Restrictions Per Provider Order: No       Mobility Bed Mobility Overal bed mobility: Needs Assistance Bed Mobility: Supine to Sit     Supine to sit: Min assist          Transfers Overall transfer level: Needs assistance Equipment used: Rolling walker (2 wheels) Transfers: Sit to/from Stand, Bed to chair/wheelchair/BSC Sit to Stand: Min assist     Step pivot transfers: Contact guard assist, Min assist           Balance Overall balance assessment: Needs assistance Sitting-balance support: No upper extremity supported, Feet supported Sitting balance-Leahy Scale: Good     Standing balance  support: Reliant on assistive device for balance Standing balance-Leahy Scale: Fair                             ADL either performed or assessed with clinical judgement   ADL                           Toilet Transfer: Contact guard assist;Rolling walker (2 wheels);Ambulation                  Extremity/Trunk Assessment Upper Extremity Assessment Upper Extremity Assessment: Overall WFL for tasks assessed   Lower Extremity Assessment Lower Extremity Assessment: Defer to PT evaluation   Cervical / Trunk Assessment Cervical / Trunk Assessment: Kyphotic    Vision Baseline Vision/History: 1 Wears glasses Patient Visual Report: No change from baseline     Perception Perception Perception: Not tested   Praxis Praxis Praxis: Not tested   Communication Communication Communication: No apparent difficulties Factors Affecting Communication: Hearing impaired   Cognition Arousal: Alert Behavior During Therapy: WFL for tasks assessed/performed Cognition: No apparent impairments                               Following commands: Intact        Cueing   Cueing Techniques: Verbal cues  Exercises      Shoulder Instructions       General Comments  Pertinent Vitals/ Pain       Pain Assessment Pain Assessment: No/denies pain Pain Intervention(s): Monitored during session                                                          Frequency  Min 2X/week        Progress Toward Goals  OT Goals(current goals can now be found in the care plan section)  Progress towards OT goals: Progressing toward goals  Acute Rehab OT Goals OT Goal Formulation: With patient Time For Goal Achievement: 07/12/24 Potential to Achieve Goals: Good  Plan      Co-evaluation                 AM-PAC OT 6 Clicks Daily Activity     Outcome Measure   Help from another person eating meals?: None Help from another  person taking care of personal grooming?: A Little Help from another person toileting, which includes using toliet, bedpan, or urinal?: A Lot Help from another person bathing (including washing, rinsing, drying)?: A Lot Help from another person to put on and taking off regular upper body clothing?: A Little Help from another person to put on and taking off regular lower body clothing?: A Lot 6 Click Score: 16    End of Session Equipment Utilized During Treatment: Rolling walker (2 wheels);Gait belt  OT Visit Diagnosis: Unsteadiness on feet (R26.81);Muscle weakness (generalized) (M62.81)   Activity Tolerance Patient tolerated treatment well   Patient Left in chair   Nurse Communication Mobility status        Time: 9159-9148 OT Time Calculation (min): 11 min  Charges: OT General Charges $OT Visit: 1 Visit OT Treatments $Therapeutic Activity: 8-22 mins  07/03/2024  RP, OTR/L  Acute Rehabilitation Services  Office:  [phone]   Charlie JONETTA Halsted 07/03/2024, 8:56 AM

## 2024-07-04 DIAGNOSIS — K56609 Unspecified intestinal obstruction, unspecified as to partial versus complete obstruction: Secondary | ICD-10-CM | POA: Diagnosis not present

## 2024-07-04 LAB — CBC WITH DIFFERENTIAL/PLATELET
Abs Immature Granulocytes: 0.44 K/uL — ABNORMAL HIGH (ref 0.00–0.07)
Basophils Absolute: 0.1 K/uL (ref 0.0–0.1)
Basophils Relative: 1 %
Eosinophils Absolute: 0.2 K/uL (ref 0.0–0.5)
Eosinophils Relative: 1 %
HCT: 39.9 % (ref 39.0–52.0)
Hemoglobin: 13.7 g/dL (ref 13.0–17.0)
Immature Granulocytes: 3 %
Lymphocytes Relative: 11 %
Lymphs Abs: 1.7 K/uL (ref 0.7–4.0)
MCH: 31.3 pg (ref 26.0–34.0)
MCHC: 34.3 g/dL (ref 30.0–36.0)
MCV: 91.1 fL (ref 80.0–100.0)
Monocytes Absolute: 1.3 K/uL — ABNORMAL HIGH (ref 0.1–1.0)
Monocytes Relative: 9 %
Neutro Abs: 11.3 K/uL — ABNORMAL HIGH (ref 1.7–7.7)
Neutrophils Relative %: 75 %
Platelets: 208 K/uL (ref 150–400)
RBC: 4.38 MIL/uL (ref 4.22–5.81)
RDW: 13.6 % (ref 11.5–15.5)
WBC: 15 K/uL — ABNORMAL HIGH (ref 4.0–10.5)
nRBC: 0 % (ref 0.0–0.2)

## 2024-07-04 LAB — COMPREHENSIVE METABOLIC PANEL WITH GFR
ALT: 95 U/L — ABNORMAL HIGH (ref 0–44)
AST: 58 U/L — ABNORMAL HIGH (ref 15–41)
Albumin: 2.6 g/dL — ABNORMAL LOW (ref 3.5–5.0)
Alkaline Phosphatase: 55 U/L (ref 38–126)
Anion gap: 9 (ref 5–15)
BUN: 14 mg/dL (ref 8–23)
CO2: 24 mmol/L (ref 22–32)
Calcium: 8.3 mg/dL — ABNORMAL LOW (ref 8.9–10.3)
Chloride: 104 mmol/L (ref 98–111)
Creatinine, Ser: 0.88 mg/dL (ref 0.61–1.24)
GFR, Estimated: >60 mL/min
Glucose, Bld: 94 mg/dL (ref 70–99)
Potassium: 4.4 mmol/L (ref 3.5–5.1)
Sodium: 137 mmol/L (ref 135–145)
Total Bilirubin: 0.4 mg/dL (ref 0.0–1.2)
Total Protein: 5.3 g/dL — ABNORMAL LOW (ref 6.5–8.1)

## 2024-07-04 LAB — PHOSPHORUS: Phosphorus: 2.5 mg/dL (ref 2.5–4.6)

## 2024-07-04 LAB — MAGNESIUM: Magnesium: 2 mg/dL (ref 1.7–2.4)

## 2024-07-04 MED ORDER — SIMETHICONE 80 MG PO CHEW
80.0000 mg | CHEWABLE_TABLET | Freq: Four times a day (QID) | ORAL | Status: AC
  Administered 2024-07-04 (×2): 80 mg via ORAL
  Filled 2024-07-04 (×3): qty 1

## 2024-07-04 NOTE — Progress Notes (Signed)
 [facility] PROGRESS NOTE    Progress Note  [name]  FMW:[id number] DOB: February 11, 1946 DOA: 06/22/2024 PCP: Arloa Elsie JONELLE, MD     Brief Narrative:   Franklin Ward is an 79 y.o. male past medical history significant for essential hypertension CAD status post stent PVC diverticulosis and diverticulitis with abscess comes into the hospital for abdominal pain CT scan of the abdomen pelvis shows severe proximal bowel dilation probable small bowel obstruction.  General surgery was consulted.  He had a prolonged hospital course with multiple NG tube placement and bravely improved.  But then decompensated so surgery opted for diagnostic laparoscopy there interim he aspirated and had respiratory failure probably due to aspiration start empirically on IV Unasyn . Now on clear liquid diet PT OT recommended inpatient rehab awaiting insurance authorization.   Assessment/Plan:   SBO (small bowel obstruction) (HCC) Prolonged course status post multiple NG tube placement. Surgery decided for surgical intervention on 06/30/2023.  With laparoscopic and lysis of adhesions. Currently tolerating Full liq. diet. PT evaluated the patient will need inpatient rehab. Awaiting insurance authorization.  Acute respiratory failure with hypoxia likely due to aspiration pneumonia: Developed hypoxia with leukocytosis. Will start empirically on IV Unasyn . Repeat a chest x-ray showed possible left lower lobe infiltrate. Ambulating check saturations with ambulation.  Benign essential HTN Continue metoprolol  and IV hydralazine  as needed. Blood pressures relatively well-controlled.  Hypophosphatemia: Replete try to keep phosphorus greater than 3.  Hyperlipidemia: Continue Setia.  Elevated LFTs:  Met acidosis: Resolved.  CAD status post stenting, Continue current medication Plavix  was held for surgery resume back yesterday.  PVCs: Asymptomatic continue amiodarone .  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:    DVT prophylaxis: lovenox  Family Communication:none Status is: Inpatient Remains inpatient appropriate because: SBO    Code Status:     Code Status Orders  (From admission, onward)           Start     Ordered   06/22/24 1406  Full code  Continuous       Question:  By:  Answer:  Consent: discussion documented in EHR   06/22/24 1414           Code Status History     Date Active Date Inactive Code Status Order ID Comments User Context   08/09/2018 1615 08/11/2018 1557 Full Code [id number]  Earley Saucer, MD ED   07/07/2016 0135 07/10/2016 1347 Full Code [id number]  Belinda Cough, MD Inpatient         IV Access:   Peripheral IV   Procedures and diagnostic studies:   DG Abd Portable 1V Result Date: 07/04/2024 EXAM: 1 VIEW XRAY OF THE ABDOMEN 07/03/2024 11:55:00 PM COMPARISON: 07/01/2024 CLINICAL HISTORY: The patient presents with abdominal distension, abdominal pain, and shortness of breath. ICD10 codes: 10323 - Abdominal distension, 644753 - Abdominal pain, 141880 - Shortness of breath. FINDINGS: BOWEL: Gaseous distension of the small bowel. PO contrast throughout the small bowel. SOFT TISSUES: No abnormal calcifications. BONES: No acute fracture. LUNG BASES: Atelectasis in lung bases. PLEURAL SPACES: Trace bilateral pleural effusions. IMPRESSION: 1. Gaseous distension of the small bowel consistent with possible ileus. 2. Bibasilar atelectasis and trace bilateral pleural effusions. Electronically signed by: Morgane Naveau MD 07/04/2024 12:08 AM EST RP Workstation: [id number]   DG CHEST PORT 1 VIEW Result Date: 07/04/2024 CLINICAL DATA:  Abdomen distension EXAM: PORTABLE CHEST 1 VIEW COMPARISON:  07/02/2024, 06/27/2024 FINDINGS: Persistent platelike atelectasis in the right lower lung. Streaky atelectasis or mild infiltrate left  lung base. Trace pleural effusions. Stable cardiomediastinal silhouette. No pneumothorax IMPRESSION: Persistent  platelike atelectasis in the right lower lung. Streaky atelectasis or mild infiltrate at the left lung base. Trace pleural effusions. Electronically Signed   By: Luke Bun M.D.   On: 07/04/2024 00:03   DG Swallowing Func-Speech Pathology Result Date: 07/03/2024 Table formatting from the original result was not included. Modified Barium Swallow Study Patient Details Name: Franklin Ward MRN: [id number] Date of Birth: 15-Jun-1946 Today's Date: 07/03/2024 HPI/PMH: HPI: Franklin Ward 79 yo male admitted from home 06/22/2024 with N/V and abdominal pain. Found to have SBO, s/p laprascopic surgery 1/9.  Pt with presumed aspiration event 1/7 with hypoxia. Made NPO for SBO and diet restarted post surgery.  WBC trending up since diet reinitiation.  CXR 1/12: Streaky atelectasis and or pneumonia in the left base with small pleural effusions, slightly increased. Platelike atelectasis in the right lower lung. No prior ST assessment. PMH includes HTN, HLD, CAD, PVCs, NSTEMI (2020), diverticulosis and abscess. Clinical Impression: Pt presents with overall functional oropharyngeal swallowing. At times, the bolus sits in his pharynx for extended periods of time before pt initiates a swallow but this only occurs when cued to do a bolus hold. When drinking spontaneously, the swallow is initiated at the laryngeal surface of the epiglottis. Epiglottic inversion and laryngeal elevation are complete with only transient penetration of thin liquids (PAS 2). Despite missing dentition and absent upper plate, mastication is thorough. He clears his oropharynx of all but trace residuals across all trials. The 13 mm barium tablet was given with thin liquids and was [facility]. Continue current diet with general aspiration precautions and no ongoing SLP f/u. Will sign off at this time. Factors that may increase risk of adverse event in presence of aspiration [name]): Factors that may increase risk of adverse event in presence of  aspiration [name]): Respiratory or GI disease Recommendations/Plan: Swallowing Evaluation Recommendations Swallowing Evaluation Recommendations Recommendations: PO diet PO Diet Recommendation: Regular; Thin liquids (Level 0) Liquid Administration via: Cup; Straw Medication Administration: Whole meds with liquid Supervision: Patient able to self-feed; Set-up assistance for safety Swallowing strategies  : Minimize environmental distractions; Slow rate; Small bites/sips Postural changes: Position pt fully upright for meals Oral care recommendations: Oral care BID (2x/day) Treatment Plan Treatment Plan Treatment recommendations: No treatment recommended at this time Follow-up recommendations: No SLP follow up Functional status assessment: Patient has not had a recent decline in their functional status. Recommendations Recommendations for follow up therapy are one component of a multi-disciplinary discharge planning process, led by the attending physician.  Recommendations may be updated based on patient status, additional functional criteria and insurance authorization. Assessment: Orofacial Exam: Orofacial Exam Oral Cavity: Oral Hygiene: Xerostomia Oral Cavity - Dentition: Dentures, top; Dentures, not available; Missing dentition Orofacial Anatomy: WFL Oral Motor/Sensory Function: WFL Anatomy: Anatomy: WFL Boluses Administered: Boluses Administered Boluses Administered: Thin liquids (Level 0); Mildly thick liquids (Level 2, nectar thick); Moderately thick liquids (Level 3, honey thick); Puree; Solid  Oral Impairment Domain: Oral Impairment Domain Lip Closure: No labial escape Tongue control during bolus hold: Cohesive bolus between tongue to palatal seal Bolus preparation/mastication: Timely and efficient chewing and mashing Bolus transport/lingual motion: Brisk tongue motion Oral residue: Trace residue lining oral structures Location of oral residue : Tongue; Palate Initiation of pharyngeal swallow :  Posterior laryngeal surface of the epiglottis  Pharyngeal Impairment Domain: Pharyngeal Impairment Domain Soft palate elevation: No bolus between soft palate (SP)/pharyngeal wall (  PW) Laryngeal elevation: Complete superior movement of thyroid  cartilage with complete approximation of arytenoids to epiglottic petiole Anterior hyoid excursion: Complete anterior movement Epiglottic movement: Complete inversion Laryngeal vestibule closure: Complete, no air/contrast in laryngeal vestibule Pharyngeal stripping wave : Present - complete Pharyngeal contraction (A/P view only): N/A Pharyngoesophageal segment opening: Complete distension and complete duration, no obstruction of flow Tongue base retraction: Trace column of contrast or air between tongue base and PPW Pharyngeal residue: Trace residue within or on pharyngeal structures Location of pharyngeal residue: Valleculae; Pyriform sinuses  Esophageal Impairment Domain: Esophageal Impairment Domain Esophageal clearance upright position: Complete clearance, esophageal coating Pill: Pill Consistency administered: Thin liquids (Level 0) Thin liquids (Level 0): [facility] Penetration/Aspiration Scale Score: Penetration/Aspiration Scale Score 1.  Material does not enter airway: Mildly thick liquids (Level 2, nectar thick); Moderately thick liquids (Level 3, honey thick); Puree; Solid; Pill 2.  Material enters airway, remains ABOVE vocal cords then ejected out: Thin liquids (Level 0) Compensatory Strategies: Compensatory Strategies Compensatory strategies: No   General Information: Caregiver present: No  Diet Prior to this Study: Regular; Thin liquids (Level 0)   Temperature : Normal   Respiratory Status: WFL   Supplemental O2: None (Room air)   History of Recent Intubation: No  Behavior/Cognition: Alert; Cooperative; Pleasant mood Self-Feeding Abilities: Able to self-feed Baseline vocal quality/speech: Dysphonic Volitional Cough: Able to elicit Volitional Swallow: Able to elicit Exam  Limitations: No limitations Goal Planning: Prognosis for improved oropharyngeal function: Good No data recorded No data recorded Patient/Family Stated Goal: not stated Consulted and agree with results and recommendations: Patient; Physician Pain: Pain Assessment Pain Assessment: Faces Faces Pain Scale: 0 Pain Location: L glute near cleft Pain Descriptors / Indicators: Sore; Tender Pain Intervention(s): Monitored during session End of Session: Start Time:SLP Start Time (ACUTE ONLY): 1224 Stop Time: SLP Stop Time (ACUTE ONLY): 1245 Time Calculation:SLP Time Calculation (min) (ACUTE ONLY): 21 min Charges: SLP Evaluations $ SLP Speech Visit: 1 Visit SLP Evaluations $BSS Swallow: 1 Procedure $MBS Swallow: 1 Procedure SLP visit diagnosis: SLP Visit Diagnosis: Dysphagia, oropharyngeal phase (R13.12) Past Medical History: Past Medical History: Diagnosis Date  Coronary artery disease   Diverticulitis   NSTEMI (non-ST elevated myocardial infarction) (HCC) 08/09/2018 Past Surgical History: Past Surgical History: Procedure Laterality Date  CORONARY STENT INTERVENTION  08/10/2018  CORONARY STENT INTERVENTION N/A 08/10/2018  Procedure: CORONARY STENT INTERVENTION;  Surgeon: Jordan, Peter M, MD;  Location: [facility] INVASIVE CV LAB;  Service: Cardiovascular;  Laterality: N/A;  LAPAROSCOPIC LYSIS OF ADHESIONS  06/29/2024  Procedure: LYSIS, ADHESIONS, LAPAROSCOPIC;  Surgeon: Rubin Calamity, MD;  Location: [facility] OR;  Service: General;;  LAPAROSCOPY  06/29/2024  Procedure: LAPAROSCOPY, DIAGNOSTIC;  Surgeon: Rubin Calamity, MD;  Location: [facility] OR;  Service: General;;  LEFT HEART CATH AND CORONARY ANGIOGRAPHY N/A 08/10/2018  Procedure: LEFT HEART CATH AND CORONARY ANGIOGRAPHY;  Surgeon: Jordan, Peter M, MD;  Location: [facility] INVASIVE CV LAB;  Service: Cardiovascular;  Laterality: N/A; Damien Blumenthal, M.A., CCC-SLP Speech Language Pathology, Acute Rehabilitation Services Secure Chat preferred [phone] 07/03/2024, 2:14 PM    Medical Consultants:    None.   Subjective:    Adarian R Maute no complaints tolerating his diet.  Objective:    Vitals:   07/03/24 2200 07/03/24 2308 07/04/24 0409 07/04/24 0809  BP: (!) 151/79 (!) 159/87 (!) 150/73 (!) 145/65  Pulse: 70 81 66 65  Resp: (!) 22 20 19 20   Temp: 97.7 F (36.5 C) 98.8 F (37.1 C) 98.6 F (37 C) (!) 97.5 F (36.4 C)  TempSrc: Axillary Axillary Axillary Axillary  SpO2: 94% 91% 94% 94%  Weight:      Height:       SpO2: 94 % O2 Flow Rate (L/min): 5 L/min   Intake/Output Summary (Last 24 hours) at 07/04/2024 1207 Last data filed at 07/04/2024 0900 Gross per 24 hour  Intake 870.94 ml  Output --  Net 870.94 ml   Filed Weights   06/22/24 0855  Weight: 77.8 kg    Exam: General exam: In no acute distress. Respiratory system: Good air movement and clear to auscultation. Cardiovascular system: S1 & S2 heard, RRR. No JVD. Gastrointestinal system: Abdomen is nondistended, soft and nontender.  Extremities: No pedal edema. Skin: No rashes, lesions or ulcers Psychiatry: Judgement and insight appear normal. Mood & affect appropriate.    Data Reviewed:    Labs: Basic Metabolic Panel: Recent Labs  Lab 06/30/24 1009 07/01/24 0543 07/02/24 1019 07/03/24 0512 07/04/24 0528  NA 140 137 137 137 137  K 4.4 4.4 4.0 4.0 4.4  CL 109 107 106 105 104  CO2 19* 21* 22 25 24   GLUCOSE 93 96 109* 87 94  BUN 39* 32* 21 16 14   CREATININE 0.93 0.80 0.95 0.83 0.88  CALCIUM  8.5* 7.8* 8.0* 7.8* 8.3*  MG 2.3 2.0 1.8 2.0 2.0  PHOS 2.9 1.8* 2.0* 2.2* 2.5   GFR Estimated Creatinine Clearance: 65.9 mL/min (by C-G formula based on SCr of 0.88 mg/dL). Liver Function Tests: Recent Labs  Lab 06/30/24 1009 07/01/24 0543 07/02/24 1019 07/03/24 0512 07/04/24 0528  AST 23 28 46* 52* 58*  ALT 20 18 44 69* 95*  ALKPHOS 50 47 51 52 55  BILITOT 0.4 0.4 0.4 0.6 0.4  PROT 6.1* 5.2* 5.2* 5.2* 5.3*  ALBUMIN  3.0* 2.6* 2.6* 2.7* 2.6*   No results for input(s): LIPASE, AMYLASE  in the last 168 hours. No results for input(s): AMMONIA in the last 168 hours. Coagulation profile No results for input(s): INR, PROTIME in the last 168 hours. COVID-19 Labs  No results for input(s): DDIMER, FERRITIN, LDH, CRP in the last 72 hours.  No results found for: SARSCOV2NAA  CBC: Recent Labs  Lab 07/01/24 0543 07/01/24 1646 07/02/24 1019 07/03/24 0512 07/04/24 0528  WBC 15.4* 18.0* 16.6* 13.1* 15.0*  NEUTROABS 12.6* 14.2* 13.9* 9.8* 11.3*  HGB 13.6 15.0 14.2 13.7 13.7  HCT 40.7 45.0 41.6 39.5 39.9  MCV 92.9 92.8 91.6 89.6 91.1  PLT 198 198 202 205 208   Cardiac Enzymes: No results for input(s): CKTOTAL, CKMB, CKMBINDEX, TROPONINI in the last 168 hours. BNP (last 3 results) No results for input(s): PROBNP in the last 8760 hours. CBG: No results for input(s): GLUCAP in the last 168 hours. D-Dimer: No results for input(s): DDIMER in the last 72 hours. Hgb A1c: No results for input(s): HGBA1C in the last 72 hours. Lipid Profile: No results for input(s): CHOL, HDL, LDLCALC, TRIG, CHOLHDL, LDLDIRECT in the last 72 hours. Thyroid  function studies: No results for input(s): TSH, T4TOTAL, T3FREE, THYROIDAB in the last 72 hours.  Invalid input(s): FREET3 Anemia work up: No results for input(s): VITAMINB12, FOLATE, FERRITIN, TIBC, IRON, RETICCTPCT in the last 72 hours. Sepsis Labs: Recent Labs  Lab 07/01/24 1646 07/02/24 1019 07/03/24 0512 07/04/24 0528  WBC 18.0* 16.6* 13.1* 15.0*   Microbiology No results found for this or any previous visit (from the past 240 hours).   Medications:    amiodarone   200 mg Oral Once per day on Monday Tuesday Wednesday Thursday Friday  clopidogrel   75 mg Oral Daily   cyanocobalamin   2,000 mcg Oral Daily   enoxaparin  (LOVENOX ) injection  40 mg Subcutaneous Q24H   ezetimibe   10 mg Oral Daily   feeding supplement  237 mL Oral BID BM   guaiFENesin   1,200 mg Oral  BID   metoprolol  tartrate  50 mg Oral BID   simethicone   80 mg Oral QID   sodium chloride  flush  3 mL Intravenous Q12H   Continuous Infusions:  ampicillin -sulbactam (UNASYN ) IV 3 g (07/04/24 1207)      LOS: 12 days   Erle Odell Castor  [facility]  07/04/2024, 12:07 PM

## 2024-07-04 NOTE — Progress Notes (Addendum)
 "   5 Days Post-Op  Subjective: CC: Patient with abdominal distension overnight. Reports this was not after PO intake. No associated n/v. Primary team obtained xray that showed Gaseous distension of the small bowel with contrast throughout the small bowel. He was made NPO.   He reports since then, he has had 4-5 BM's and now has resolution of abdominal distension. He continues to pass flatus and denies any abdominal pain, n/v. He is asking when he can eat.   Afebrile. No tachycardia or hypotension. On BB. WBC 15 from 13.1. On abx for PNA.   Objective: Vital signs in last 24 hours: Temp:  [97.5 F (36.4 C)-98.8 F (37.1 C)] 97.5 F (36.4 C) (01/14 0809) Pulse Rate:  [65-81] 65 (01/14 0809) Resp:  [16-22] 20 (01/14 0809) BP: (117-169)/(58-87) 145/65 (01/14 0809) SpO2:  [91 %-96 %] 94 % (01/14 0809) Last BM Date : 07/03/24  Intake/Output from previous day: 01/13 0701 - 01/14 0700 In: 870.9 [P.O.:240; IV Piggyback:630.9] Out: 300 [Urine:300] Intake/Output this shift: No intake/output data recorded.  PE: Gen:  Alert, NAD, pleasant Abd: Soft, mild upper abdominal distension, NT, no rigidity or guarding. Incisions with glue intact appears well and are without drainage, bleeding, or signs of infection    Lab Results:  Recent Labs    07/03/24 0512 07/04/24 0528  WBC 13.1* 15.0*  HGB 13.7 13.7  HCT 39.5 39.9  PLT 205 208   BMET Recent Labs    07/03/24 0512 07/04/24 0528  NA 137 137  K 4.0 4.4  CL 105 104  CO2 25 24  GLUCOSE 87 94  Ward 16 14  CREATININE 0.83 0.88  CALCIUM  7.8* 8.3*   PT/INR No results for input(s): LABPROT, INR in the last 72 hours. CMP     Component Value Date/Time   NA 137 07/04/2024 0528   NA 137 03/22/2024 0955   K 4.4 07/04/2024 0528   CL 104 07/04/2024 0528   CO2 24 07/04/2024 0528   GLUCOSE 94 07/04/2024 0528   Ward 14 07/04/2024 0528   Ward 16 03/22/2024 0955   CREATININE 0.88 07/04/2024 0528   CALCIUM  8.3 (L) 07/04/2024 0528    PROT 5.3 (L) 07/04/2024 0528   PROT 7.2 03/22/2024 0955   ALBUMIN  2.6 (L) 07/04/2024 0528   ALBUMIN  4.6 03/22/2024 0955   AST 58 (H) 07/04/2024 0528   ALT 95 (H) 07/04/2024 0528   ALKPHOS 55 07/04/2024 0528   BILITOT 0.4 07/04/2024 0528   BILITOT 0.3 03/22/2024 0955   GFRNONAA >60 07/04/2024 0528   GFRAA 104 08/21/2018 1311   Lipase     Component Value Date/Time   LIPASE 24 06/22/2024 0553    Studies/Results: DG Abd Portable 1V Result Date: 07/04/2024 EXAM: 1 VIEW XRAY OF THE ABDOMEN 07/03/2024 11:55:00 PM COMPARISON: 07/01/2024 CLINICAL HISTORY: The patient presents with abdominal distension, abdominal pain, and shortness of breath. ICD10 codes: 10323 - Abdominal distension, 644753 - Abdominal pain, 141880 - Shortness of breath. FINDINGS: BOWEL: Gaseous distension of the small bowel. PO contrast throughout the small bowel. SOFT TISSUES: No abnormal calcifications. BONES: No acute fracture. LUNG BASES: Atelectasis in lung bases. PLEURAL SPACES: Trace bilateral pleural effusions. IMPRESSION: 1. Gaseous distension of the small bowel consistent with possible ileus. 2. Bibasilar atelectasis and trace bilateral pleural effusions. Electronically signed by: Franklin Naveau MD 07/04/2024 12:08 AM EST RP Workstation: [id number]   DG CHEST PORT 1 VIEW Result Date: 07/04/2024 CLINICAL DATA:  Abdomen distension EXAM: PORTABLE CHEST 1 VIEW COMPARISON:  07/02/2024, 06/27/2024 FINDINGS: Persistent platelike atelectasis in the right lower lung. Streaky atelectasis or mild infiltrate left lung base. Trace pleural effusions. Stable cardiomediastinal silhouette. No pneumothorax IMPRESSION: Persistent platelike atelectasis in the right lower lung. Streaky atelectasis or mild infiltrate at the left lung base. Trace pleural effusions. Electronically Signed   By: Franklin Ward M.D.   On: 07/04/2024 00:03   DG Swallowing Func-Speech Pathology Result Date: 07/03/2024 Table formatting from the original result was not  included. Modified Barium Swallow Study Patient Details Name: Franklin Ward MRN: [id number] Date of Birth: 01/07/1946 Today's Date: 07/03/2024 HPI/PMH: HPI: Franklin Ward 79 yo male admitted from home 06/22/2024 with N/V and abdominal pain. Found to have SBO, s/p laprascopic surgery 1/9.  Pt with presumed aspiration event 1/7 with hypoxia. Made NPO for SBO and diet restarted post surgery.  WBC trending up since diet reinitiation.  CXR 1/12: Streaky atelectasis and or pneumonia in the left base with small pleural effusions, slightly increased. Platelike atelectasis in the right lower lung. No prior ST assessment. PMH includes HTN, HLD, CAD, PVCs, NSTEMI (2020), diverticulosis and abscess. Clinical Impression: Pt presents with overall functional oropharyngeal swallowing. At times, the bolus sits in his pharynx for extended periods of time before pt initiates a swallow but this only occurs when cued to do a bolus hold. When drinking spontaneously, the swallow is initiated at the laryngeal surface of the epiglottis. Epiglottic inversion and laryngeal elevation are complete with only transient penetration of thin liquids (PAS 2). Despite missing dentition and absent upper plate, mastication is thorough. He clears his oropharynx of all but trace residuals across all trials. The 13 mm barium tablet was given with thin liquids and was [facility]. Continue current diet with general aspiration precautions and no ongoing SLP f/u. Will sign off at this time. Factors that may increase risk of adverse event in presence of aspiration Franklin Ward 2021): Factors that may increase risk of adverse event in presence of aspiration Franklin Ward 2021): Respiratory or GI disease Recommendations/Plan: Swallowing Evaluation Recommendations Swallowing Evaluation Recommendations Recommendations: PO diet PO Diet Recommendation: Regular; Thin liquids (Level 0) Liquid Administration via: Cup; Straw Medication Administration: Whole meds with  liquid Supervision: Patient able to self-feed; Set-up assistance for safety Swallowing strategies  : Minimize environmental distractions; Slow rate; Small bites/sips Postural changes: Position pt fully upright for meals Oral care recommendations: Oral care BID (2x/day) Treatment Plan Treatment Plan Treatment recommendations: No treatment recommended at this time Follow-up recommendations: No SLP follow up Functional status assessment: Patient has not had a recent decline in their functional status. Recommendations Recommendations for follow up therapy are one component of a multi-disciplinary discharge planning process, led by the attending physician.  Recommendations may be updated based on patient status, additional functional criteria and insurance authorization. Assessment: Orofacial Exam: Orofacial Exam Oral Cavity: Oral Hygiene: Xerostomia Oral Cavity - Dentition: Dentures, top; Dentures, not available; Missing dentition Orofacial Anatomy: WFL Oral Motor/Sensory Function: WFL Anatomy: Anatomy: WFL Boluses Administered: Boluses Administered Boluses Administered: Thin liquids (Level 0); Mildly thick liquids (Level 2, nectar thick); Moderately thick liquids (Level 3, honey thick); Puree; Solid  Oral Impairment Domain: Oral Impairment Domain Lip Closure: No labial escape Tongue control during bolus hold: Cohesive bolus between tongue to palatal seal Bolus preparation/mastication: Timely and efficient chewing and mashing Bolus transport/lingual motion: Brisk tongue motion Oral residue: Trace residue lining oral structures Location of oral residue : Tongue; Palate Initiation of pharyngeal swallow : Posterior laryngeal surface of the epiglottis  Pharyngeal Impairment Domain: Pharyngeal Impairment Domain Soft palate elevation: No bolus between soft palate (SP)/pharyngeal wall (PW) Laryngeal elevation: Complete superior movement of thyroid  cartilage with complete approximation of arytenoids to epiglottic petiole  Anterior hyoid excursion: Complete anterior movement Epiglottic movement: Complete inversion Laryngeal vestibule closure: Complete, no air/contrast in laryngeal vestibule Pharyngeal stripping wave : Present - complete Pharyngeal contraction (A/P view only): N/A Pharyngoesophageal segment opening: Complete distension and complete duration, no obstruction of flow Tongue base retraction: Trace column of contrast or air between tongue base and PPW Pharyngeal residue: Trace residue within or on pharyngeal structures Location of pharyngeal residue: Valleculae; Pyriform sinuses  Esophageal Impairment Domain: Esophageal Impairment Domain Esophageal clearance upright position: Complete clearance, esophageal coating Pill: Pill Consistency administered: Thin liquids (Level 0) Thin liquids (Level 0): [facility] Penetration/Aspiration Scale Score: Penetration/Aspiration Scale Score 1.  Material does not enter airway: Mildly thick liquids (Level 2, nectar thick); Moderately thick liquids (Level 3, honey thick); Puree; Solid; Pill 2.  Material enters airway, remains ABOVE vocal cords then ejected out: Thin liquids (Level 0) Compensatory Strategies: Compensatory Strategies Compensatory strategies: No   General Information: Caregiver present: No  Diet Prior to this Study: Regular; Thin liquids (Level 0)   Temperature : Normal   Respiratory Status: WFL   Supplemental O2: None (Room air)   History of Recent Intubation: No  Behavior/Cognition: Alert; Cooperative; Pleasant mood Self-Feeding Abilities: Able to self-feed Baseline vocal quality/speech: Dysphonic Volitional Cough: Able to elicit Volitional Swallow: Able to elicit Exam Limitations: No limitations Goal Planning: Prognosis for improved oropharyngeal function: Good No data recorded No data recorded Patient/Family Stated Goal: not stated Consulted and agree with results and recommendations: Patient; Physician Pain: Pain Assessment Pain Assessment: Faces Faces Pain Scale: 0 Pain  Location: L glute near cleft Pain Descriptors / Indicators: Sore; Tender Pain Intervention(s): Monitored during session End of Session: Start Time:SLP Start Time (ACUTE ONLY): 1224 Stop Time: SLP Stop Time (ACUTE ONLY): 1245 Time Calculation:SLP Time Calculation (min) (ACUTE ONLY): 21 min Charges: SLP Evaluations $ SLP Speech Visit: 1 Visit SLP Evaluations $BSS Swallow: 1 Procedure $MBS Swallow: 1 Procedure SLP visit diagnosis: SLP Visit Diagnosis: Dysphagia, oropharyngeal phase (R13.12) Past Medical History: Past Medical History: Diagnosis Date  Coronary artery disease   Diverticulitis   NSTEMI (non-ST elevated myocardial infarction) (HCC) 08/09/2018 Past Surgical History: Past Surgical History: Procedure Laterality Date  CORONARY STENT INTERVENTION  08/10/2018  CORONARY STENT INTERVENTION N/A 08/10/2018  Procedure: CORONARY STENT INTERVENTION;  Surgeon: Jordan, Peter M, MD;  Location: [facility] INVASIVE CV LAB;  Service: Cardiovascular;  Laterality: N/A;  LAPAROSCOPIC LYSIS OF ADHESIONS  06/29/2024  Procedure: LYSIS, ADHESIONS, LAPAROSCOPIC;  Surgeon: Rubin Calamity, MD;  Location: [facility] OR;  Service: General;;  LAPAROSCOPY  06/29/2024  Procedure: LAPAROSCOPY, DIAGNOSTIC;  Surgeon: Rubin Calamity, MD;  Location: [facility] OR;  Service: General;;  LEFT HEART CATH AND CORONARY ANGIOGRAPHY N/A 08/10/2018  Procedure: LEFT HEART CATH AND CORONARY ANGIOGRAPHY;  Surgeon: Jordan, Peter M, MD;  Location: [facility] INVASIVE CV LAB;  Service: Cardiovascular;  Laterality: N/A; Damien Blumenthal, M.A., CCC-SLP Speech Language Pathology, Acute Rehabilitation Services Secure Chat preferred [phone] 07/03/2024, 2:14 PM  DG CHEST PORT 1 VIEW Result Date: 07/02/2024 CLINICAL DATA:  Atelectasis EXAM: PORTABLE CHEST 1 VIEW COMPARISON:  06/27/2024, 03/22/2024 FINDINGS: Platelike atelectasis in the right lower lung. Streaky atelectasis and or pneumonia in the left base with small pleural effusions. Stable cardiomediastinal silhouette with aortic  atherosclerosis. IMPRESSION: Streaky atelectasis and or pneumonia in the left base with small pleural effusions,  slightly increased. Platelike atelectasis in the right lower lung. Electronically Signed   By: Franklin Ward M.D.   On: 07/02/2024 23:35    Anti-infectives: Anti-infectives (From admission, onward)    Start     Dose/Rate Route Frequency Ordered Stop   06/28/24 1200  Ampicillin -Sulbactam (UNASYN ) 3 g in sodium chloride  0.9 % 100 mL IVPB        3 g 200 mL/hr over 30 Minutes Intravenous Every 6 hours 06/28/24 1150 07/05/24 1159        Assessment/Plan POD 5: S/P Diagnostic laparoscopy, lyse of adhesions by AR on 1/9 - Will restart patient on FLD. If tolerates, can advance to soft diet. Would like to ensure he tolerates diet advancement again before d/c.  - If patient has recurrent abdominal complaints with diet advancement, please alert our team.  - We will follow with you  FEN: FLD diet, ADAT to soft, scheduled simethicone , IVF per primary team. VTE: SCDs, Lovenox , Plavix   ID: [email or web address]  due to aspiration pneumonia.   LOS: 12 days    Ozell CHRISTELLA Shaper, [facility] 07/04/2024, 8:39 AM Please see Amion for pager number during day hours 7:00am-4:30pm  "

## 2024-07-04 NOTE — Plan of Care (Signed)

## 2024-07-04 NOTE — Progress Notes (Signed)
 Jolynn Pack [phone] [facility] Liaison note:    This is a pending outpatient based Palliative care patient with [facility].    Hospital liaison will continue to follow for discharge disposition.?    Please call for any outpatient based palliative care related questions or concerns.    Thank you,    Eleanor Nail, LPN [facility] Liaison [phone]

## 2024-07-04 NOTE — Progress Notes (Signed)
" °  Overnight Coverage Note   Patient was seen for increased abdominal distension and discomfort.   He was admitted with SBO and underwent lysis of adhesions on 06/29/24. He had been progressing well, tolerating PO and having BMs, and surgery signed-off yesterday.   Tonight, he reports increased abdominal distension and discomfort with foul taste in his mouth and some dyspnea that he attributes to abdominal distension.   He continues to pass flatus and has active bowel sounds.   KUB demonstrates gaseous distension of SB consistent with ileus.   Plan to keep NPO for tonight and continue symptomatic management.  "

## 2024-07-04 NOTE — Progress Notes (Addendum)
" ° ° °   07/03/24 2315  Provider Notification  Provider Name/Title Dr. Charlton  Date Provider Notified 07/03/24  Time Provider Notified 2315  Method of Notification Page (secured chat)  Notification Reason Change in status (patient complain increasing abdominal distention, SOB)  Provider response See new orders (abdominal xray and CXR and npo for observation)  Date of Provider Response 07/03/24  Time of Provider Response 2320     Patient had 4 episodes of bowel movement all night. Maintained NPO. Care ongoing. "

## 2024-07-04 NOTE — Progress Notes (Signed)
" ° °  Inpatient Rehabilitation Admissions Coordinator   Noted issues overnight. I await medical clearance and insurance determination for possible CIR admit.  Heron Leavell, RN, MSN Rehab Admissions Coordinator [phone] 07/04/2024 10:45 AM  "

## 2024-07-04 NOTE — Plan of Care (Signed)

## 2024-07-04 NOTE — Progress Notes (Signed)
 Physical Therapy Treatment Patient Details Name: Franklin Ward MRN: [id number] DOB: 1945/08/07 Today's Date: 07/04/2024   History of Present Illness 79 y.o. male presents to [facility] hospital on 06/22/2024 with nausea, vomiting and abdominal pain. Imaging notable for SBO. Pt underwent laparascopy for lysis of adhesions on 06/29/24. PMH includes HTN, HLD, CAD, PVCs, diverticulosis and abscess.    PT Comments  Pt supine in bed on entry, reports numerous trips to bathroom today and desire to get out of room. Pt requires increased time for all activity but is quite capable of completion. Pt is supervision for bed mobility,  and contact guard for transfers and ambulation up and back the ramp on the 6 floor skywalk. Pt continues to progress mobility and patient will benefit from intensive inpatient follow-up therapy, >3 hours/day    If plan is discharge home, recommend the following: Assistance with cooking/housework;Assist for transportation;Help with stairs or ramp for entrance;A little help with walking and/or transfers;A little help with bathing/dressing/bathroom   Can travel by private vehicle      Yes   Equipment Recommendations  BSC/3in1    Recommendations for Other Services Rehab consult     Precautions / Restrictions Precautions Precautions: Fall Recall of Precautions/Restrictions: Intact Restrictions Weight Bearing Restrictions Per Provider Order: No     Mobility  Bed Mobility Overal bed mobility: Needs Assistance Bed Mobility: Sit to Supine     Supine to sit: Supervision, Used rails, HOB elevated Sit to supine: Used rails, Supervision, HOB elevated   General bed mobility comments: pt able to complete getting to the EoB and back to bed with use of bedrails and supervision, pt requires increased time  but no physical assist    Transfers Overall transfer level: Needs assistance   Transfers: Sit to/from Stand, Bed to chair/wheelchair/BSC Sit to Stand: Contact guard assist            General transfer comment: good power up, contact guard for safety with steadying    Ambulation/Gait Ambulation/Gait assistance: Contact guard assist Gait Distance (Feet): 50 Feet (x3 with increased standing rest break) Assistive device: Rolling walker (2 wheels) Gait Pattern/deviations: Step-through pattern, Decreased step length - right, Decreased step length - left, Decreased stride length, Trunk flexed Gait velocity: Decreased Gait velocity interpretation: <1.8 ft/sec, indicate of risk for recurrent falls   General Gait Details: pt reports many trips to bathroom and back and wants to get out of the room. Pt able to ambulate out of room and up the hill on the 6th floor sky walk, pt is impressed by his ascent with only minor SoB at top, quickly recovers, rests while daughter goes to the soda machine and then is able to descend the ramp back to his room. pt noted to be self correcting his proximity to RW and improved upright posture,          Balance Overall balance assessment: Needs assistance Sitting-balance support: No upper extremity supported, Feet supported Sitting balance-Leahy Scale: Good     Standing balance support: Reliant on assistive device for balance Standing balance-Leahy Scale: Fair Standing balance comment: Dependent on external support                            Communication Communication Communication: Impaired Factors Affecting Communication: Hearing impaired  Cognition Arousal: Alert Behavior During Therapy: WFL for tasks assessed/performed   PT - Cognitive impairments: No apparent impairments  Following commands: Intact      Cueing Cueing Techniques: Verbal cues     General Comments General comments (skin integrity, edema, etc.): VSS on RA,  pt able to pull himself up in bed in Trendelenberg position, without assist, daughter in room at end of session      Pertinent Vitals/Pain Pain  Assessment Pain Assessment: Faces Faces Pain Scale: Hurts a little bit Pain Location: L glute near cleft Pain Descriptors / Indicators: Sore, Tender Pain Intervention(s): Limited activity within patient's tolerance, Monitored during session, Repositioned     PT Goals (current goals can now be found in the care plan section) Progress towards PT goals: Progressing toward goals    Frequency    Min 2X/week       AM-PAC PT 6 Clicks Mobility   Outcome Measure  Help needed turning from your back to your side while in a flat bed without using bedrails?: A Little Help needed moving from lying on your back to sitting on the side of a flat bed without using bedrails?: A Little Help needed moving to and from a bed to a chair (including a wheelchair)?: A Little Help needed standing up from a chair using your arms (e.g., wheelchair or bedside chair)?: A Little Help needed to walk in hospital room?: A Little Help needed climbing 3-5 steps with a railing? : Total 6 Click Score: 16    End of Session Equipment Utilized During Treatment: Gait belt Activity Tolerance: Patient tolerated treatment well Patient left: in bed;with call bell/phone within reach;with family/visitor present Nurse Communication: Mobility status PT Visit Diagnosis: Other abnormalities of gait and mobility (R26.89);Muscle weakness (generalized) (M62.81)     Time: 8473-8447 PT Time Calculation (min) (ACUTE ONLY): 26 min  Charges:    $Gait Training: 8-22 mins $Therapeutic Exercise: 8-22 mins PT General Charges $$ ACUTE PT VISIT: 1 Visit                     Amera Banos B. Fleeta Lapidus PT, DPT Acute Rehabilitation Services Please use secure chat or  Call Office [phone]    Almarie KATHEE Fleeta [facility] 07/04/2024, 4:09 PM

## 2024-07-05 ENCOUNTER — Other Ambulatory Visit (HOSPITAL_COMMUNITY): Payer: Self-pay

## 2024-07-05 DIAGNOSIS — K56609 Unspecified intestinal obstruction, unspecified as to partial versus complete obstruction: Secondary | ICD-10-CM | POA: Diagnosis not present

## 2024-07-05 NOTE — TOC Progression Note (Addendum)
 Transition of Care [facility]) - Progression Note    Patient Details  Name: DANTONIO JUSTEN MRN: [id number] Date of Birth: January 25, 1946  [facility] CM/SW Contact  Corean JAYSON Canary, RN Phone Number: 07/05/2024, 8:44 AM  Clinical Narrative:    Insurance declined CIR, the patient is improving daughter wishes for Home health. MD messaged by KATHEE Leavell. Sent out in [facility] [address] and spoke to angela, the patients daughter. Send her via text  Medicare.gov choices . She will text of call back  with choice She states they have all DME needed at present. She will text or call back with choice.   IPCM will follow 1215 Daughter selected [facility]. For home health  Updated in HUB  Expected Discharge Plan: IP Rehab Facility Barriers to Discharge: Continued Medical Work up               Expected Discharge Plan and Services   Discharge Planning Services: CM Consult   Living arrangements for the past 2 months: Single Family Home                                       Social Drivers of Health (SDOH) Interventions SDOH Screenings   Food Insecurity: No Food Insecurity (06/22/2024)  Housing: Low Risk (06/22/2024)  Transportation Needs: No Transportation Needs (06/22/2024)  Utilities: Not At Risk (06/22/2024)  Social Connections: Moderately Isolated (06/22/2024)  Tobacco Use: Medium Risk (06/29/2024)    Readmission Risk Interventions     No data to display

## 2024-07-05 NOTE — Progress Notes (Signed)
 "   6 Days Post-Op  Subjective: CC: Tolerating soft diet without abdominal pain, distension, n/v. Passing flatus. BM yesterday. He did not require any PRN pain or anti-nausea medication over the last 24 hours. Wants to go home. Denied by [facility]. TOC working on [facility].   Objective: Vital signs in last 24 hours: Temp:  [97.7 F (36.5 C)-97.9 F (36.6 C)] 97.7 F (36.5 C) (01/15 0840) Pulse Rate:  [63-70] 70 (01/15 0840) Resp:  [18-20] 18 (01/15 0840) BP: (135-156)/(66-73) 135/67 (01/15 0840) SpO2:  [95 %-96 %] 96 % (01/15 0840) Last BM Date : 07/04/24  Intake/Output from previous day: 01/14 0701 - 01/15 0700 In: 683 [P.O.:480; I.V.:3; IV Piggyback:200] Out: -  Intake/Output this shift: Total I/O In: -  Out: 300 [Urine:300]  PE: Gen:  Alert, NAD, pleasant Abd: Soft, improved mild upper abdominal distension, NT, no rigidity or guarding. Incisions with glue intact appears well and are without drainage, bleeding, or signs of infection   Lab Results:  Recent Labs    07/03/24 0512 07/04/24 0528  WBC 13.1* 15.0*  HGB 13.7 13.7  HCT 39.5 39.9  PLT 205 208   BMET Recent Labs    07/03/24 0512 07/04/24 0528  NA 137 137  K 4.0 4.4  CL 105 104  CO2 25 24  GLUCOSE 87 94  BUN 16 14  CREATININE 0.83 0.88  CALCIUM  7.8* 8.3*   PT/INR No results for input(s): LABPROT, INR in the last 72 hours. CMP     Component Value Date/Time   NA 137 07/04/2024 0528   NA 137 03/22/2024 0955   K 4.4 07/04/2024 0528   CL 104 07/04/2024 0528   CO2 24 07/04/2024 0528   GLUCOSE 94 07/04/2024 0528   BUN 14 07/04/2024 0528   BUN 16 03/22/2024 0955   CREATININE 0.88 07/04/2024 0528   CALCIUM  8.3 (L) 07/04/2024 0528   PROT 5.3 (L) 07/04/2024 0528   PROT 7.2 03/22/2024 0955   ALBUMIN  2.6 (L) 07/04/2024 0528   ALBUMIN  4.6 03/22/2024 0955   AST 58 (H) 07/04/2024 0528   ALT 95 (H) 07/04/2024 0528   ALKPHOS 55 07/04/2024 0528   BILITOT 0.4 07/04/2024 0528   BILITOT 0.3 03/22/2024 0955    GFRNONAA >60 07/04/2024 0528   GFRAA 104 08/21/2018 1311   Lipase     Component Value Date/Time   LIPASE 24 06/22/2024 0553    Studies/Results: DG Abd Portable 1V Result Date: 07/04/2024 EXAM: 1 VIEW XRAY OF THE ABDOMEN 07/03/2024 11:55:00 PM COMPARISON: 07/01/2024 CLINICAL HISTORY: The patient presents with abdominal distension, abdominal pain, and shortness of breath. ICD10 codes: 10323 - Abdominal distension, 644753 - Abdominal pain, 141880 - Shortness of breath. FINDINGS: BOWEL: Gaseous distension of the small bowel. PO contrast throughout the small bowel. SOFT TISSUES: No abnormal calcifications. BONES: No acute fracture. LUNG BASES: Atelectasis in lung bases. PLEURAL SPACES: Trace bilateral pleural effusions. IMPRESSION: 1. Gaseous distension of the small bowel consistent with possible ileus. 2. Bibasilar atelectasis and trace bilateral pleural effusions. Electronically signed by: Morgane Naveau MD 07/04/2024 12:08 AM EST RP Workstation: [id number]   DG CHEST PORT 1 VIEW Result Date: 07/04/2024 CLINICAL DATA:  Abdomen distension EXAM: PORTABLE CHEST 1 VIEW COMPARISON:  07/02/2024, 06/27/2024 FINDINGS: Persistent platelike atelectasis in the right lower lung. Streaky atelectasis or mild infiltrate left lung base. Trace pleural effusions. Stable cardiomediastinal silhouette. No pneumothorax IMPRESSION: Persistent platelike atelectasis in the right lower lung. Streaky atelectasis or mild infiltrate at the left lung base. Trace  pleural effusions. Electronically Signed   By: Luke Bun M.D.   On: 07/04/2024 00:03   DG Swallowing Func-Speech Pathology Result Date: 07/03/2024 Table formatting from the original result was not included. Modified Barium Swallow Study Patient Details Name: NICOLAE VASEK MRN: [id number] Date of Birth: 1945/08/07 Today's Date: 07/03/2024 HPI/PMH: HPI: Misael Rotundo 79 yo male admitted from home 06/22/2024 with N/V and abdominal pain. Found to have SBO, s/p laprascopic  surgery 1/9.  Pt with presumed aspiration event 1/7 with hypoxia. Made NPO for SBO and diet restarted post surgery.  WBC trending up since diet reinitiation.  CXR 1/12: Streaky atelectasis and or pneumonia in the left base with small pleural effusions, slightly increased. Platelike atelectasis in the right lower lung. No prior ST assessment. PMH includes HTN, HLD, CAD, PVCs, NSTEMI (2020), diverticulosis and abscess. Clinical Impression: Pt presents with overall functional oropharyngeal swallowing. At times, the bolus sits in his pharynx for extended periods of time before pt initiates a swallow but this only occurs when cued to do a bolus hold. When drinking spontaneously, the swallow is initiated at the laryngeal surface of the epiglottis. Epiglottic inversion and laryngeal elevation are complete with only transient penetration of thin liquids (PAS 2). Despite missing dentition and absent upper plate, mastication is thorough. He clears his oropharynx of all but trace residuals across all trials. The 13 mm barium tablet was given with thin liquids and was [facility]. Continue current diet with general aspiration precautions and no ongoing SLP f/u. Will sign off at this time. Factors that may increase risk of adverse event in presence of aspiration [name]): Factors that may increase risk of adverse event in presence of aspiration [name]): Respiratory or GI disease Recommendations/Plan: Swallowing Evaluation Recommendations Swallowing Evaluation Recommendations Recommendations: PO diet PO Diet Recommendation: Regular; Thin liquids (Level 0) Liquid Administration via: Cup; Straw Medication Administration: Whole meds with liquid Supervision: Patient able to self-feed; Set-up assistance for safety Swallowing strategies  : Minimize environmental distractions; Slow rate; Small bites/sips Postural changes: Position pt fully upright for meals Oral care recommendations: Oral care BID (2x/day)  Treatment Plan Treatment Plan Treatment recommendations: No treatment recommended at this time Follow-up recommendations: No SLP follow up Functional status assessment: Patient has not had a recent decline in their functional status. Recommendations Recommendations for follow up therapy are one component of a multi-disciplinary discharge planning process, led by the attending physician.  Recommendations may be updated based on patient status, additional functional criteria and insurance authorization. Assessment: Orofacial Exam: Orofacial Exam Oral Cavity: Oral Hygiene: Xerostomia Oral Cavity - Dentition: Dentures, top; Dentures, not available; Missing dentition Orofacial Anatomy: WFL Oral Motor/Sensory Function: WFL Anatomy: Anatomy: WFL Boluses Administered: Boluses Administered Boluses Administered: Thin liquids (Level 0); Mildly thick liquids (Level 2, nectar thick); Moderately thick liquids (Level 3, honey thick); Puree; Solid  Oral Impairment Domain: Oral Impairment Domain Lip Closure: No labial escape Tongue control during bolus hold: Cohesive bolus between tongue to palatal seal Bolus preparation/mastication: Timely and efficient chewing and mashing Bolus transport/lingual motion: Brisk tongue motion Oral residue: Trace residue lining oral structures Location of oral residue : Tongue; Palate Initiation of pharyngeal swallow : Posterior laryngeal surface of the epiglottis  Pharyngeal Impairment Domain: Pharyngeal Impairment Domain Soft palate elevation: No bolus between soft palate (SP)/pharyngeal wall (PW) Laryngeal elevation: Complete superior movement of thyroid  cartilage with complete approximation of arytenoids to epiglottic petiole Anterior hyoid excursion: Complete anterior movement Epiglottic movement: Complete inversion Laryngeal vestibule closure: Complete,  no air/contrast in laryngeal vestibule Pharyngeal stripping wave : Present - complete Pharyngeal contraction (A/P view only): N/A  Pharyngoesophageal segment opening: Complete distension and complete duration, no obstruction of flow Tongue base retraction: Trace column of contrast or air between tongue base and PPW Pharyngeal residue: Trace residue within or on pharyngeal structures Location of pharyngeal residue: Valleculae; Pyriform sinuses  Esophageal Impairment Domain: Esophageal Impairment Domain Esophageal clearance upright position: Complete clearance, esophageal coating Pill: Pill Consistency administered: Thin liquids (Level 0) Thin liquids (Level 0): [facility] Penetration/Aspiration Scale Score: Penetration/Aspiration Scale Score 1.  Material does not enter airway: Mildly thick liquids (Level 2, nectar thick); Moderately thick liquids (Level 3, honey thick); Puree; Solid; Pill 2.  Material enters airway, remains ABOVE vocal cords then ejected out: Thin liquids (Level 0) Compensatory Strategies: Compensatory Strategies Compensatory strategies: No   General Information: Caregiver present: No  Diet Prior to this Study: Regular; Thin liquids (Level 0)   Temperature : Normal   Respiratory Status: WFL   Supplemental O2: None (Room air)   History of Recent Intubation: No  Behavior/Cognition: Alert; Cooperative; Pleasant mood Self-Feeding Abilities: Able to self-feed Baseline vocal quality/speech: Dysphonic Volitional Cough: Able to elicit Volitional Swallow: Able to elicit Exam Limitations: No limitations Goal Planning: Prognosis for improved oropharyngeal function: Good No data recorded No data recorded Patient/Family Stated Goal: not stated Consulted and agree with results and recommendations: Patient; Physician Pain: Pain Assessment Pain Assessment: Faces Faces Pain Scale: 0 Pain Location: L glute near cleft Pain Descriptors / Indicators: Sore; Tender Pain Intervention(s): Monitored during session End of Session: Start Time:SLP Start Time (ACUTE ONLY): 1224 Stop Time: SLP Stop Time (ACUTE ONLY): 1245 Time Calculation:SLP Time Calculation (min)  (ACUTE ONLY): 21 min Charges: SLP Evaluations $ SLP Speech Visit: 1 Visit SLP Evaluations $BSS Swallow: 1 Procedure $MBS Swallow: 1 Procedure SLP visit diagnosis: SLP Visit Diagnosis: Dysphagia, oropharyngeal phase (R13.12) Past Medical History: Past Medical History: Diagnosis Date  Coronary artery disease   Diverticulitis   NSTEMI (non-ST elevated myocardial infarction) (HCC) 08/09/2018 Past Surgical History: Past Surgical History: Procedure Laterality Date  CORONARY STENT INTERVENTION  08/10/2018  CORONARY STENT INTERVENTION N/A 08/10/2018  Procedure: CORONARY STENT INTERVENTION;  Surgeon: Jordan, Peter M, MD;  Location: [facility] INVASIVE CV LAB;  Service: Cardiovascular;  Laterality: N/A;  LAPAROSCOPIC LYSIS OF ADHESIONS  06/29/2024  Procedure: LYSIS, ADHESIONS, LAPAROSCOPIC;  Surgeon: Rubin Calamity, MD;  Location: [facility] OR;  Service: General;;  LAPAROSCOPY  06/29/2024  Procedure: LAPAROSCOPY, DIAGNOSTIC;  Surgeon: Rubin Calamity, MD;  Location: [facility] OR;  Service: General;;  LEFT HEART CATH AND CORONARY ANGIOGRAPHY N/A 08/10/2018  Procedure: LEFT HEART CATH AND CORONARY ANGIOGRAPHY;  Surgeon: Jordan, Peter M, MD;  Location: [facility] INVASIVE CV LAB;  Service: Cardiovascular;  Laterality: N/A; Damien Blumenthal, M.A., CCC-SLP Speech Language Pathology, Acute Rehabilitation Services Secure Chat preferred [phone] 07/03/2024, 2:14 PM   Anti-infectives: Anti-infectives (From admission, onward)    Start     Dose/Rate Route Frequency Ordered Stop   06/28/24 1200  Ampicillin -Sulbactam (UNASYN ) 3 g in sodium chloride  0.9 % 100 mL IVPB        3 g 200 mL/hr over 30 Minutes Intravenous Every 6 hours 06/28/24 1150 07/05/24 1159        Assessment/Plan POD 6: S/P Diagnostic laparoscopy, lyse of adhesions by AR on 1/9 - Patient tolerating diet and having bowel function. Exam benign. Incisions, cdi. He is okay for d/c from our standpoint. Discussed discharge instructions, restrictions and return/call back precautions. Follow up  being  arranged. We will sign off. Please call back with questions or concerns. Message sent to [facility].   FEN: Soft diet, IVF per primary team. VTE: SCDs, Lovenox , Plavix   ID: [email or web address]  due to aspiration pneumonia.   LOS: 13 days    Ozell CHRISTELLA Shaper, [facility] 07/05/2024, 9:10 AM Please see Amion for pager number during day hours 7:00am-4:30pm  "

## 2024-07-05 NOTE — Discharge Summary (Signed)
 Physician Discharge Summary  Franklin Ward FMW:[id number] DOB: 04/23/1946 DOA: 06/22/2024  PCP: Arloa Elsie JONELLE, MD  Admit date: 06/22/2024 Discharge date: 07/05/2024  Admitted From: Home Disposition:  Home  Recommendations for Outpatient Follow-up:  Follow up with general surgery in 1-2 weeks Please obtain BMP/CBC in one week   Home Health:Yes Equipment/Devices:None  Discharge Condition:Stable CODE STATUS:Full Diet recommendation: Heart Healthy   Brief/Interim Summary:  79 y.o. male past medical history significant for essential hypertension CAD status post stent PVC diverticulosis and diverticulitis with abscess comes into the hospital for abdominal pain CT scan of the abdomen pelvis shows severe proximal bowel dilation probable small bowel obstruction.  General surgery was consulted.  He had a prolonged hospital course with multiple NG tube placement and bravely improved.  But then decompensated so surgery opted for diagnostic laparoscopy there interim he aspirated and had respiratory failure probably due to aspiration start empirically on IV Unasyn .   Discharge Diagnoses:  Principal Problem:   SBO (small bowel obstruction) (HCC) Active Problems:   Benign essential HTN   PVC's (premature ventricular contractions)   Coronary arteriosclerosis   HLD (hyperlipidemia)  Small bowel obstruction: With a prolonged small bowel obstruction with multiple NG tube attempts. General surgery was consulted they decided for surgical intervention 06/30/2023 with a laparoscopic lysis of adhesion. His diet was advanced he was able to tolerated. PT evaluated the patient, he did an inpatient rehab but denied by insurance. He was discussed with daughter she will take him home with home health PT.  Acute respiratory failure with hypoxia likely due to aspiration pneumonia: Completed course of antibiotics in house.  Benign essential hypertension: No changes made to his medication.  Electrolyte  imbalance hypophosphatemia repleted now resolved.  Hyperlipidemia: Continue Setia.  Elevated LFTs: Follow-up PCP as an outpatient.  They are trending down. Question do to acute event.  Metabolic acidosis: Now resolved.  CAD with history of stenting: No change made to his medication continue Plavix .  PVCs: Currently asymptomatic continue amiodarone .  Sacral decubitus ulcer stage I present on admission:   Discharge Instructions  Discharge Instructions     Discharge wound care:   Complete by: As directed    Per wound care instructions   Increase activity slowly   Complete by: As directed       Allergies as of 07/05/2024       Reactions   Bee Venom Hives, Anaphylaxis   Erythromycin Rash   Tetracyclines & Related Rash        Medication List     TAKE these medications    amiodarone  200 MG tablet Commonly known as: PACERONE  TAKE ONE TABLET BY MOUTH DAILY MONDAY - FRIDAY (DO NOT TAKE SATURDAY OR SUNDAY)   B-12 2500 MCG Tabs Take 1 tablet by mouth daily.   CALCIUM  600 + D PO Take 1 tablet by mouth daily.   carboxymethylcellulose 0.5 % Soln Commonly known as: REFRESH PLUS Place 1 drop into both eyes daily as needed (dry eyes).   cholecalciferol 25 MCG (1000 UNIT) tablet Commonly known as: VITAMIN D3 Take 5,000 Units by mouth daily.   clopidogrel  75 MG tablet Commonly known as: PLAVIX  Take 1 tablet (75 mg total) by mouth daily.   ezetimibe  10 MG tablet Commonly known as: ZETIA  TAKE ONE TABLET BY MOUTH DAILY   metoprolol  tartrate 50 MG tablet Commonly known as: LOPRESSOR  Take 1 tablet (50 mg total) by mouth 2 (two) times daily.   multivitamin tablet Take 1 tablet by mouth daily.  nitroGLYCERIN  0.3 MG SL tablet Commonly known as: Nitrostat  Place 1 tablet (0.3 mg total) under the tongue every 5 (five) minutes as needed for chest pain.   OSTEO BI-FLEX ADV TRIPLE ST PO Take 1 tablet by mouth 2 (two) times daily.   PROBIOTIC BLEND PO Take 1  capsule by mouth daily. Floragen   traMADol  50 MG tablet Commonly known as: ULTRAM  Take 1 tablet (50 mg total) by mouth every 6 (six) hours as needed.   Turmeric 500 MG Caps Take 500 mg by mouth daily.   VITAMIN C PO Take 1,000 mg by mouth daily.   zinc gluconate 50 MG tablet Take 50 mg by mouth daily.               Discharge Care Instructions  (From admission, onward)           Start     Ordered   07/05/24 0000  Discharge wound care:       Comments: Per wound care instructions   07/05/24 0940            Follow-up Information     Arloa Elsie SAUNDERS, MD Follow up.   Specialty: Family Medicine Contact information: [address] [employer] [place] [address] [phone]         Maczis, Tonja Barban, PA-C Follow up.   Specialty: General Surgery Why: Office will call you with follow up Contact information: [address] [facility] [place] [address] [facility] [place] [address] [phone]                Allergies[1]  Consultations: General Surgery   Procedures/Studies: DG Abd Portable 1V Result Date: 07/04/2024 EXAM: 1 VIEW XRAY OF THE ABDOMEN 07/03/2024 11:55:00 PM COMPARISON: 07/01/2024 CLINICAL HISTORY: The patient presents with abdominal distension, abdominal pain, and shortness of breath. ICD10 codes: 10323 - Abdominal distension, 644753 - Abdominal pain, 141880 - Shortness of breath. FINDINGS: BOWEL: Gaseous distension of the small bowel. PO contrast throughout the small bowel. SOFT TISSUES: No abnormal calcifications. BONES: No acute fracture. LUNG BASES: Atelectasis in lung bases. PLEURAL SPACES: Trace bilateral pleural effusions. IMPRESSION: 1. Gaseous distension of the small bowel consistent with possible ileus. 2. Bibasilar atelectasis and trace bilateral pleural effusions. Electronically signed by: Morgane Naveau MD 07/04/2024 12:08 AM EST RP Workstation: [id number]   DG CHEST PORT 1 VIEW Result Date: 07/04/2024 CLINICAL  DATA:  Abdomen distension EXAM: PORTABLE CHEST 1 VIEW COMPARISON:  07/02/2024, 06/27/2024 FINDINGS: Persistent platelike atelectasis in the right lower lung. Streaky atelectasis or mild infiltrate left lung base. Trace pleural effusions. Stable cardiomediastinal silhouette. No pneumothorax IMPRESSION: Persistent platelike atelectasis in the right lower lung. Streaky atelectasis or mild infiltrate at the left lung base. Trace pleural effusions. Electronically Signed   By: Luke Bun M.D.   On: 07/04/2024 00:03   DG Swallowing Func-Speech Pathology Result Date: 07/03/2024 Table formatting from the original result was not included. Modified Barium Swallow Study Patient Details Name: MICHIAL DISNEY MRN: [id number] Date of Birth: Feb 03, 1946 Today's Date: 07/03/2024 HPI/PMH: HPI: Kenton Trice 79 yo male admitted from home 06/22/2024 with N/V and abdominal pain. Found to have SBO, s/p laprascopic surgery 1/9.  Pt with presumed aspiration event 1/7 with hypoxia. Made NPO for SBO and diet restarted post surgery.  WBC trending up since diet reinitiation.  CXR 1/12: Streaky atelectasis and or pneumonia in the left base with small pleural effusions, slightly increased. Platelike atelectasis in the right lower lung. No prior ST assessment. PMH  includes HTN, HLD, CAD, PVCs, NSTEMI (2020), diverticulosis and abscess. Clinical Impression: Pt presents with overall functional oropharyngeal swallowing. At times, the bolus sits in his pharynx for extended periods of time before pt initiates a swallow but this only occurs when cued to do a bolus hold. When drinking spontaneously, the swallow is initiated at the laryngeal surface of the epiglottis. Epiglottic inversion and laryngeal elevation are complete with only transient penetration of thin liquids (PAS 2). Despite missing dentition and absent upper plate, mastication is thorough. He clears his oropharynx of all but trace residuals across all trials. The 13 mm barium tablet  was given with thin liquids and was [facility]. Continue current diet with general aspiration precautions and no ongoing SLP f/u. Will sign off at this time. Factors that may increase risk of adverse event in presence of aspiration [name]): Factors that may increase risk of adverse event in presence of aspiration [name]): Respiratory or GI disease Recommendations/Plan: Swallowing Evaluation Recommendations Swallowing Evaluation Recommendations Recommendations: PO diet PO Diet Recommendation: Regular; Thin liquids (Level 0) Liquid Administration via: Cup; Straw Medication Administration: Whole meds with liquid Supervision: Patient able to self-feed; Set-up assistance for safety Swallowing strategies  : Minimize environmental distractions; Slow rate; Small bites/sips Postural changes: Position pt fully upright for meals Oral care recommendations: Oral care BID (2x/day) Treatment Plan Treatment Plan Treatment recommendations: No treatment recommended at this time Follow-up recommendations: No SLP follow up Functional status assessment: Patient has not had a recent decline in their functional status. Recommendations Recommendations for follow up therapy are one component of a multi-disciplinary discharge planning process, led by the attending physician.  Recommendations may be updated based on patient status, additional functional criteria and insurance authorization. Assessment: Orofacial Exam: Orofacial Exam Oral Cavity: Oral Hygiene: Xerostomia Oral Cavity - Dentition: Dentures, top; Dentures, not available; Missing dentition Orofacial Anatomy: WFL Oral Motor/Sensory Function: WFL Anatomy: Anatomy: WFL Boluses Administered: Boluses Administered Boluses Administered: Thin liquids (Level 0); Mildly thick liquids (Level 2, nectar thick); Moderately thick liquids (Level 3, honey thick); Puree; Solid  Oral Impairment Domain: Oral Impairment Domain Lip Closure: No labial escape Tongue control  during bolus hold: Cohesive bolus between tongue to palatal seal Bolus preparation/mastication: Timely and efficient chewing and mashing Bolus transport/lingual motion: Brisk tongue motion Oral residue: Trace residue lining oral structures Location of oral residue : Tongue; Palate Initiation of pharyngeal swallow : Posterior laryngeal surface of the epiglottis  Pharyngeal Impairment Domain: Pharyngeal Impairment Domain Soft palate elevation: No bolus between soft palate (SP)/pharyngeal wall (PW) Laryngeal elevation: Complete superior movement of thyroid  cartilage with complete approximation of arytenoids to epiglottic petiole Anterior hyoid excursion: Complete anterior movement Epiglottic movement: Complete inversion Laryngeal vestibule closure: Complete, no air/contrast in laryngeal vestibule Pharyngeal stripping wave : Present - complete Pharyngeal contraction (A/P view only): N/A Pharyngoesophageal segment opening: Complete distension and complete duration, no obstruction of flow Tongue base retraction: Trace column of contrast or air between tongue base and PPW Pharyngeal residue: Trace residue within or on pharyngeal structures Location of pharyngeal residue: Valleculae; Pyriform sinuses  Esophageal Impairment Domain: Esophageal Impairment Domain Esophageal clearance upright position: Complete clearance, esophageal coating Pill: Pill Consistency administered: Thin liquids (Level 0) Thin liquids (Level 0): [facility] Penetration/Aspiration Scale Score: Penetration/Aspiration Scale Score 1.  Material does not enter airway: Mildly thick liquids (Level 2, nectar thick); Moderately thick liquids (Level 3, honey thick); Puree; Solid; Pill 2.  Material enters airway, remains ABOVE vocal cords then ejected out: Thin liquids (  Level 0) Compensatory Strategies: Compensatory Strategies Compensatory strategies: No   General Information: Caregiver present: No  Diet Prior to this Study: Regular; Thin liquids (Level 0)   Temperature  : Normal   Respiratory Status: WFL   Supplemental O2: None (Room air)   History of Recent Intubation: No  Behavior/Cognition: Alert; Cooperative; Pleasant mood Self-Feeding Abilities: Able to self-feed Baseline vocal quality/speech: Dysphonic Volitional Cough: Able to elicit Volitional Swallow: Able to elicit Exam Limitations: No limitations Goal Planning: Prognosis for improved oropharyngeal function: Good No data recorded No data recorded Patient/Family Stated Goal: not stated Consulted and agree with results and recommendations: Patient; Physician Pain: Pain Assessment Pain Assessment: Faces Faces Pain Scale: 0 Pain Location: L glute near cleft Pain Descriptors / Indicators: Sore; Tender Pain Intervention(s): Monitored during session End of Session: Start Time:SLP Start Time (ACUTE ONLY): 1224 Stop Time: SLP Stop Time (ACUTE ONLY): 1245 Time Calculation:SLP Time Calculation (min) (ACUTE ONLY): 21 min Charges: SLP Evaluations $ SLP Speech Visit: 1 Visit SLP Evaluations $BSS Swallow: 1 Procedure $MBS Swallow: 1 Procedure SLP visit diagnosis: SLP Visit Diagnosis: Dysphagia, oropharyngeal phase (R13.12) Past Medical History: Past Medical History: Diagnosis Date  Coronary artery disease   Diverticulitis   NSTEMI (non-ST elevated myocardial infarction) (HCC) 08/09/2018 Past Surgical History: Past Surgical History: Procedure Laterality Date  CORONARY STENT INTERVENTION  08/10/2018  CORONARY STENT INTERVENTION N/A 08/10/2018  Procedure: CORONARY STENT INTERVENTION;  Surgeon: Jordan, Peter M, MD;  Location: [facility] INVASIVE CV LAB;  Service: Cardiovascular;  Laterality: N/A;  LAPAROSCOPIC LYSIS OF ADHESIONS  06/29/2024  Procedure: LYSIS, ADHESIONS, LAPAROSCOPIC;  Surgeon: Rubin Calamity, MD;  Location: [facility] OR;  Service: General;;  LAPAROSCOPY  06/29/2024  Procedure: LAPAROSCOPY, DIAGNOSTIC;  Surgeon: Rubin Calamity, MD;  Location: [facility] OR;  Service: General;;  LEFT HEART CATH AND CORONARY ANGIOGRAPHY N/A 08/10/2018  Procedure: LEFT  HEART CATH AND CORONARY ANGIOGRAPHY;  Surgeon: Jordan, Peter M, MD;  Location: [facility] INVASIVE CV LAB;  Service: Cardiovascular;  Laterality: N/A; Damien Blumenthal, M.A., CCC-SLP Speech Language Pathology, Acute Rehabilitation Services Secure Chat preferred [phone] 07/03/2024, 2:14 PM  DG CHEST PORT 1 VIEW Result Date: 07/02/2024 CLINICAL DATA:  Atelectasis EXAM: PORTABLE CHEST 1 VIEW COMPARISON:  06/27/2024, 03/22/2024 FINDINGS: Platelike atelectasis in the right lower lung. Streaky atelectasis and or pneumonia in the left base with small pleural effusions. Stable cardiomediastinal silhouette with aortic atherosclerosis. IMPRESSION: Streaky atelectasis and or pneumonia in the left base with small pleural effusions, slightly increased. Platelike atelectasis in the right lower lung. Electronically Signed   By: Luke Bun M.D.   On: 07/02/2024 23:35   DG Abd 1 View Result Date: 07/01/2024 CLINICAL DATA:  Bowel obstruction. EXAM: ABDOMEN - 1 VIEW COMPARISON:  06/28/2024 FINDINGS: Diffuse gaseous small bowel dilatation is again noted through the abdomen and pelvis with small bowel loops in the left upper quadrant measuring up the 3.3 cm diameter. Gas and opacified stool is seen along the length of the nondilated colon down to the rectum. NG tube has been removed in the interval. IMPRESSION: Persistent diffuse gaseous small bowel dilatation with gas and stool in the nondilated colon. No substantial interval change. Underlying component of small-bowel obstruction or diffuse ileus would be considerations. Electronically Signed   By: Camellia Candle M.D.   On: 07/01/2024 08:15   DG Abd 1 View Result Date: 06/28/2024 EXAM: 1 VIEW XRAY OF THE ABDOMEN 06/28/2024 07:25:00 PM COMPARISON: 06/28/2024 CLINICAL HISTORY: Encounter for nasogastric (NG) tube placement FINDINGS: LINES, TUBES AND DEVICES: NG tube in  place with tip overlying the upper stomach. Side port of NG tube 2.5 cm distal to expected location of GE junction.  BOWEL: Dilated small bowel loops again noted. SOFT TISSUES: No abnormal calcifications. BONES: No acute fracture. PLEURAL SPACES: Small bilateral pleural effusions and bibasilar atelectasis. IMPRESSION: 1. NG tube in place with tip overlying the upper stomach and side port 2.5 cm distal to the expected location of the GE junction. 2. Dilated small bowel loops. 3. Small bilateral pleural effusions and bibasilar atelectasis. Electronically signed by: Elsie Gravely MD 06/28/2024 07:31 PM EST RP Workstation: [id number]   DG Abd Portable 1V Result Date: 06/28/2024 CLINICAL DATA:  Small bowel obstruction. EXAM: PORTABLE ABDOMEN - 1 VIEW COMPARISON:  06/27/2024 FINDINGS: Persistent dilated loops of small bowel. The degree of small bowel distension has minimally changed. Again noted is oral contrast within the sigmoid colon, right colon and transverse colon. Nasogastric tube tip is near the gastric fundus region. Stomach appears to be decompressed. Patchy densities at the lung bases could represent atelectasis. Cannot exclude small pleural effusions. IMPRESSION: 1. Persistent dilated loops of small bowel. Findings are compatible with a small bowel obstruction. Small bowel distension has not significantly changed. 2. Nasogastric tube tip is near the gastric fundus region. Electronically Signed   By: Juliene Balder M.D.   On: 06/28/2024 11:54   DG Abd 1 View Result Date: 06/27/2024 EXAM: 1 VIEW XRAY OF THE ABDOMEN 06/27/2024 06:45:00 PM COMPARISON: 06/27/2024 CLINICAL HISTORY: Encounter for nasogastric (NG) tube placement FINDINGS: LINES, TUBES AND DEVICES: Enteric tube in place with tip and side port overlying the expected region of the gastric lumen. BOWEL: Multiple dilated loops of small bowel in the visualized upper abdomen consistent with possible small-bowel obstruction. SOFT TISSUES: No abnormal calcifications. BONES: No acute fracture. LUNGS AND PLEURAL SPACES: Trace left pleural effusion. Bibasilar atelectasis.  IMPRESSION: 1. Enteric tube in place with tip and side port overlying the expected region of the gastric lumen. 2. Possible small-bowel obstruction. 3. Trace left pleural effusion with bibasilar atelectasis. Electronically signed by: Greig Pique MD MD 06/27/2024 08:11 PM EST RP Workstation: [id number]   DG CHEST PORT 1 VIEW Result Date: 06/27/2024 EXAM: 1 VIEW(S) XRAY OF THE CHEST 06/27/2024 06:45:00 PM COMPARISON: 03/22/2024 CLINICAL HISTORY: 200810 Hypoxic 200810 FINDINGS: LINES, TUBES AND DEVICES: Enteric tube in place with distal tip beyond the inferior margin of the film. LUNGS AND PLEURA: Low lung volumes. Bibasilar airspace opacities, could represent atelectasis or pneumonia. No pleural effusion. No pneumothorax. HEART AND MEDIASTINUM: Aortic atherosclerosis. No acute abnormality of the cardiac and mediastinal silhouettes. BONES AND SOFT TISSUES: No acute osseous abnormality. IMPRESSION: 1. Bibasilar airspace opacities, favored atelectasis though pneumonia is not excluded. 2. Enteric tube tip projects beyond the inferior margin of the film. Electronically signed by: Greig Pique MD MD 06/27/2024 08:10 PM EST RP Workstation: [id number]   DG Abd Portable 1V Result Date: 06/27/2024 CLINICAL DATA:  Small bowel obstruction. EXAM: PORTABLE ABDOMEN - 1 VIEW COMPARISON:  06/26/2024 FINDINGS: Stomach is markedly distended with gas. There is marked gaseous distention of small bowel measuring up to 5.5 cm diameter, similar to mildly progressive in the interval. Gas is visible in the rectum with gas and stool seen scattered along the length of a nondistended colon. IMPRESSION: Marked gaseous distention of the stomach and small bowel, similar to mildly progressive in the interval. Imaging features remain compatible with small bowel obstruction. Electronically Signed   By: Camellia Candle M.D.   On: 06/27/2024 08:10   DG Abd  Portable 1V Result Date: 06/26/2024 EXAM: 1 VIEW XRAY OF THE ABDOMEN 06/26/2024 06:03:00 AM  COMPARISON: 06/25/2024 CLINICAL HISTORY: SBO (small bowel obstruction) (HCC) FINDINGS: LINES, TUBES AND DEVICES: Esophagogastric tube terminates in the stomach. BOWEL: Dilation of multiple small bowel loops in the abdomen, unchanged from prior study, consistent with small bowel obstruction. Maximal small bowel diameter 4.9 cm. SOFT TISSUES: No abnormal calcifications. BONES: No acute fracture. 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 IMPRESSION: 1. Unchanged appearance of the high grade small bowel obstruction. 2. Similarly positioned esophagogastric tube terminating in the stomach. Electronically signed by: Rogelia Myers MD 06/26/2024 08:38 AM EST RP Workstation: [id number]   DG Abd Portable 1V Result Date: 06/25/2024 CLINICAL DATA:  Follow-up small bowel obstruction. EXAM: DG ABD PORTABLE 1V COMPARISON:  06/24/2024.  Abdomen and pelvis CT dated 06/22/2024. FINDINGS: Multiple dilated small bowel loops are again demonstrated with mild improvement. Decreased gaseous distention of the stomach. Nasogastric tube tip and side hole in the proximal stomach. Minimal gas and stool is again demonstrated in the right colon, normal in caliber. Thoracic and lumbar spine degenerative changes. IMPRESSION: Mildly improved small bowel obstruction. Electronically Signed   By: Elspeth Bathe M.D.   On: 06/25/2024 17:49   DG Abd Portable 1V-Small Bowel Obstruction Protocol-initial, 8 hr delay Result Date: 06/24/2024 EXAM: 1 VIEW XRAY OF THE ABDOMEN 06/24/2024 10:11:00 PM COMPARISON: Comparison with 06/24/2024. CLINICAL HISTORY: FINDINGS: LINES, TUBES AND DEVICES: The endotracheal tube is present with the tip projecting over the left upper quadrant, consistent with its location in the body of the stomach. BOWEL: Mid abdominal gas-filled distended small bowel similar to prior study. Appearance is consistent with small bowel obstruction. No improvement since prior study. Paucity of gas in the colon. SOFT TISSUES:  No abnormal calcifications. No radiopaque stones. BONES: Degenerative changes in the spine. No acute fracture. IMPRESSION: 1. Endotracheal tube tip projects over the stomach, compatible with esophageal intubation. 2. Small bowel obstruction, no improvement since prior study. Electronically signed by: Elsie Gravely MD 06/24/2024 10:23 PM EST RP Workstation: [id number]   DG Abd Portable 1V Result Date: 06/24/2024 EXAM: 1 VIEW XRAY OF THE ABDOMEN 06/24/2024 04:38:00 AM COMPARISON: 06/23/2024 CLINICAL HISTORY: SBO (small bowel obstruction) (HCC) FINDINGS: LINES, TUBES AND DEVICES: Enteric tube in place with tip and side port projecting over the stomach. BOWEL: Marked gaseous distension of several loops of small bowel in the central abdomen, up to 4.9 cm in diameter, increased from prior, consistent with small bowel obstruction. Mild gaseous distension of the stomach. SOFT TISSUES: No abnormal calcifications. BONES: No acute fracture. LUNGS AND PLEURAL SPACES: Mild bibasilar atelectasis. Bilateral pleural effusions. IMPRESSION: 1. Marked gaseous distension of several loops of small bowel in the central abdomen, up to 4.9 cm in diameter, increased from prior, consistent with small bowel obstruction. 2. Mild gaseous distension of the stomach. Electronically signed by: Waddell Calk MD 06/24/2024 07:44 AM EST RP Workstation: [id number]   DG Abd Portable 1V-Small Bowel Obstruction Protocol-initial, 8 hr delay Result Date: 06/23/2024 EXAM: 1 VIEW XRAY OF THE ABDOMEN 06/23/2024 04:51:55 AM COMPARISON: 06/22/2024 CLINICAL HISTORY: FINDINGS: LINES, TUBES AND DEVICES: Enteric tube partially included in the field of view, tip in stomach. BOWEL: Dilute contrast within persistently dilated small bowel. SOFT TISSUES: Vascular calcifications. BONES: No acute fracture. IMPRESSION: 1. Persistently dilated small bowel with dilute contrast, concerning for ongoing small bowel obstruction. 2. Enteric tube tip projects over the  stomach. Electronically signed by: Waddell Calk MD 06/23/2024 05:19 AM EST RP Workstation: [id number]  DG Abd Portable 1 View Result Date: 06/22/2024 EXAM: 1 VIEW XRAY OF THE ABDOMEN 06/22/2024 02:27:00 PM COMPARISON: CT abdomen and pelvis 06/22/2024, chest x-ray 03/22/2024 CLINICAL HISTORY: NG tube Placement FINDINGS: LINES, TUBES AND DEVICES: Enteric tube in place with tip and side-port in the stomach. BOWEL: Nonobstructive bowel gas pattern. SOFT TISSUES: Atherosclerotic plaque. BONES: No acute fracture. LUNGS: Blunting of bilateral costophrenic angles due to paracardial fat and scarring. No pleural effusion noted on the CT abdomen and pelvis. Lung apices collimated off view. IMPRESSION: 1. Enteric tube in place. 2. Blunting of bilateral costophrenic angles due to paracardial fat and scarring. No pleural effusion noted on the CT abdomen and pelvis. Electronically signed by: Morgane Naveau MD 06/22/2024 03:30 PM EST RP Workstation: [id number]   CT ABDOMEN PELVIS W CONTRAST Result Date: 06/22/2024 CLINICAL DATA:  Bowel obstruction suspected EXAM: CT ABDOMEN AND PELVIS WITH CONTRAST TECHNIQUE: Multidetector CT imaging of the abdomen and pelvis was performed using the standard protocol following bolus administration of intravenous contrast. RADIATION DOSE REDUCTION: This exam was performed according to the departmental dose-optimization program which includes automated exposure control, adjustment of the mA and/or kV according to patient size and/or use of iterative reconstruction technique. CONTRAST:  75mL OMNIPAQUE  IOHEXOL  350 MG/ML SOLN COMPARISON:  July 06, 2016 FINDINGS: Lower chest: No acute abnormality. Hepatobiliary: No cholelithiasis or biliary dilatation is noted. Stable right hepatic cyst. Pancreas: Unremarkable. No pancreatic ductal dilatation or surrounding inflammatory changes. Spleen: Normal in size without focal abnormality. Adrenals/Urinary Tract: Adrenal glands appear normal. Nonobstructive  renal cyst is noted. No hydronephrosis or renal obstruction is noted. Urinary bladder is unremarkable. Stomach/Bowel: Stomach and appendix are unremarkable. Severe proximal small bowel dilatation is noted with transition zone seen in central portion of abdomen on image number 34 series 2, consistent with small bowel obstruction, potentially due to adhesion. Sigmoid diverticulosis without inflammation. Stool is noted in the nondilated colon. Vascular/Lymphatic: Aortic atherosclerosis. No enlarged abdominal or pelvic lymph nodes. Reproductive: Mild prostatic enlargement. Other: Small fat and fluid-filled left inguinal hernia is noted. No ascites is noted. Musculoskeletal: No acute or significant osseous findings. IMPRESSION: 1. Severe proximal small bowel dilatation is noted with transition zone seen in central portion of abdomen, consistent with small bowel obstruction, potentially due to adhesion. 2. Sigmoid diverticulosis without inflammation. 3. Mild prostatic enlargement. 4. Small fat and fluid-filled left inguinal hernia. 5. Aortic atherosclerosis. Aortic Atherosclerosis (ICD10-I70.0). Electronically Signed   By: Lynwood Landy Raddle M.D.   On: 06/22/2024 11:21   (Echo, Carotid, EGD, Colonoscopy, ERCP)    Subjective: No complaints feels great  Discharge Exam: Vitals:   07/05/24 0342 07/05/24 0840  BP: (!) 156/66 135/67  Pulse: 63 70  Resp: 18 18  Temp: 97.7 F (36.5 C) 97.7 F (36.5 C)  SpO2: 95% 96%   Vitals:   07/04/24 1557 07/04/24 2047 07/05/24 0342 07/05/24 0840  BP: 138/71 (!) 140/73 (!) 156/66 135/67  Pulse: 63 67 63 70  Resp: 20 20 18 18   Temp: 97.9 F (36.6 C) 97.9 F (36.6 C) 97.7 F (36.5 C) 97.7 F (36.5 C)  TempSrc: Axillary Axillary Axillary Axillary  SpO2: 95% 95% 95% 96%  Weight:      Height:        General: Pt is alert, awake, not in acute distress Cardiovascular: RRR, S1/S2 +, no rubs, no gallops Respiratory: CTA bilaterally, no wheezing, no rhonchi Abdominal:  Soft, NT, ND, bowel sounds + Extremities: no edema, no cyanosis    The results of significant  diagnostics from this hospitalization (including imaging, microbiology, ancillary and laboratory) are listed below for reference.     Microbiology: No results found for this or any previous visit (from the past 240 hours).   Labs: BNP (last 3 results) No results for input(s): BNP in the last 8760 hours. Basic Metabolic Panel: Recent Labs  Lab 06/30/24 1009 07/01/24 0543 07/02/24 1019 07/03/24 0512 07/04/24 0528  NA 140 137 137 137 137  K 4.4 4.4 4.0 4.0 4.4  CL 109 107 106 105 104  CO2 19* 21* 22 25 24   GLUCOSE 93 96 109* 87 94  BUN 39* 32* 21 16 14   CREATININE 0.93 0.80 0.95 0.83 0.88  CALCIUM  8.5* 7.8* 8.0* 7.8* 8.3*  MG 2.3 2.0 1.8 2.0 2.0  PHOS 2.9 1.8* 2.0* 2.2* 2.5   Liver Function Tests: Recent Labs  Lab 06/30/24 1009 07/01/24 0543 07/02/24 1019 07/03/24 0512 07/04/24 0528  AST 23 28 46* 52* 58*  ALT 20 18 44 69* 95*  ALKPHOS 50 47 51 52 55  BILITOT 0.4 0.4 0.4 0.6 0.4  PROT 6.1* 5.2* 5.2* 5.2* 5.3*  ALBUMIN  3.0* 2.6* 2.6* 2.7* 2.6*   No results for input(s): LIPASE, AMYLASE in the last 168 hours. No results for input(s): AMMONIA in the last 168 hours. CBC: Recent Labs  Lab 07/01/24 0543 07/01/24 1646 07/02/24 1019 07/03/24 0512 07/04/24 0528  WBC 15.4* 18.0* 16.6* 13.1* 15.0*  NEUTROABS 12.6* 14.2* 13.9* 9.8* 11.3*  HGB 13.6 15.0 14.2 13.7 13.7  HCT 40.7 45.0 41.6 39.5 39.9  MCV 92.9 92.8 91.6 89.6 91.1  PLT 198 198 202 205 208   Cardiac Enzymes: No results for input(s): CKTOTAL, CKMB, CKMBINDEX, TROPONINI in the last 168 hours. BNP: Invalid input(s): POCBNP CBG: No results for input(s): GLUCAP in the last 168 hours. D-Dimer No results for input(s): DDIMER in the last 72 hours. Hgb A1c No results for input(s): HGBA1C in the last 72 hours. Lipid Profile No results for input(s): CHOL, HDL, LDLCALC, TRIG,  CHOLHDL, LDLDIRECT in the last 72 hours. Thyroid  function studies No results for input(s): TSH, T4TOTAL, T3FREE, THYROIDAB in the last 72 hours.  Invalid input(s): FREET3 Anemia work up No results for input(s): VITAMINB12, FOLATE, FERRITIN, TIBC, IRON, RETICCTPCT in the last 72 hours. Urinalysis No results found for: COLORURINE, APPEARANCEUR, LABSPEC, PHURINE, GLUCOSEU, HGBUR, BILIRUBINUR, KETONESUR, PROTEINUR, UROBILINOGEN, NITRITE, LEUKOCYTESUR Sepsis Labs Recent Labs  Lab 07/01/24 1646 07/02/24 1019 07/03/24 0512 07/04/24 0528  WBC 18.0* 16.6* 13.1* 15.0*   Microbiology No results found for this or any previous visit (from the past 240 hours).   Time coordinating discharge: Over 30 minutes  SIGNED:   Erle Odell Castor, MD  [facility] 07/05/2024, 9:41 AM Pager   If 7PM-7AM, please contact night-coverage [email or web address] Password [email or web address]     [1]  Allergies Allergen Reactions   Bee Venom Hives and Anaphylaxis   Erythromycin Rash   Tetracyclines & Related Rash

## 2024-07-05 NOTE — Plan of Care (Signed)

## 2024-07-05 NOTE — Progress Notes (Signed)
" ° °  Inpatient Rehabilitation Admissions Coordinator   Insurance has denied CIR admit after peer to peer with Rehab MD and [facility] Medicare MD. He is CGA assist and not in need of CIR level rehab at [facility] time. I met with patient at bedside and spoke with his daughter, Jon, by phone. Jon would like HH therapy follow up. I will alert acute team and TOC. We will sign off.  Heron Leavell, RN, MSN Rehab Admissions Coordinator [phone] 07/05/2024 8:41 AM  "

## 2024-07-09 ENCOUNTER — Ambulatory Visit: Admitting: Pulmonary Disease

## 2024-07-26 ENCOUNTER — Other Ambulatory Visit: Payer: Self-pay | Admitting: Cardiology

## 2024-08-22 ENCOUNTER — Ambulatory Visit: Admitting: Pulmonary Disease
